# Patient Record
Sex: Female | Born: 1950
Health system: Southern US, Community
[De-identification: ages and names within clinical notes are randomized; demographics above are authoritative.]

## PROBLEM LIST (undated history)

## (undated) DIAGNOSIS — K5792 Diverticulitis of intestine, part unspecified, without perforation or abscess without bleeding: Secondary | ICD-10-CM

## (undated) DIAGNOSIS — R0602 Shortness of breath: Secondary | ICD-10-CM

## (undated) DIAGNOSIS — M79672 Pain in left foot: Secondary | ICD-10-CM

## (undated) DIAGNOSIS — Z86718 Personal history of other venous thrombosis and embolism: Secondary | ICD-10-CM

## (undated) DIAGNOSIS — E559 Vitamin D deficiency, unspecified: Secondary | ICD-10-CM

## (undated) DIAGNOSIS — Z923 Personal history of irradiation: Secondary | ICD-10-CM

## (undated) DIAGNOSIS — D649 Anemia, unspecified: Secondary | ICD-10-CM

## (undated) DIAGNOSIS — K5900 Constipation, unspecified: Secondary | ICD-10-CM

## (undated) DIAGNOSIS — Z87440 Personal history of urinary (tract) infections: Secondary | ICD-10-CM

## (undated) DIAGNOSIS — Z8742 Personal history of other diseases of the female genital tract: Secondary | ICD-10-CM

## (undated) DIAGNOSIS — I071 Rheumatic tricuspid insufficiency: Secondary | ICD-10-CM

## (undated) DIAGNOSIS — O2230 Deep phlebothrombosis in pregnancy, unspecified trimester: Secondary | ICD-10-CM

## (undated) DIAGNOSIS — I351 Nonrheumatic aortic (valve) insufficiency: Secondary | ICD-10-CM

## (undated) DIAGNOSIS — M5136 Other intervertebral disc degeneration, lumbar region: Secondary | ICD-10-CM

## (undated) DIAGNOSIS — I5189 Other ill-defined heart diseases: Principal | ICD-10-CM

## (undated) DIAGNOSIS — E669 Obesity, unspecified: Secondary | ICD-10-CM

## (undated) DIAGNOSIS — M1990 Unspecified osteoarthritis, unspecified site: Secondary | ICD-10-CM

## (undated) DIAGNOSIS — G8929 Other chronic pain: Secondary | ICD-10-CM

## (undated) DIAGNOSIS — M2550 Pain in unspecified joint: Secondary | ICD-10-CM

## (undated) DIAGNOSIS — M25569 Pain in unspecified knee: Secondary | ICD-10-CM

## (undated) DIAGNOSIS — M79671 Pain in right foot: Secondary | ICD-10-CM

## (undated) DIAGNOSIS — I340 Nonrheumatic mitral (valve) insufficiency: Secondary | ICD-10-CM

## (undated) DIAGNOSIS — C50919 Malignant neoplasm of unspecified site of unspecified female breast: Secondary | ICD-10-CM

## (undated) DIAGNOSIS — K829 Disease of gallbladder, unspecified: Secondary | ICD-10-CM

## (undated) DIAGNOSIS — R7303 Prediabetes: Secondary | ICD-10-CM

## (undated) HISTORY — DX: Malignant neoplasm of unspecified site of unspecified female breast: C50.919

## (undated) HISTORY — DX: Disease of gallbladder, unspecified: K82.9

## (undated) HISTORY — DX: Pain in right foot: M79.672

## (undated) HISTORY — DX: Pain in unspecified knee: M25.569

## (undated) HISTORY — DX: Shortness of breath: R06.02

## (undated) HISTORY — PX: COLONOSCOPY: SHX174

## (undated) HISTORY — DX: Vitamin D deficiency, unspecified: E55.9

## (undated) HISTORY — DX: Prediabetes: R73.03

## (undated) HISTORY — DX: Pain in right foot: M79.671

## (undated) HISTORY — DX: Other ill-defined heart diseases: I51.89

## (undated) HISTORY — DX: Constipation, unspecified: K59.00

## (undated) HISTORY — DX: Personal history of other venous thrombosis and embolism: Z86.718

## (undated) HISTORY — DX: Pain in unspecified joint: M25.50

---

## 1975-07-27 HISTORY — PX: HERNIA REPAIR: SHX51

## 1985-07-26 HISTORY — PX: OTHER SURGICAL HISTORY: SHX169

## 1985-07-26 HISTORY — PX: ABDOMINAL HYSTERECTOMY: SHX81

## 1991-07-27 HISTORY — PX: CHOLECYSTECTOMY: SHX55

## 1997-07-23 DIAGNOSIS — C50919 Malignant neoplasm of unspecified site of unspecified female breast: Secondary | ICD-10-CM | POA: Insufficient documentation

## 1997-07-23 HISTORY — DX: Malignant neoplasm of unspecified site of unspecified female breast: C50.919

## 1997-07-26 HISTORY — PX: BREAST SURGERY: SHX581

## 1997-10-24 ENCOUNTER — Encounter: Admission: RE | Admit: 1997-10-24 | Discharge: 1998-01-22 | Payer: Self-pay | Admitting: Radiation Oncology

## 1997-11-06 ENCOUNTER — Other Ambulatory Visit: Admission: RE | Admit: 1997-11-06 | Discharge: 1997-11-06 | Payer: Self-pay | Admitting: Obstetrics and Gynecology

## 1998-12-22 ENCOUNTER — Other Ambulatory Visit: Admission: RE | Admit: 1998-12-22 | Discharge: 1998-12-22 | Payer: Self-pay | Admitting: Obstetrics and Gynecology

## 1999-11-17 ENCOUNTER — Other Ambulatory Visit: Admission: RE | Admit: 1999-11-17 | Discharge: 1999-11-17 | Payer: Self-pay | Admitting: Obstetrics and Gynecology

## 2000-03-31 ENCOUNTER — Ambulatory Visit (HOSPITAL_COMMUNITY): Admission: RE | Admit: 2000-03-31 | Discharge: 2000-03-31 | Payer: Self-pay | Admitting: Gastroenterology

## 2000-11-23 ENCOUNTER — Other Ambulatory Visit: Admission: RE | Admit: 2000-11-23 | Discharge: 2000-11-23 | Payer: Self-pay | Admitting: Obstetrics and Gynecology

## 2000-11-24 ENCOUNTER — Encounter: Payer: Self-pay | Admitting: Obstetrics and Gynecology

## 2000-11-24 ENCOUNTER — Encounter: Admission: RE | Admit: 2000-11-24 | Discharge: 2000-11-24 | Payer: Self-pay | Admitting: Obstetrics and Gynecology

## 2001-05-24 ENCOUNTER — Encounter: Payer: Self-pay | Admitting: Internal Medicine

## 2001-05-24 ENCOUNTER — Encounter: Admission: RE | Admit: 2001-05-24 | Discharge: 2001-05-24 | Payer: Self-pay | Admitting: Internal Medicine

## 2001-11-28 ENCOUNTER — Encounter: Admission: RE | Admit: 2001-11-28 | Discharge: 2001-11-28 | Payer: Self-pay | Admitting: Oncology

## 2001-11-28 ENCOUNTER — Encounter: Payer: Self-pay | Admitting: Oncology

## 2002-05-09 ENCOUNTER — Other Ambulatory Visit: Admission: RE | Admit: 2002-05-09 | Discharge: 2002-05-09 | Payer: Self-pay | Admitting: Obstetrics and Gynecology

## 2002-11-30 ENCOUNTER — Encounter: Payer: Self-pay | Admitting: Oncology

## 2002-11-30 ENCOUNTER — Encounter: Admission: RE | Admit: 2002-11-30 | Discharge: 2002-11-30 | Payer: Self-pay | Admitting: Oncology

## 2003-05-15 ENCOUNTER — Other Ambulatory Visit: Admission: RE | Admit: 2003-05-15 | Discharge: 2003-05-15 | Payer: Self-pay | Admitting: Obstetrics and Gynecology

## 2003-12-02 ENCOUNTER — Encounter: Admission: RE | Admit: 2003-12-02 | Discharge: 2003-12-02 | Payer: Self-pay | Admitting: Oncology

## 2004-12-02 ENCOUNTER — Encounter: Admission: RE | Admit: 2004-12-02 | Discharge: 2004-12-02 | Payer: Self-pay | Admitting: Obstetrics and Gynecology

## 2005-12-06 ENCOUNTER — Encounter: Admission: RE | Admit: 2005-12-06 | Discharge: 2005-12-06 | Payer: Self-pay | Admitting: Obstetrics and Gynecology

## 2006-11-07 ENCOUNTER — Encounter: Admission: RE | Admit: 2006-11-07 | Discharge: 2006-11-07 | Payer: Self-pay | Admitting: Internal Medicine

## 2006-12-08 ENCOUNTER — Encounter: Admission: RE | Admit: 2006-12-08 | Discharge: 2006-12-08 | Payer: Self-pay | Admitting: Obstetrics and Gynecology

## 2007-12-11 ENCOUNTER — Encounter: Admission: RE | Admit: 2007-12-11 | Discharge: 2007-12-11 | Payer: Self-pay | Admitting: Internal Medicine

## 2008-12-11 ENCOUNTER — Encounter: Admission: RE | Admit: 2008-12-11 | Discharge: 2008-12-11 | Payer: Self-pay | Admitting: Internal Medicine

## 2009-12-15 ENCOUNTER — Encounter: Admission: RE | Admit: 2009-12-15 | Discharge: 2009-12-15 | Payer: Self-pay | Admitting: Obstetrics and Gynecology

## 2010-08-16 ENCOUNTER — Encounter: Payer: Self-pay | Admitting: Obstetrics and Gynecology

## 2010-11-09 ENCOUNTER — Other Ambulatory Visit: Payer: Self-pay | Admitting: Internal Medicine

## 2010-11-09 DIAGNOSIS — Z1231 Encounter for screening mammogram for malignant neoplasm of breast: Secondary | ICD-10-CM

## 2010-12-17 ENCOUNTER — Ambulatory Visit: Payer: Self-pay

## 2010-12-24 ENCOUNTER — Ambulatory Visit
Admission: RE | Admit: 2010-12-24 | Discharge: 2010-12-24 | Disposition: A | Discharge status: Home / Self Care | Payer: PRIVATE HEALTH INSURANCE | Source: Ambulatory Visit | Attending: Internal Medicine | Admitting: Internal Medicine

## 2010-12-24 DIAGNOSIS — Z1231 Encounter for screening mammogram for malignant neoplasm of breast: Secondary | ICD-10-CM

## 2011-11-09 ENCOUNTER — Encounter: Payer: Self-pay | Admitting: Obstetrics and Gynecology

## 2011-11-09 ENCOUNTER — Ambulatory Visit (INDEPENDENT_AMBULATORY_CARE_PROVIDER_SITE_OTHER): Payer: PRIVATE HEALTH INSURANCE | Admitting: Obstetrics and Gynecology

## 2011-11-09 VITALS — BP 108/76 | HR 60 | Ht 65.0 in | Wt 214.0 lb

## 2011-11-09 DIAGNOSIS — N390 Urinary tract infection, site not specified: Secondary | ICD-10-CM

## 2011-11-09 NOTE — Progress Notes (Signed)
Subjective:    Melissa [name] is a 61 y.o. female [phone] who presents for annual exam.  The patient has no complaints today. She has a history of chronic UTI's but does well on prophylactic trimethoprim  The following portions of the patient's history were reviewed and updated as appropriate: allergies, current medications, past family history, past medical history, past social history, past surgical history and problem list.  Review of Systems Pertinent items are noted in HPI. Gastrointestinal:No change in bowel habits, no abdominal pain, no rectal bleeding Genitourinary:negative for dysuria, frequency, hematuria, nocturia and urinary incontinence    Objective:     BP 108/76  Pulse 60  Ht 5\' 5"  (1.651 m)  Wt 214 lb (97.07 kg)  BMI 35.61 kg/m2  Weight:  Wt Readings from Last 1 Encounters:  11/09/11 214 lb (97.07 kg)     BMI: Body mass index is 35.61 kg/(m^2). General Appearance: Alert, appropriate appearance for age. No acute distress HEENT: Grossly normal Neck / Thyroid: Supple, no masses, nodes or enlargement Lungs: clear to auscultation bilaterally Back: No CVA tenderness Breast Exam: Right without  Masses, tenderness of skin changes.  Left s/p lumpectomy and radiation Cardiovascular: Regular rate and rhythm. S1, S2, no murmur Gastrointestinal: Soft, non-tender, no masses or organomegaly Pelvic Exam: External genitalia: normal general appearance Vaginal: normal mucosa without prolapse or lesions and vaginal vault, well healed and suspended Adnexa: non palpable Rectal: no masses Exam limited by body habitus Rectovaginal: normal rectal, no masses Lymphatic Exam: Non-palpable nodes in neck, clavicular, axillary, or inguinal regions Skin: no rash or abnormalities Neurologic: Normal gait and speech, no tremor  Psychiatric: Alert and oriented, appropriate affect.    Urinalysis:Not done      Assessment:    Normal post hysterectomy exam  History of L breast cancer, now  NED   Plan:    All questions answered. Follow up in 1 year. Mammogram.   Follow-up:  for annual exam

## 2011-11-10 ENCOUNTER — Encounter: Payer: Self-pay | Admitting: Obstetrics and Gynecology

## 2011-11-23 ENCOUNTER — Other Ambulatory Visit: Payer: Self-pay | Admitting: Internal Medicine

## 2011-11-23 DIAGNOSIS — Z1231 Encounter for screening mammogram for malignant neoplasm of breast: Secondary | ICD-10-CM

## 2011-12-27 ENCOUNTER — Ambulatory Visit
Admission: RE | Admit: 2011-12-27 | Discharge: 2011-12-27 | Disposition: A | Discharge status: Home / Self Care | Payer: PRIVATE HEALTH INSURANCE | Source: Ambulatory Visit | Attending: Internal Medicine | Admitting: Internal Medicine

## 2011-12-27 DIAGNOSIS — Z1231 Encounter for screening mammogram for malignant neoplasm of breast: Secondary | ICD-10-CM

## 2011-12-30 ENCOUNTER — Other Ambulatory Visit: Payer: Self-pay | Admitting: Internal Medicine

## 2011-12-30 DIAGNOSIS — R928 Other abnormal and inconclusive findings on diagnostic imaging of breast: Secondary | ICD-10-CM

## 2012-01-04 ENCOUNTER — Ambulatory Visit
Admission: RE | Admit: 2012-01-04 | Discharge: 2012-01-04 | Disposition: A | Discharge status: Home / Self Care | Payer: PRIVATE HEALTH INSURANCE | Source: Ambulatory Visit | Attending: Internal Medicine | Admitting: Internal Medicine

## 2012-01-04 ENCOUNTER — Other Ambulatory Visit: Payer: Self-pay | Admitting: Internal Medicine

## 2012-01-04 DIAGNOSIS — R928 Other abnormal and inconclusive findings on diagnostic imaging of breast: Secondary | ICD-10-CM

## 2012-01-04 DIAGNOSIS — C50919 Malignant neoplasm of unspecified site of unspecified female breast: Secondary | ICD-10-CM | POA: Insufficient documentation

## 2012-01-04 HISTORY — PX: BREAST BIOPSY: SHX20

## 2012-01-04 HISTORY — DX: Malignant neoplasm of unspecified site of unspecified female breast: C50.919

## 2012-01-05 ENCOUNTER — Other Ambulatory Visit: Payer: Self-pay | Admitting: Internal Medicine

## 2012-01-05 ENCOUNTER — Telehealth (INDEPENDENT_AMBULATORY_CARE_PROVIDER_SITE_OTHER): Payer: Self-pay | Admitting: General Surgery

## 2012-01-05 DIAGNOSIS — C50912 Malignant neoplasm of unspecified site of left female breast: Secondary | ICD-10-CM

## 2012-01-05 NOTE — Telephone Encounter (Signed)
Per Dr Jamey Ripa, can see patient on Friday. I called Leigh at [facility] and left message for her to call me back. Wanted to make sure she new diagnosis. MR not scheduled until 01/14/12.

## 2012-01-05 NOTE — Telephone Encounter (Signed)
Do you have anywhere you would like to see this patient sooner?

## 2012-01-05 NOTE — Telephone Encounter (Signed)
Message copied by Liliana Cline on Wed Jan 05, 2012 11:19 AM ------      Message from: Marnette Burgess      Created: Wed Jan 05, 2012 10:22 AM      Contact: [phone]       Patient has an new br ca appt on 01/25/12, can patient be seen sooner, if so, please call pt.

## 2012-01-06 NOTE — Telephone Encounter (Signed)
Appt moved , spoke with patient.

## 2012-01-07 ENCOUNTER — Ambulatory Visit (INDEPENDENT_AMBULATORY_CARE_PROVIDER_SITE_OTHER): Payer: PRIVATE HEALTH INSURANCE | Admitting: Surgery

## 2012-01-07 ENCOUNTER — Other Ambulatory Visit: Payer: Self-pay | Admitting: *Deleted

## 2012-01-07 ENCOUNTER — Encounter (INDEPENDENT_AMBULATORY_CARE_PROVIDER_SITE_OTHER): Payer: Self-pay | Admitting: Surgery

## 2012-01-07 VITALS — BP 94/58 | HR 59 | Temp 97.0°F | Ht 66.0 in | Wt 211.6 lb

## 2012-01-07 DIAGNOSIS — C50919 Malignant neoplasm of unspecified site of unspecified female breast: Secondary | ICD-10-CM

## 2012-01-07 DIAGNOSIS — C50912 Malignant neoplasm of unspecified site of left female breast: Secondary | ICD-10-CM | POA: Insufficient documentation

## 2012-01-07 NOTE — Progress Notes (Signed)
NAMETIONDRA FANG DOB: [birth year] MRN: [id number]                                                                                      DATE: 01/07/2012  PCP: Pearla Dubonnet, MD Referring Provider: Pearla Dubonnet, MD  IMPRESSION:  Breast cancer, left, upper outer quadrant, recurrent,receptors pending.  PLAN:   She is having an MRI next week. Her original cancer was 14 years ago, at age about 34. Treated with lumpectomy and radiation,so she likely will need mastectomy, but MRI pending and she will need genetic testing as well.Will also arrange plastic surgery eval                 CC:  Chief Complaint  Patient presents with  . Breast Cancer    left, recurrent    HPI:  Melissa [name] is a 61 y.o.  female who presents for evaluation of Newly diagnosed recurrent IDC, left breast, just below lumpectomy scar. Found on mammogram, not apparent to the patient  PMH:  has a past medical history of Cancer.  PSH:   has past surgical history that includes Cesarean section; Abdominal hysterectomy; Hernia repair (1977); Cholecystectomy (1993); Breast surgery (1999); and ovary removed (1987).  ALLERGIES:  No Known Allergies  MEDICATIONS: Current outpatient prescriptions:Multiple Vitamin (MULITIVITAMIN WITH MINERALS) TABS, Take 1 tablet by mouth daily., Disp: , Rfl: ;  trimethoprim (TRIMPEX) 100 MG tablet, Take 100 mg by mouth 2 (two) times daily., Disp: , Rfl:   ROS: She has filled out our 12 point review of systems and it is negative.  EXAM:   VS: BP 94/58  Pulse 59  Temp 97 F (36.1 C) (Temporal)  Ht 5\' 6"  (1.676 m)  Wt 211 lb 9.6 oz (95.981 kg)  BMI 34.15 kg/m2  SpO2 98% Gen: Alert, oreinted, NAD Breasts: Right is normal. Left shows a scar in high UOQ with an eccymosis near the medial end, just inferior to the scar. The remainder of the breast is normal.  Lympahtics: No axillary or supraclavicular adenopathy noted  DATA REVIEWED:  Path and imaging reviewed. Await old  chart     Khloei Spiker J 01/07/2012  CC: Pearla Dubonnet, MD, Pearla Dubonnet, MD

## 2012-01-07 NOTE — Patient Instructions (Signed)
We will arrange a consultation with plastic surgery, medical oncology and genetic counseling.

## 2012-01-07 NOTE — Addendum Note (Signed)
Addended byLiliana Cline on: 01/07/2012 03:48 PM   Modules accepted: Orders

## 2012-01-10 ENCOUNTER — Telehealth: Payer: Self-pay | Admitting: Oncology

## 2012-01-10 NOTE — Telephone Encounter (Signed)
S/w pt today re appt for 6/19 @ 8:45 am.

## 2012-01-12 ENCOUNTER — Telehealth: Payer: Self-pay | Admitting: *Deleted

## 2012-01-12 ENCOUNTER — Ambulatory Visit: Payer: PRIVATE HEALTH INSURANCE

## 2012-01-12 ENCOUNTER — Telehealth: Payer: Self-pay | Admitting: Oncology

## 2012-01-12 ENCOUNTER — Ambulatory Visit (HOSPITAL_BASED_OUTPATIENT_CLINIC_OR_DEPARTMENT_OTHER): Payer: PRIVATE HEALTH INSURANCE | Admitting: Oncology

## 2012-01-12 VITALS — BP 110/73 | HR 60 | Temp 97.2°F | Ht 66.0 in | Wt 215.7 lb

## 2012-01-12 DIAGNOSIS — C50912 Malignant neoplasm of unspecified site of left female breast: Secondary | ICD-10-CM

## 2012-01-12 DIAGNOSIS — C50919 Malignant neoplasm of unspecified site of unspecified female breast: Secondary | ICD-10-CM

## 2012-01-12 NOTE — Telephone Encounter (Signed)
Gv pt aptp for [id number].  scheduled pt for ct and bone scan on 06/26 @ [facility]

## 2012-01-12 NOTE — Telephone Encounter (Signed)
Left message for pt to return my call so I can schedule a genetic appt.  

## 2012-01-12 NOTE — Progress Notes (Signed)
[facility]    OFFICE PROGRESS NOTE   INTERVAL HISTORY:   [name] was last seen at the cancer Center in 2005. She reports feeling well. She had a routine screening mammogram on 12/27/2011. A possible mass was noted in the left breast. She returned on 01/04/2012 4 additional views of the left breast. A spiculated mass was noted anterior to the lumpectomy site at the 1:00 position near the left axilla. A 1 cm nodule was palpated at 1:00 just inferior to the lumpectomy scar. An ultrasound showed an irregular mass in this location measuring 1 x 0.9 x 0.9 cm. No abnormal left axillary lymph nodes were notified. She underwent ultrasound-guided core biopsy of the left breast mass on 01/05/2012. The pathology confirmed invasive ductal carcinoma and DCIS, ER 100, PR 67, Ki-67 35.  She saw Dr. Jamey [name] on 01/07/2012 and has been scheduled for a bilateral breast MRI on 01/14/2012. Dr. Jamey [name] plans to schedule a mastectomy. She is scheduled for an appointment with Dr. Kelly [name] on 01/25/2012 to discuss breast reconstruction options.  Melissa [name] feels well. She had not noted a palpable change in the left breast prior to the biopsy procedure. Following the biopsy she noted numbness and "tingling" in the left upper arm. This has persisted over the past several days. No arm weakness. She lifted weight earlier today. No neck pain.   Past medical history: 1. Chronic bladder infections-maintained on prophylactic trimethoprim 2. G3 P3, hot flashes since starting tamoxifen in January 1999, now improved 3. 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 "Phlebitis "while pregnant in 1987  Past surgical history: 1. Hysterectomy and unilateral oophorectomy in 1987 2. Cholecystectomy 3. Umbilical hernia repair  Family history: A paternal aunt had breast cancer in her 63s, her mother died of colon cancer at age 68, for a maternal aunts had colon cancer. Her maternal grandfather had prostate  cancer.  Review of systems: Positives: Intermittent urinary tract infections and hematuria or not taking trimethoprim, left arm tingling and Numbness since undergoing a needle biopsy on 01/05/2012-the tingling and numbness is in the left upper arm  Objective:  Vital signs in last 24 hours:  Blood pressure 110/73, pulse 60, temperature 97.2 F (36.2 C), temperature source Oral, height 5\' 6"  (1.676 m), weight 215 lb 11.2 oz (97.841 kg).    HEENT: Neck without mass or tenderness Lymphatics: No cervical, supraclavicular, or inguinal nodes. No right axillary nodes. Pea-sized mobile left axillary node Resp: Lungs clear bilaterally Cardio: Regular rate and rhythm GI: No hepatosplenomegaly, nontender Vascular: No leg edema Neuro: The motor exam appears intact in the left arm , alert and oriented Skin: No rash Breast: Status post left lumpectomy. There is a firm fullness in the upper-outer left breast tissue medial to the superior aspect of the lumpectomy scar. No other mass in either breast.  Muscular skeletal: No neck tenderness, examination of the left upper arm is unremarkable    Lab Results:  CBC, chemistry panel, and a CA 27.29 will be obtained in one week.  Medications: I have reviewed the patient's current medications.  Assessment/Plan:  1. Stage I (T1 N0) left-sided breast cancer diagnosed in January of 1999, ER positive, PR positive, HER-2 negative, status post a left lumpectomy, left axillary lymph node dissection, and left breast radiation. She completed 5 years of adjuvant tamoxifen therapy in January of 2004.  2. Recurrent invasive breast cancer near the left lumpectomy scar-confirmed on a needle core biopsy 01/05/2012, the pathology is consistent  with invasive breast cancer, ER positive, PR positive  Disposition:  Melissa [name] has been diagnosed with recurrent breast cancer. I discussed the diagnosis, prognosis, and treatment options with her today.  There is no clinical  or physical exam evidence of distant metastatic disease. She appears to have a local recurrence of breast cancer after breast conserving therapy. I expect her prognosis is better than the average patient with a local recurrence of breast cancer due to the long disease-free interval and the recurrence in a post lumpectomy breast.  She is scheduled for a bilateral breast MRI on 01/14/2012. She will complete a staging evaluation with CT scans of the chest, abdomen, and pelvis in addition to a bone scan. We will obtain a chemistry panel and CA 27-29 when she returns for the CT scan.  Melissa [name] will return for an office visit on 01/25/2012.  We will make a decision on systemic therapy based on the final pathology from the planned mastectomy.   Thornton Papas, MD  01/12/2012  12:52 PM

## 2012-01-14 ENCOUNTER — Other Ambulatory Visit: Payer: PRIVATE HEALTH INSURANCE

## 2012-01-14 ENCOUNTER — Ambulatory Visit
Admission: RE | Admit: 2012-01-14 | Discharge: 2012-01-14 | Disposition: A | Discharge status: Home / Self Care | Payer: PRIVATE HEALTH INSURANCE | Source: Ambulatory Visit | Attending: Internal Medicine | Admitting: Internal Medicine

## 2012-01-14 DIAGNOSIS — C50912 Malignant neoplasm of unspecified site of left female breast: Secondary | ICD-10-CM

## 2012-01-14 MED ORDER — GADOBENATE DIMEGLUMINE 529 MG/ML IV SOLN
20.0000 mL | Freq: Once | INTRAVENOUS | Status: AC | PRN
  Administered 2012-01-14: 20 mL via INTRAVENOUS

## 2012-01-19 ENCOUNTER — Encounter (HOSPITAL_COMMUNITY)
Admission: RE | Admit: 2012-01-19 | Discharge: 2012-01-19 | Disposition: A | Discharge status: Home / Self Care | Payer: PRIVATE HEALTH INSURANCE | Source: Ambulatory Visit | Attending: Oncology | Admitting: Oncology

## 2012-01-19 ENCOUNTER — Telehealth: Payer: Self-pay | Admitting: *Deleted

## 2012-01-19 ENCOUNTER — Other Ambulatory Visit (HOSPITAL_BASED_OUTPATIENT_CLINIC_OR_DEPARTMENT_OTHER): Payer: PRIVATE HEALTH INSURANCE | Admitting: Lab

## 2012-01-19 DIAGNOSIS — C50912 Malignant neoplasm of unspecified site of left female breast: Secondary | ICD-10-CM

## 2012-01-19 DIAGNOSIS — M5380 Other specified dorsopathies, site unspecified: Secondary | ICD-10-CM | POA: Insufficient documentation

## 2012-01-19 DIAGNOSIS — C50919 Malignant neoplasm of unspecified site of unspecified female breast: Secondary | ICD-10-CM | POA: Insufficient documentation

## 2012-01-19 DIAGNOSIS — K7689 Other specified diseases of liver: Secondary | ICD-10-CM | POA: Insufficient documentation

## 2012-01-19 DIAGNOSIS — C50419 Malignant neoplasm of upper-outer quadrant of unspecified female breast: Secondary | ICD-10-CM

## 2012-01-19 LAB — CBC WITH DIFFERENTIAL/PLATELET
BASO%: 0.9 % (ref 0.0–2.0)
Basophils Absolute: 0 10*3/uL (ref 0.0–0.1)
EOS%: 1.3 % (ref 0.0–7.0)
HGB: 12.7 g/dL (ref 11.6–15.9)
LYMPH%: 48 % (ref 14.0–49.7)
MCH: 27.8 pg (ref 25.1–34.0)
MCHC: 32.3 g/dL (ref 31.5–36.0)
MCV: 86.2 fL (ref 79.5–101.0)
MONO%: 7.3 % (ref 0.0–14.0)
Platelets: 223 10*3/uL (ref 145–400)
RBC: 4.57 10*6/uL (ref 3.70–5.45)
WBC: 4.8 10*3/uL (ref 3.9–10.3)
lymph#: 2.3 10*3/uL (ref 0.9–3.3)

## 2012-01-19 LAB — CMP (CANCER CENTER ONLY)
ALT(SGPT): 22 U/L (ref 10–47)
AST: 30 U/L (ref 11–38)
Alkaline Phosphatase: 79 U/L (ref 26–84)
BUN, Bld: 18 mg/dL (ref 7–22)
Chloride: 100 mEq/L (ref 98–108)
Creat: 0.9 mg/dl (ref 0.6–1.2)
Potassium: 4.4 mEq/L (ref 3.3–4.7)

## 2012-01-19 LAB — CANCER ANTIGEN 27.29: CA 27.29: 27 U/mL (ref 0–39)

## 2012-01-19 MED ORDER — IOHEXOL 300 MG/ML  SOLN
100.0000 mL | Freq: Once | INTRAMUSCULAR | Status: AC | PRN
  Administered 2012-01-19: 100 mL via INTRAVENOUS

## 2012-01-19 MED ORDER — TECHNETIUM TC 99M MEDRONATE IV KIT: 25.0000 | PACK | Freq: Once | INTRAVENOUS | Status: DC | PRN

## 2012-01-19 NOTE — Telephone Encounter (Signed)
Pt returned my call and I confirmed 03/06/12 appt w pt.  Emailed Jade at [facility] to make her aware.

## 2012-01-19 NOTE — Telephone Encounter (Signed)
Left message for pt to return my call so I can schedule a genetic appt.  

## 2012-01-20 ENCOUNTER — Telehealth: Payer: Self-pay | Admitting: Oncology

## 2012-01-20 NOTE — Telephone Encounter (Signed)
called pts r/s appt on 07/02 to 07/01 asked pt to rtn call to confirm changes

## 2012-01-21 ENCOUNTER — Telehealth: Payer: Self-pay | Admitting: Oncology

## 2012-01-21 NOTE — Telephone Encounter (Signed)
pt rtn call and confirmed appt for 07/01

## 2012-01-24 ENCOUNTER — Telehealth: Payer: Self-pay | Admitting: Oncology

## 2012-01-24 ENCOUNTER — Ambulatory Visit: Admit: 2012-01-24 | Payer: Self-pay | Admitting: Surgery

## 2012-01-24 ENCOUNTER — Ambulatory Visit (HOSPITAL_BASED_OUTPATIENT_CLINIC_OR_DEPARTMENT_OTHER): Payer: PRIVATE HEALTH INSURANCE | Admitting: Oncology

## 2012-01-24 VITALS — BP 106/65 | HR 64 | Temp 97.9°F | Ht 66.0 in | Wt 212.5 lb

## 2012-01-24 DIAGNOSIS — C50912 Malignant neoplasm of unspecified site of left female breast: Secondary | ICD-10-CM

## 2012-01-24 DIAGNOSIS — C50919 Malignant neoplasm of unspecified site of unspecified female breast: Secondary | ICD-10-CM

## 2012-01-24 SURGERY — SIMPLE MASTECTOMY
Anesthesia: General | Site: Breast | Laterality: Left

## 2012-01-24 NOTE — Telephone Encounter (Signed)
Gave pt appt for August 5th md only

## 2012-01-24 NOTE — Progress Notes (Signed)
   [facility]    OFFICE PROGRESS NOTE   INTERVAL HISTORY:   She returns as scheduled. She is scheduled to see Dr. Kelly Splinter on 01/25/2012.  Objective:  Vital signs in last 24 hours:  Blood pressure 106/65, pulse 64, temperature 97.9 F (36.6 C), temperature source Oral, height 5\' 6"  (1.676 m), weight 212 lb 8 oz (96.389 kg).    Musculoskeletal: No tenderness or mass at the sternal Breast: There are a few areas of firm nodularity in the breast tissue inferior to the lumpectomy scar, no discrete mass   Lab Results:  Lab Results  Component Value Date   WBC 4.8 01/19/2012   HGB 12.7 01/19/2012   HCT 39.4 01/19/2012   MCV 86.2 01/19/2012   PLT 223 01/19/2012   CA 27.29- 27 on 01/19/2012  X-rays: Bilateral breast MRI on 01/17/2012-1.1 x 1.3 x 1.5 cm spiculated mass for posteriorly in the upper outer quadrant of the left breast. No abnormality in the right breast. No enlarged axillary or internal mammary nodes. Well-circumscribed lesions in the liver are likely cysts.  Bone scan on 01/19/2012-focus of very subtle uptake in the inferior sternum, no bony abnormality identified at this location on the CT. Increased uptake in the right aspect of the lower thoracic spine at the T10-T11 level, corresponds to very prominent right-sided  Paravertebral spurring at T10 and T11. Subtle heterogenous uptake in the lumbar spine likely attributable to facet degeneration visible on the CT scan.   CT scans of the chest, abdomen, and pelvis on 01/19/2012-in the upper outer left breast there is a soft tissue nodule measuring 1 cm, no evidence for metastatic disease in the chest. Several fluid attenuation structure throughout the liver within both lobes likely represent cysts. No evidence of metastatic disease in the abdomen or pelvis the     Medications: I have reviewed the patient's current medications.  Assessment/Plan: 1.Stage I (T1 N0) left-sided breast cancer diagnosed in January  of 1999, ER positive, PR positive, HER-2 negative, status post a left lumpectomy, left axillary lymph node dissection, and left breast radiation. She completed 5 years of adjuvant tamoxifen therapy in January of 2004.  2. Recurrent invasive breast cancer near the left lumpectomy scar-confirmed on a needle core biopsy 01/05/2012, the pathology is consistent with invasive breast cancer, ER positive, PR positive.             -breast MRI 01/17/2012 confirmed an isolated mass in the upper outer left breast         -bone scan on 01/19/2012-negative aside from an area of very subtle uptake in the inferior sternum without a CT correlate   -staging CTs of the chest, abdomen, and pelvis on 01/19/2012-negative for metastatic disease    Disposition:  She appears to have a local recurrence of breast cancer. She will see Dr. Jamey Ripa to plan a mastectomy procedure. Ms. Plunk is scheduled to see the plastic surgeon on 01/25/2012. I reviewed the staging studies with Ms. Barbier today. She has no evidence of metastatic disease based on her history, physical exam, and imaging studies to date. I will recommend hormonal therapy to begin after the mastectomy.  She will return for an office visit on 02/28/2012. In the interim I will arrange for her case to be presented at the breast tumor conference and I will discuss her case with my oncology colleagues.   Thornton Papas, MD  01/24/2012  5:24 PM

## 2012-01-25 ENCOUNTER — Ambulatory Visit: Payer: PRIVATE HEALTH INSURANCE | Admitting: Oncology

## 2012-01-25 ENCOUNTER — Encounter (INDEPENDENT_AMBULATORY_CARE_PROVIDER_SITE_OTHER): Payer: PRIVATE HEALTH INSURANCE | Admitting: Surgery

## 2012-01-25 ENCOUNTER — Telehealth: Payer: Self-pay | Admitting: *Deleted

## 2012-01-25 NOTE — Telephone Encounter (Signed)
Left VM for Melissa [name] w/[facility] requesting to add patient to 7/3 or 7/10 Breast Conference. Called Alinda Money at [facility] and requested case [phone] be tested for Her-2 Neu per Dr. Truett Perna request (left breast biopsy).

## 2012-01-28 ENCOUNTER — Telehealth: Payer: Self-pay | Admitting: *Deleted

## 2012-01-28 NOTE — Telephone Encounter (Signed)
left voice message to inform the patient of the new date and time on 02-28-2012 at 3:45pm

## 2012-02-01 ENCOUNTER — Encounter (INDEPENDENT_AMBULATORY_CARE_PROVIDER_SITE_OTHER): Payer: Self-pay | Admitting: Surgery

## 2012-02-01 ENCOUNTER — Encounter (HOSPITAL_COMMUNITY): Payer: Self-pay | Admitting: Pharmacy Technician

## 2012-02-01 ENCOUNTER — Ambulatory Visit (INDEPENDENT_AMBULATORY_CARE_PROVIDER_SITE_OTHER): Payer: BC Managed Care – PPO | Admitting: Surgery

## 2012-02-01 VITALS — BP 112/82 | HR 70 | Temp 98.4°F | Ht 66.5 in | Wt 214.6 lb

## 2012-02-01 DIAGNOSIS — C50919 Malignant neoplasm of unspecified site of unspecified female breast: Secondary | ICD-10-CM

## 2012-02-01 DIAGNOSIS — C50912 Malignant neoplasm of unspecified site of left female breast: Secondary | ICD-10-CM

## 2012-02-01 NOTE — Progress Notes (Signed)
Chief complaint: Preop discussion prior to breast surgery  History of present illness: This patient presented a few weeks ago with a recurrence of her left breast cancer which is in the upper outer quadrant, basically in the axilla. Core biopsy had shown an invasive ductal carcinoma receptor positive. Metastatic workup has been negative and the breast MRI has shown no other lesions. She has had a plastic surgical consultation for possible reconstruction after mastectomy but has decided against having a reconstruction.  Family history view systems are all noted and not redictated here.  Exam: Gen.: Patient is alert oriented and healthy-appearing Vital signs: Breasts: She has a hard area in the lumpectomy site in the upper outer quadrant axillary area consistent with a recurrent cancer.  Data reviewed: As noted in history of present illness  Impression: Local, left breast cancer, invasive ductal, receptor positive.  Plan: We'll proceed to schedule her for a mastectomy. We did discuss at some length the surgery and the alternative of a possible repeat lumpectomy since this is so high in the axilla. However there is no data to support doing that. I think all questions been answered. We'll proceed to schedule her at her convenience hopefully fairly soon. 

## 2012-02-01 NOTE — Patient Instructions (Signed)
We will schedule surgery to remove your left breast

## 2012-02-09 ENCOUNTER — Encounter (HOSPITAL_COMMUNITY): Payer: Self-pay

## 2012-02-09 ENCOUNTER — Encounter (HOSPITAL_COMMUNITY)
Admission: RE | Admit: 2012-02-09 | Discharge: 2012-02-09 | Disposition: A | Discharge status: Home / Self Care | Payer: BC Managed Care – PPO | Source: Ambulatory Visit | Attending: Surgery | Admitting: Surgery

## 2012-02-09 HISTORY — DX: Anemia, unspecified: D64.9

## 2012-02-09 HISTORY — DX: Personal history of urinary (tract) infections: Z87.440

## 2012-02-09 LAB — CBC
Hemoglobin: 12.8 g/dL (ref 12.0–15.0)
Platelets: 215 10*3/uL (ref 150–400)
RBC: 4.54 MIL/uL (ref 3.87–5.11)
RDW: 15.4 % (ref 11.5–15.5)

## 2012-02-09 LAB — BASIC METABOLIC PANEL
BUN: 18 mg/dL (ref 6–23)
CO2: 28 mEq/L (ref 19–32)
Chloride: 104 mEq/L (ref 96–112)
Glucose, Bld: 89 mg/dL (ref 70–99)
Potassium: 4.7 mEq/L (ref 3.5–5.1)
Sodium: 140 mEq/L (ref 135–145)

## 2012-02-09 NOTE — Pre-Procedure Instructions (Signed)
21 Melissa [name]  02/09/2012   Your procedure is scheduled on:  02/14/12 (Monday)  Report to [facility] at 08:30 AM.  Call this number if you have problems the morning of surgery: [phone]   Remember:   Do not eat food:After Midnight.  May have clear liquids:until Midnight .  Clear liquids include soda, tea, black coffee, apple or grape juice, broth.  Take these medicines the morning of surgery with A SIP OF WATER: Trimethoprim    Do not wear jewelry, make-up or nail polish.  Do not wear lotions, powders, or perfumes. You may wear deodorant.  Do not shave 48 hours prior to surgery. Men may shave face and neck.  Do not bring valuables to the hospital.  Contacts, dentures or bridgework may not be worn into surgery.  Leave suitcase in the car. After surgery it may be brought to your room.  For patients admitted to the hospital, checkout time is 11:00 AM the day of discharge.   Patients discharged the day of surgery will not be allowed to drive home.  Name and phone number of your driver: Myna Freimark [phone]  Special Instructions: CHG Shower Use Special Wash: 1/2 bottle night before surgery and 1/2 bottle morning of surgery.   Please read over the following fact sheets that you were given: Pain Booklet, Coughing and Deep Breathing, MRSA Information and Surgical Site Infection Prevention

## 2012-02-13 MED ORDER — CHLORHEXIDINE GLUCONATE 4 % EX LIQD: 1.0000 "application " | Freq: Once | CUTANEOUS | Status: DC

## 2012-02-13 MED ORDER — CEFAZOLIN SODIUM-DEXTROSE 2-3 GM-% IV SOLR
2.0000 g | INTRAVENOUS | Status: DC
  Filled 2012-02-13: qty 50

## 2012-02-14 ENCOUNTER — Encounter (HOSPITAL_COMMUNITY): Payer: Self-pay | Admitting: *Deleted

## 2012-02-14 ENCOUNTER — Encounter (HOSPITAL_COMMUNITY): Payer: Self-pay | Admitting: Anesthesiology

## 2012-02-14 ENCOUNTER — Ambulatory Visit (HOSPITAL_COMMUNITY)
Admission: RE | Admit: 2012-02-14 | Discharge: 2012-02-14 | Disposition: A | Discharge status: Home / Self Care | Payer: BC Managed Care – PPO | Source: Ambulatory Visit | Attending: Surgery | Admitting: Surgery

## 2012-02-14 ENCOUNTER — Encounter (HOSPITAL_COMMUNITY)
Admission: RE | Disposition: A | Discharge status: Home / Self Care | Payer: Self-pay | Source: Ambulatory Visit | Attending: Surgery

## 2012-02-14 DIAGNOSIS — Z01812 Encounter for preprocedural laboratory examination: Secondary | ICD-10-CM | POA: Insufficient documentation

## 2012-02-14 DIAGNOSIS — C50419 Malignant neoplasm of upper-outer quadrant of unspecified female breast: Secondary | ICD-10-CM | POA: Insufficient documentation

## 2012-02-14 DIAGNOSIS — D0590 Unspecified type of carcinoma in situ of unspecified breast: Secondary | ICD-10-CM

## 2012-02-14 HISTORY — PX: BREAST LUMPECTOMY: SHX2

## 2012-02-14 SURGERY — BREAST LUMPECTOMY
Anesthesia: General | Site: Breast | Laterality: Left | Wound class: Clean

## 2012-02-14 MED ORDER — LACTATED RINGERS IV SOLN
INTRAVENOUS | Status: DC | PRN
  Administered 2012-02-14: 11:00:00 via INTRAVENOUS

## 2012-02-14 MED ORDER — HYDROMORPHONE HCL PF 1 MG/ML IJ SOLN: 0.2500 mg | INTRAMUSCULAR | Status: DC | PRN

## 2012-02-14 MED ORDER — BUPIVACAINE HCL (PF) 0.25 % IJ SOLN
INTRAMUSCULAR | Status: AC
  Filled 2012-02-14: qty 30

## 2012-02-14 MED ORDER — MIDAZOLAM HCL 2 MG/2ML IJ SOLN: 0.5000 mg | Freq: Once | INTRAMUSCULAR | Status: DC | PRN

## 2012-02-14 MED ORDER — LACTATED RINGERS IV SOLN
INTRAVENOUS | Status: DC
  Administered 2012-02-14: 10:00:00 via INTRAVENOUS

## 2012-02-14 MED ORDER — PROMETHAZINE HCL 25 MG/ML IJ SOLN: 6.2500 mg | INTRAMUSCULAR | Status: DC | PRN

## 2012-02-14 MED ORDER — OXYCODONE-ACETAMINOPHEN 5-325 MG PO TABS: 1.0000 | ORAL_TABLET | ORAL | Status: DC | PRN

## 2012-02-14 MED ORDER — MEPERIDINE HCL 25 MG/ML IJ SOLN: 6.2500 mg | INTRAMUSCULAR | Status: DC | PRN

## 2012-02-14 MED ORDER — BUPIVACAINE HCL (PF) 0.25 % IJ SOLN
INTRAMUSCULAR | Status: DC | PRN
  Administered 2012-02-14: 20 mL

## 2012-02-14 MED ORDER — 0.9 % SODIUM CHLORIDE (POUR BTL) OPTIME
TOPICAL | Status: DC | PRN
  Administered 2012-02-14: 1000 mL

## 2012-02-14 MED ORDER — OXYCODONE-ACETAMINOPHEN 5-325 MG PO TABS: 1.0000 | ORAL_TABLET | ORAL | Status: AC | PRN

## 2012-02-14 SURGICAL SUPPLY — 57 items
APPLIER CLIP 9.375 MED OPEN (MISCELLANEOUS)
APPLIER CLIP 9.375 SM OPEN (CLIP) ×3
BINDER BREAST LRG (GAUZE/BANDAGES/DRESSINGS) IMPLANT
BINDER BREAST XLRG (GAUZE/BANDAGES/DRESSINGS) IMPLANT
CANISTER SUCTION 2500CC (MISCELLANEOUS) ×3 IMPLANT
CHLORAPREP W/TINT 26ML (MISCELLANEOUS) ×3 IMPLANT
CLIP APPLIE 9.375 MED OPEN (MISCELLANEOUS) IMPLANT
CLIP APPLIE 9.375 SM OPEN (CLIP) ×2 IMPLANT
CLOTH BEACON ORANGE TIMEOUT ST (SAFETY) ×3 IMPLANT
CONT SPEC 4OZ CLIKSEAL STRL BL (MISCELLANEOUS) ×6 IMPLANT
COVER SURGICAL LIGHT HANDLE (MISCELLANEOUS) ×3 IMPLANT
DERMABOND ADVANCED (GAUZE/BANDAGES/DRESSINGS) ×1
DERMABOND ADVANCED .7 DNX12 (GAUZE/BANDAGES/DRESSINGS) ×2 IMPLANT
DRAIN CHANNEL 19F RND (DRAIN) IMPLANT
DRAPE LAPAROSCOPIC ABDOMINAL (DRAPES) ×3 IMPLANT
DRAPE PROXIMA HALF (DRAPES) ×3 IMPLANT
DRAPE SURG 17X23 STRL (DRAPES) ×6 IMPLANT
DRAPE UTILITY 15X26 W/TAPE STR (DRAPE) ×6 IMPLANT
DRSG EMULSION OIL 3X3 NADH (GAUZE/BANDAGES/DRESSINGS) ×3 IMPLANT
ELECT BLADE 4.0 EZ CLEAN MEGAD (MISCELLANEOUS)
ELECT CAUTERY BLADE 6.4 (BLADE) ×3 IMPLANT
ELECT REM PT RETURN 9FT ADLT (ELECTROSURGICAL) ×3
ELECTRODE BLDE 4.0 EZ CLN MEGD (MISCELLANEOUS) IMPLANT
ELECTRODE REM PT RTRN 9FT ADLT (ELECTROSURGICAL) ×2 IMPLANT
EVACUATOR SILICONE 100CC (DRAIN) IMPLANT
GAUZE VASELINE 3X9 (GAUZE/BANDAGES/DRESSINGS) IMPLANT
GEL ULTRASOUND 20GR AQUASONIC (MISCELLANEOUS) ×3 IMPLANT
GLOVE BIOGEL PI IND STRL 6.5 (GLOVE) ×4 IMPLANT
GLOVE BIOGEL PI IND STRL 7.0 (GLOVE) ×2 IMPLANT
GLOVE BIOGEL PI IND STRL 7.5 (GLOVE) ×4 IMPLANT
GLOVE BIOGEL PI INDICATOR 6.5 (GLOVE) ×2
GLOVE BIOGEL PI INDICATOR 7.0 (GLOVE) ×1
GLOVE BIOGEL PI INDICATOR 7.5 (GLOVE) ×2
GLOVE ECLIPSE 6.0 STRL STRAW (GLOVE) ×6 IMPLANT
GLOVE EUDERMIC 7 POWDERFREE (GLOVE) ×3 IMPLANT
GLOVE SURG SS PI 7.0 STRL IVOR (GLOVE) ×3 IMPLANT
GOWN PREVENTION PLUS XLARGE (GOWN DISPOSABLE) ×3 IMPLANT
GOWN STRL NON-REIN LRG LVL3 (GOWN DISPOSABLE) ×12 IMPLANT
KIT BASIN OR (CUSTOM PROCEDURE TRAY) ×3 IMPLANT
KIT ROOM TURNOVER OR (KITS) ×3 IMPLANT
NEEDLE HYPO 25GX1X1/2 BEV (NEEDLE) ×3 IMPLANT
NS IRRIG 1000ML POUR BTL (IV SOLUTION) ×3 IMPLANT
PACK GENERAL/GYN (CUSTOM PROCEDURE TRAY) ×3 IMPLANT
PAD ARMBOARD 7.5X6 YLW CONV (MISCELLANEOUS) ×3 IMPLANT
PEN SKIN MARKING BROAD (MISCELLANEOUS) IMPLANT
SPECIMEN JAR LARGE (MISCELLANEOUS) IMPLANT
SPONGE GAUZE 4X4 12PLY (GAUZE/BANDAGES/DRESSINGS) ×3 IMPLANT
SPONGE INTESTINAL PEANUT (DISPOSABLE) IMPLANT
SPONGE LAP 4X18 X RAY DECT (DISPOSABLE) ×3 IMPLANT
STAPLER VISISTAT 35W (STAPLE) IMPLANT
SUT ETHILON 3 0 FSL (SUTURE) IMPLANT
SUT MNCRL AB 4-0 PS2 18 (SUTURE) ×3 IMPLANT
SUT VIC AB 3-0 SH 18 (SUTURE) ×3 IMPLANT
SYR CONTROL 10ML LL (SYRINGE) ×3 IMPLANT
TAPE CLOTH SURG 6X10 WHT LF (GAUZE/BANDAGES/DRESSINGS) ×3 IMPLANT
TOWEL OR 17X24 6PK STRL BLUE (TOWEL DISPOSABLE) ×3 IMPLANT
TOWEL OR 17X26 10 PK STRL BLUE (TOWEL DISPOSABLE) ×3 IMPLANT

## 2012-02-14 NOTE — Interval H&P Note (Signed)
History and Physical Interval Note:  02/14/2012 10:52 AM  Melissa [name]  has presented today for surgery, with the diagnosis of Left breast cancer  The various methods of treatment have been discussed with the patient and family. After consideration of risks, benefits and other options for treatment, the patient has consented to  Procedure(s) (LRB): SIMPLE MASTECTOMY (Left) as a surgical intervention with the possibility that we may only do a partial mastectomy .  The patient's history has been reviewed, patient examined, no change in status, stable for surgery.  I have reviewed the patient's chart and labs.  Questions were answered to the patient's satisfaction.  I spoke with her and her husband last night and told them that I had reviewed her situation with Dr Melissa [name] at [facility]. Her tumor is so high, basically in the axilla, that we may gain little by doing a total mastectomy. My plan had been to excise the old scar to be sure that we had a good superficial margin and then do a standard mastectomy incision. However, it may be that we can get excellent margins just with the axillary incision, and therefore could consider leaving the remainder of the breast. Plan today will be to see if I can get appropriate margin and if so, not do a total mastectomy. She knows we may yet do a mastectomy later and that she can still have new cancer or local recurrence   Melissa [name] J

## 2012-02-14 NOTE — Transfer of Care (Signed)
Immediate Anesthesia Transfer of Care Note  Patient: Melissa [name]  Procedure(s) Performed: Procedure(s) (LRB): LUMPECTOMY (Left)  Patient Location: PACU  Anesthesia Type: General  Level of Consciousness: awake  Airway & Oxygen Therapy: Patient Spontanous Breathing and Patient connected to nasal cannula oxygen  Post-op Assessment: Report given to PACU RN and Post -op Vital signs reviewed and stable  Post vital signs: Reviewed and stable  Complications: No apparent anesthesia complications

## 2012-02-14 NOTE — Anesthesia Postprocedure Evaluation (Signed)
  Anesthesia Post-op Note  Patient: Melissa [name]  Procedure(s) Performed: Procedure(s) (LRB): LUMPECTOMY (Left)  Patient Location: PACU  Anesthesia Type: General  Level of Consciousness: awake, alert  and oriented  Airway and Oxygen Therapy: Patient Spontanous Breathing  Post-op Pain: none  Post-op Assessment: Post-op Vital signs reviewed, Patient's Cardiovascular Status Stable, Respiratory Function Stable, Patent Airway, No signs of Nausea or vomiting and Pain level controlled  Post-op Vital Signs: Reviewed and stable  Complications: No apparent anesthesia complications

## 2012-02-14 NOTE — Anesthesia Preprocedure Evaluation (Signed)
Anesthesia Evaluation  Patient identified by MRN, date of birth, ID band Patient awake    Reviewed: Allergy & Precautions, H&P , NPO status , Patient's Chart, lab work & pertinent test results  History of Anesthesia Complications Negative for: history of anesthetic complications  Airway  TM Distance: >3 FB Neck ROM: Full    Dental No notable dental hx. (+) Teeth Intact and Dental Advisory Given   Pulmonary neg pulmonary ROS,  breath sounds clear to auscultation  Pulmonary exam normal       Cardiovascular negative cardio ROS  Rhythm:Regular Rate:Normal     Neuro/Psych negative neurological ROS     GI/Hepatic negative GI ROS, Neg liver ROS,   Endo/Other  Morbid obesity  Renal/GU negative Renal ROS     Musculoskeletal   Abdominal (+) + obese,   Peds  Hematology negative hematology ROS (+)   Anesthesia Other Findings   Reproductive/Obstetrics                           Anesthesia Physical Anesthesia Plan  ASA: II  Anesthesia Plan: General   Post-op Pain Management:    Induction: Intravenous  Airway Management Planned: LMA  Additional Equipment:   Intra-op Plan:   Post-operative Plan:   Informed Consent: I have reviewed the patients History and Physical, chart, labs and discussed the procedure including the risks, benefits and alternatives for the proposed anesthesia with the patient or authorized representative who has indicated his/her understanding and acceptance.   Dental advisory given  Plan Discussed with: CRNA and Surgeon  Anesthesia Plan Comments: (Plan routine monitors, GA- LMA OK)        Anesthesia Quick Evaluation

## 2012-02-14 NOTE — H&P (View-Only) (Signed)
Chief complaint: Preop discussion prior to breast surgery  History of present illness: This patient presented a few weeks ago with a recurrence of her left breast cancer which is in the upper outer quadrant, basically in the axilla. Core biopsy had shown an invasive ductal carcinoma receptor positive. Metastatic workup has been negative and the breast MRI has shown no other lesions. She has had a plastic surgical consultation for possible reconstruction after mastectomy but has decided against having a reconstruction.  Family history view systems are all noted and not redictated here.  Exam: Gen.: Patient is alert oriented and healthy-appearing Vital signs: Breasts: She has a hard area in the lumpectomy site in the upper outer quadrant axillary area consistent with a recurrent cancer.  Data reviewed: As noted in history of present illness  Impression: Local, left breast cancer, invasive ductal, receptor positive.  Plan: We'll proceed to schedule her for a mastectomy. We did discuss at some length the surgery and the alternative of a possible repeat lumpectomy since this is so high in the axilla. However there is no data to support doing that. I think all questions been answered. We'll proceed to schedule her at her convenience hopefully fairly soon.

## 2012-02-14 NOTE — Op Note (Signed)
Melissa [name] 01/07/1951 [id number] 02/01/2012  Preoperative diagnosis: Local recurrence, left breast cancer, upper-outer quadrant (axillary tail of Spence)  Postoperative diagnosis: Same  Procedure: Left partial mastectomy  Surgeon: Currie Paris, MD, FACS  Assistant: Horton Chin, MS III  Anesthesia: General   Clinical History and Indications: This patient underwent a lumpectomy and axillary dissection about 15 years ago for a left breast cancer which was in the axillary tail of Spence. The incision was basically a transverse axillary incision such as would normally be used for axillary dissection. A local recurrence was recently detected and proven by core biopsy. It was about 1.5 cm. We had a long discussion about alternatives including proceeding with a mastectomy versus a repeat lumpectomy. She would not be normal a candidate for postop radiation since she's had radiation with her prior lumpectomy. However the location of this tumor was so high and almost basically out of the breast tissue we thought that a wide local excision would be an appropriate option with any failure of local control or involved margins to proceed then to a mastectomy. If we thought we were not able to get an adequate margin today we'll proceed directly to mastectomy today.    Description of Procedure: I saw the patient and her husband are preoperative area and we reviewed the plans as noted above. MR of the left breast as the operative side.  The patient was taken to the operating room and after satisfactory general anesthesia was obtained I used an ultrasound to confirm the exact location of the mass. It was at the very anterior or medial end of her transverse incision just below the scar. I decided that we would get the best margin locally with a transverse incision utilizing the old scar but excising it. I therefore made a long elliptical incision about 2 cm wide going well below the palpable area of the  mass. I did a full thickness excision and felt that I was completely around the mass in all directions. I was down to the chest wall and to the fascia from the pectoralis where I was over the very superior to the pectoralis. A specimen mammogram showed the mass with an adjacent clip from her prior lumpectomy adjacent. At this point we had it took extra margin from inferior medial and superior to be certain that I had a large margin around this reexcision.  I then infiltrated the area was 0.25% plain Marcaine to help with postop analgesia. I used small clips to mark the margins of the new partial mastectomy. I then closed in layers with 3-0 Vicryl, 4-0 Monocryl subcuticular, and Dermabond. Sterile dressings were applied.  The patient are the procedure well. There were no complications. All counts were correct.  Currie Paris, MD, FACS 02/14/2012 12:26 PM

## 2012-02-15 ENCOUNTER — Encounter (HOSPITAL_COMMUNITY): Payer: Self-pay | Admitting: Surgery

## 2012-02-17 ENCOUNTER — Telehealth: Payer: Self-pay | Admitting: *Deleted

## 2012-02-17 ENCOUNTER — Telehealth (INDEPENDENT_AMBULATORY_CARE_PROVIDER_SITE_OTHER): Payer: Self-pay | Admitting: Surgery

## 2012-02-17 NOTE — Telephone Encounter (Signed)
Patient left VM asking for follow up appointment with Dr. Truett Perna. Had surgery on 02/14/12.

## 2012-02-17 NOTE — Telephone Encounter (Signed)
Spoke with her today and she is doing well. No pain today. Reviewed path with her. The report reads that the superior margin is close, but there is additional margin taken that is negative, so believe margins are OK. Superior margin is really axilla.

## 2012-02-17 NOTE — Telephone Encounter (Signed)
Left VM that she currently has appointment with Dr. Truett Perna on Monday, August 5th at 3:45. Keep this appointment per Dr. Truett Perna.

## 2012-02-21 ENCOUNTER — Encounter: Payer: Self-pay | Admitting: *Deleted

## 2012-02-21 NOTE — Progress Notes (Signed)
Dawn ordered Oncotype Dx test w/ [facility].  Faxed request to [facility].  Faxed PAC to [employer] - all on 02/17/12.

## 2012-02-28 ENCOUNTER — Ambulatory Visit (HOSPITAL_BASED_OUTPATIENT_CLINIC_OR_DEPARTMENT_OTHER): Payer: PRIVATE HEALTH INSURANCE | Admitting: Oncology

## 2012-02-28 VITALS — BP 119/71 | HR 65 | Temp 98.8°F | Resp 20 | Ht 66.0 in | Wt 212.8 lb

## 2012-02-28 DIAGNOSIS — Z170 Estrogen receptor positive status [ER+]: Secondary | ICD-10-CM

## 2012-02-28 DIAGNOSIS — C50419 Malignant neoplasm of upper-outer quadrant of unspecified female breast: Secondary | ICD-10-CM

## 2012-02-28 DIAGNOSIS — C50912 Malignant neoplasm of unspecified site of left female breast: Secondary | ICD-10-CM

## 2012-02-28 NOTE — Progress Notes (Signed)
   [facility]    OFFICE PROGRESS NOTE   INTERVAL HISTORY:   She returns as scheduled. She underwent left partial mastectomy by Dr. Jamey Ripa on 02/14/2012. A wide excision was performed including additional inferior medial and superior margins.  The pathology confirmed a 1.5 cm, grade 2 invasive ductal carcinoma with associated grade 2 DCIS. The surgical margins were negative.  An Oncotype score was submitted on the 02/14/2012 tissue and returned with a recurrence score of 24. This predicted an average rate of distant recurrence of 15% if treated with 5 years of tamoxifen.  She notes soreness at the left shoulder. No other complaint.  Objective:  Vital signs in last 24 hours:  Blood pressure 119/71, pulse 65, temperature 98.8 F (37.1 C), temperature source Oral, resp. rate 20, height 5\' 6"  (1.676 m), weight 212 lb 12.8 oz (96.525 kg).    HEENT: Neck without mass Lymphatics: 3-4 mm mobile lesion over the left mid clavicle, no cervical, supraclavicular, or axillary nodes Resp: Lungs clear bilaterally Cardio: Regular rate and rhythm GI: No hepatomegaly Vascular: No leg edema Breasts: Status post a partial mastectomy at the upper outer left breast with a healing incision.       Medications: I have reviewed the patient's current medications.  Assessment/Plan: 1.Stage I (T1 N0) left-sided breast cancer diagnosed in January of 1999, ER positive, PR positive, HER-2 negative, status post a left lumpectomy, left axillary lymph node dissection, and left breast radiation. She completed 5 years of adjuvant tamoxifen therapy in January of 2004.   2. Recurrent invasive breast cancer near the left lumpectomy scar-confirmed on a needle core biopsy 01/05/2012, the pathology is consistent with invasive breast cancer, ER positive, PR positive, HER-2 negative.  -breast MRI 01/17/2012 confirmed an isolated mass in the upper outer left breast  -bone scan on 01/19/2012-negative aside  from an area of very subtle uptake in the inferior sternum without a CT correlate  -staging CTs of the chest, abdomen, and pelvis on 01/19/2012-negative for metastatic disease   -Partial mastectomy 02/14/2012 confirmed a 1.5 cm grade 2 invasive carcinoma with associated DCIS and negative surgical margins  -Oncotype recurrence score-24   Disposition:  She underwent wide excision of the local tumor recurrence in the left breast. The surgical margins appear negative.  Her case was presented at the breast tumor conference several weeks ago. An Oncotype was recommended and this returned in the intermediate risk category with a recurrence score of 24.  I discussed the prognosis and "adjuvant "treatment options with Ms. Vanburen today. She has been referred to Dr. Dayton Scrape to consider the indication for additional radiation.  We discussed the potential small absolute benefit associated with systemic chemotherapy in patients with an intermediate Oncotype score. I recommend adjuvant aromatase inhibitor therapy. She understands a significant decrease in the relapse rate can be expected with hormonal therapy. We discussed TC chemotherapy and the potential small absolute benefit associated with the addition of chemotherapy.  Her initial indication is that she does not wish to receive chemotherapy. I plan to discuss the case with my colleagues. She will see Dr. Dayton Scrape and return for additional discussion next week. I offered her a second opinion with the breast oncology service at [facility].   Thornton Papas, MD  02/28/2012  5:45 PM

## 2012-02-29 ENCOUNTER — Encounter: Payer: Self-pay | Admitting: *Deleted

## 2012-02-29 ENCOUNTER — Other Ambulatory Visit: Payer: Self-pay | Admitting: *Deleted

## 2012-02-29 ENCOUNTER — Other Ambulatory Visit: Payer: Self-pay | Admitting: Oncology

## 2012-02-29 ENCOUNTER — Encounter (INDEPENDENT_AMBULATORY_CARE_PROVIDER_SITE_OTHER): Payer: Self-pay | Admitting: Surgery

## 2012-02-29 ENCOUNTER — Ambulatory Visit (INDEPENDENT_AMBULATORY_CARE_PROVIDER_SITE_OTHER): Payer: BC Managed Care – PPO | Admitting: Surgery

## 2012-02-29 VITALS — BP 108/62 | HR 72 | Temp 97.7°F | Resp 16 | Ht 66.0 in | Wt 212.8 lb

## 2012-02-29 DIAGNOSIS — Z09 Encounter for follow-up examination after completed treatment for conditions other than malignant neoplasm: Secondary | ICD-10-CM

## 2012-02-29 NOTE — Patient Instructions (Signed)
See me again in about a month 

## 2012-02-29 NOTE — Progress Notes (Signed)
Received Oncotype Dx results of 24.  Dawn gave Dr. Truett Perna copy of report.  Took copy to Med Rec to scan.

## 2012-02-29 NOTE — Progress Notes (Signed)
Melissa [name]    [id number] 02/29/2012    06-Dec-1950   CC: Post op Reexcision of local recurrence left breast cancer in the axilla  HPI: The patient returns for post op follow-up. She underwent a Reexcision of her breast cancer that was recurrent on 02/14/12. Over all she feels that she is doing well. She already is aware of the path report and saw him colchicine yesterday. They did discuss going ahead with chemotherapy since she has a intermediate risk recurrence score. However currently she is planning otis to do anti-estrogens.  PE: VITAL SIGNS: BP 108/62  Pulse 72  Temp 97.7 F (36.5 C) (Temporal)  Resp 16  Ht 5\' 6"  (1.676 m)  Wt 212 lb 12.8 oz (96.525 kg)  BMI 34.35 kg/m2  The incision is healing nicely and there is no evidence of infection or hematoma.    DATA REVIEWED: Pathology report showed 1.5 cm cancer. On initial excision the superior margin was close but Extra superior margin taken so the margins should be negative.  IMPRESSION: Patient doing well.   PLAN: Her next visit will be in 4 weeks. I reviewed her pathology report, the issue of the close margin and pointed out that it was the superior margin up towards the axilla and at the margin towards her breast was completely negative.Marland Kitchen

## 2012-03-01 ENCOUNTER — Encounter: Payer: Self-pay | Admitting: Radiation Oncology

## 2012-03-02 ENCOUNTER — Encounter: Payer: Self-pay | Admitting: Radiation Oncology

## 2012-03-02 ENCOUNTER — Ambulatory Visit
Admission: RE | Admit: 2012-03-02 | Discharge: 2012-03-02 | Disposition: A | Discharge status: Home / Self Care | Payer: BC Managed Care – PPO | Source: Ambulatory Visit | Attending: Radiation Oncology | Admitting: Radiation Oncology

## 2012-03-02 VITALS — BP 105/70 | HR 65 | Temp 98.4°F | Resp 20 | Ht 66.0 in | Wt 215.7 lb

## 2012-03-02 DIAGNOSIS — Z923 Personal history of irradiation: Secondary | ICD-10-CM | POA: Insufficient documentation

## 2012-03-02 DIAGNOSIS — C50919 Malignant neoplasm of unspecified site of unspecified female breast: Secondary | ICD-10-CM

## 2012-03-02 DIAGNOSIS — M1990 Unspecified osteoarthritis, unspecified site: Secondary | ICD-10-CM | POA: Insufficient documentation

## 2012-03-02 HISTORY — DX: Personal history of irradiation: Z92.3

## 2012-03-02 HISTORY — DX: Other chronic pain: G89.29

## 2012-03-02 MED FILL — Ondansetron HCl Inj 4 MG/2ML (2 MG/ML): INTRAMUSCULAR | Qty: 2 | Status: AC

## 2012-03-02 MED FILL — Propofol IV Emul 10 MG/ML: INTRAVENOUS | Qty: 20 | Status: AC

## 2012-03-02 MED FILL — Propofol IV Emul 10 MG/ML: INTRAVENOUS | Qty: 200 | Status: AC

## 2012-03-02 MED FILL — Ephedrine Sulfate Inj 50 MG/ML: INTRAMUSCULAR | Qty: 1 | Status: AC

## 2012-03-02 MED FILL — Fentanyl Citrate Inj 0.05 MG/ML: INTRAMUSCULAR | Qty: 5 | Status: AC

## 2012-03-02 MED FILL — Midazolam HCl Inj 2 MG/2ML (Base Equivalent): INTRAMUSCULAR | Qty: 2 | Status: AC

## 2012-03-02 NOTE — Progress Notes (Signed)
Followup note:  Diagnosis: Recurrent invasive ductal carcinoma of the left breast  [name] returns today for review and evaluation of her recurrent invasive ductal carcinoma of the left breast. She presented with a 0. 9 cm invasive mammary carcinoma along the tail of the left breast in late 1998 for she underwent excision on 07/23/1997. The nozzle came to within 0.1 cm of the inferior margin, and microscopically was found to represent a moderately well-differentiated ductal carcinoma. She underwent reexcision and axillary dissection on July 29, 1997 and there was no residual carcinoma identified and all 15 lymph nodes were free of metastatic disease. Her primary tumor was ER/PR positive with a low proliferation index. Less than 5% of the tumor represented intraductal carcinoma. She will onto receive radiation therapy to her left breast and left axilla with a curative dose of 5040 cGy followed by a tumor bed boost of 6 or centigray for a cumulative dose of 5640 cGy and 31 sessions. She completed her radiation therapy on 10/03/1997. She completed 5 years of adjuvant tamoxifen by January 2004. [facility]2011 showed a possible mass within the tail of the left breast. Additional views on 01/04/2012 along with ultrasound showed a suspicious 1 cm mass at 1:00, nonseminomatous from the left nipple with biopsy diagnostic for invasive ductal carcinoma. This was in the same vicinity as her initial primary tumor. Breast MR showed a solitary 1.5 cm mass for posteriorly in the upper-outer quadrant of the left breast. She was presented at the morning breast conference and Dr. Jamey Ripa suggests that she had a wide excision rather than have a completion mastectomy because of the location of her recurrence. She underwent a partial mastectomy/wide excision on 02/14/2012. Her recurrence measured 1.5 cm, and while the initial superior margin was 0.1 mm additional tissue taken superiorly and medially  widely cleared the margins with no evidence for residual carcinoma. Dr. Jamey Ripa went down to the chest wall into the fascia from the pectoralis. The specimen mammogram showed the mass with adjacent clip present from her prior lumpectomy. Her recurrence again represented invasive ductal carcinoma, moderately differentiated, ER positive at 100% and PR positive at 67%. Initial Ki-67 was 35%. The patient was seen by Dr. Truett Perna who performed Oncotype DX testing in her score was 24 indicating an average rate of distant recurrence of 15% placing her in the intermediate risk group. There is not felt to be a substantial benefit of chemotherapy compared to adjuvant hormone therapy and thus she is being started on adjuvant hormone therapy with an aromatase inhibitor. She saw Dr. Jamey Ripa 2 days ago and is doing well postoperatively.  Physical examination: Alert and oriented 61 year old after American female appearing younger than her stated age. Wt Readings from Last 3 Encounters:  03/02/12 215 lb 11.2 oz (97.841 kg)  02/29/12 212 lb 12.8 oz (96.525 kg)  02/28/12 212 lb 12.8 oz (96.525 kg)   Temp Readings from Last 3 Encounters:  03/02/12 98.4 F (36.9 C) Oral  02/29/12 97.7 F (36.5 C) Temporal  02/28/12 98.8 F (37.1 C) Oral   BP Readings from Last 3 Encounters:  03/02/12 105/70  02/29/12 108/62  02/28/12 119/71   Pulse Readings from Last 3 Encounters:  03/02/12 65  02/29/12 72  02/28/12 65    Head and neck examination: Grossly unremarkable. Nodes: Without palpable cervical, supraclavicular, or axillary lymphadenopathy. Her left breast tail/axillary wound is healing well. No seroma or hematoma. Chest: Lungs clear. Breasts: Minimal thickening of left breast, no  masses are appreciated. Right breast without masses or lesions. Extremities without edema. Neurologic examination: Upper extremities neurologically intact.  Impression: Breast recurrences beyond 10 years typically represent a new primary.  However, the location and histologic appearance certainly suggest recurrent disease of her initial breast primary. Conceivably, she could have developed recurrent disease from uncontrolled residual DCIS. The point, however, is academic. I'm in total agreement with Dr. Jamey Ripa surgical approach recognizing that a completion mastectomy would not significantly reduce her risk for a local recurrence. Her dosimetry and treatment fields were reviewed in great detail, and there is no possibility of giving further radiation therapy by either external beam radiation therapy or brachytherapy without placing her at significant risk for soft tissue or axillary nerve damage. I'm in complete agreement with adjuvant hormone therapy which will not only decrease her risk for a local regional failure but also reduce the risk for development of a potential new cancer in either breast by at least 40-50%.  Plan: She'll follow through with adjuvant hormone therapy through Dr. Truett Perna, and also maintain followup with Dr. Jamey Ripa.

## 2012-03-02 NOTE — Progress Notes (Signed)
Pt denies pain, fatigue, loss of appetite, states her left axilla has healed well.

## 2012-03-02 NOTE — Progress Notes (Signed)
Please see the Nurse Progress Note in the MD Initial Consult Encounter for this patient. 

## 2012-03-06 ENCOUNTER — Ambulatory Visit: Payer: PRIVATE HEALTH INSURANCE | Admitting: Genetic Counselor

## 2012-03-06 ENCOUNTER — Other Ambulatory Visit: Payer: PRIVATE HEALTH INSURANCE | Admitting: Lab

## 2012-03-06 ENCOUNTER — Encounter: Payer: Self-pay | Admitting: Genetic Counselor

## 2012-03-06 DIAGNOSIS — C50919 Malignant neoplasm of unspecified site of unspecified female breast: Secondary | ICD-10-CM

## 2012-03-06 NOTE — Progress Notes (Signed)
Dr. Truett Perna requested a consultation for genetic counseling and risk assessment for Melissa [name], a 61 y.o. female, for discussion of her breast cancer. She presents to clinic today to discuss the possibility of a genetic predisposition to cancer, and to further clarify her risks, as well as her family members' risks for cancer.   HISTORY OF PRESENT ILLNESS: In 1999, at the age of 5, Melissa [name] was diagnosed with invasive ductal carcinoma of the brest. In 2013, at the age of 26, Alin was diagnosed with IDC.  It is unknown if this is a recurrence or a second primary.    Past Medical History  Diagnosis Date  . Breast cancer 07/23/97    left, tx w/xrt, Tamoxifen x 5 yrs  . Anemia   . Arthritis     Bilateral Knees  . History of bladder infections   . Recurrent breast cancer 01/04/12    biopsy, ER/PR+, Her 2 -  . Hx of radiation therapy 08/22/97 - 10/03/97    left breast  . Chronic pain     left knee    Past Surgical History  Procedure Date  . Cesarean section     x 3  . Hernia repair 1977    umbilical   . Cholecystectomy 1993  . Ovary removed 1987  . Breast surgery 1999    lumpectomy-left  . Abdominal hysterectomy 1987    endometriosos  . Breast lumpectomy 02/14/2012    LUMPECTOMY;  Surgeon: Currie Paris, MD;  Location: [facility] OR;  Service: General;  Laterality: Left;    History  Substance Use Topics  . Smoking status: Never Smoker   . Smokeless tobacco: Never Used  . Alcohol Use: No    REPRODUCTIVE HISTORY AND PERSONAL RISK ASSESSMENT FACTORS: Menarche was at age 6-13.   Menopause at 46-47 Uterus Intact: No Ovaries Intact: One ovary intact G3P3A0 , first live birth at age 58  She has not previously undergone treatment for infertility.   Never used OCPs   She has not used HRT in the past.    FAMILY HISTORY:  We obtained a detailed, 4-generation family history.  Significant diagnoses are listed below: Family History  Problem Relation Age of Onset  .  Cancer Mother 62    colon  . Cancer Maternal Aunt     colon  . Cancer Paternal Aunt     breast  . Cancer Maternal Grandfather     prostate  . Cancer Maternal Aunt     colon  . Prostate cancer Maternal Uncle   The patient was diagnosed at ages 16 and 26 with breast cancer.  Her mother was diagnosed with colon cancer at age 20.  The patient has five maternal aunts and three maternal uncles.  Two aunts had colon cancer in their 83s and an uncle had prostate cancer in his 6s.  Her maternal grandfather had prostate cancer in his 52s.  The patient had five paternal uncles and two paternal aunts.  One aunt had breast cancer under the age of 52s.  There is no other reported cancer history.  Patient's maternal ancestors are of [place] descent, and paternal ancestors are of Caucasian and African American descent. There is no reported Ashkenazi Jewish ancestry. There is no  known consanguinity.  GENETIC COUNSELING RISK ASSESSMENT, DISCUSSION, AND SUGGESTED FOLLOW UP: We reviewed the natural history and genetic etiology of sporadic, familial and hereditary cancer syndromes.  About 5-10% of breast cancer is hereditary.  Of this, about 85% is the result of a BRCA1 or BRCA2 mutation.  We reviewed the red flags of hereditary cancer syndromes and the dominant inheritance patterns.  If the BRCA testing is negative, we discussed that we could be testing for the wrong gene.  We discussed gene panels, and that several cancer genes that are associated with different cancers can be tested at the same time.  Because of the different types of cancer that are in the patient's family, we will consider one of the panel tests if she is negative for BRCA mutations.  The patient's personal and family history is suggestive of the following possible diagnosis: hereditary cancer syndrome.  We discussed that identification of a hereditary cancer syndrome may help her care providers tailor the patients  medical management. If a mutation indicating a hereditary cancer syndrome is detected in this case, the [employer] recommendations would include increased cancer surveillance and possible prophylactic surgery. If a mutation is detected, the patient will be referred back to the referring provider and to any additional appropriate care providers to discuss the relevant options.   If a mutation is not found in the patient, this will decrease the likelihood of a hereditary cancer syndrome as the explanation for her breast cancer. Cancer surveillance options would be discussed for the patient according to the appropriate standard National Comprehensive Cancer Network and American Cancer Society guidelines, with consideration of their personal and family history risk factors. In this case, the patient will be referred back to their care providers for discussions of management.   In order to estimate her chance of having a BRCA1 or BRCA2 mutation, we used statistical models (Penn II) and laboratory data that take into account her personal medical history, family history and ancestry.  Because each model is different, there can be a lot of variability in the risks they give.  Therefore, these numbers must be considered a rough range and not a precise risk of having a BRCA1 or BRCA2 mutation.  These models estimate that she has approximately a 14% chance of having a mutation. Based on this assessment of her family and personal history, genetic testing is recommended.  After considering the risks, benefits, and limitations, the patient decided to think about testing.  She will call if she is interested.  The patient was seen for a total of 60 minutes, greater than 50% of which was spent face-to-face counseling.  This plan is being carried out per Dr. Kalman Drape recommendations.  This note will also be sent to the referring provider via the electronic medical record. The patient will be  supplied with a summary of this genetic counseling discussion as well as educational information on the discussed hereditary cancer syndromes following the conclusion of their visit.   Patient was discussed with Dr. Drue Second.    _______________________________________________________________________ For Office Staff:  Number of people involved in session: 2 Was an Intern/ student involved with case: not applicable

## 2012-03-08 ENCOUNTER — Ambulatory Visit: Payer: BC Managed Care – PPO | Admitting: Oncology

## 2012-03-09 ENCOUNTER — Ambulatory Visit (HOSPITAL_BASED_OUTPATIENT_CLINIC_OR_DEPARTMENT_OTHER): Payer: BC Managed Care – PPO | Admitting: Oncology

## 2012-03-09 VITALS — BP 119/72 | HR 75 | Temp 97.1°F | Resp 18 | Ht 66.0 in | Wt 215.4 lb

## 2012-03-09 DIAGNOSIS — C50919 Malignant neoplasm of unspecified site of unspecified female breast: Secondary | ICD-10-CM

## 2012-03-09 DIAGNOSIS — Z853 Personal history of malignant neoplasm of breast: Secondary | ICD-10-CM

## 2012-03-09 DIAGNOSIS — Z170 Estrogen receptor positive status [ER+]: Secondary | ICD-10-CM

## 2012-03-09 DIAGNOSIS — C801 Malignant (primary) neoplasm, unspecified: Secondary | ICD-10-CM

## 2012-03-09 DIAGNOSIS — C7981 Secondary malignant neoplasm of breast: Secondary | ICD-10-CM

## 2012-03-09 NOTE — Progress Notes (Signed)
   [facility]    OFFICE PROGRESS NOTE   INTERVAL HISTORY:   She returns as scheduled. She saw Dr. Dayton Scrape and no further radiation was recommended.  The left breast incision has healed. She reports mild numbness at the left upper arm. There is a small fluid collection at the left axilla.  Ms. Duhon has considered the chemotherapy option and has decided against chemotherapy.  Objective:  Vital signs in last 24 hours:  Blood pressure 119/72, pulse 75, temperature 97.1 F (36.2 C), temperature source Oral, resp. rate 18, height 5\' 6"  (1.676 m), weight 215 lb 6.4 oz (97.705 kg).    HEENT: Pea-sized cutaneous nodular structure overlying the left clavicle Breast: The left breast incision has healed. There is a small fluid collection underlying the lateral aspect of the surgical incision near the axillary line. Lymph nodes: No cervical, supraclavicular, or left axillary nodes     Medications: I have reviewed the patient's current medications.  Assessment/Plan: 1.Stage I (T1 N0) left-sided breast cancer diagnosed in January of 1999, ER positive, PR positive, HER-2 negative, status post a left lumpectomy, left axillary lymph node dissection, and left breast radiation. She completed 5 years of adjuvant tamoxifen therapy in January of 2004.  2. Recurrent invasive breast cancer near the left lumpectomy scar-confirmed on a needle core biopsy 01/05/2012, the pathology is consistent with invasive breast cancer, ER positive, PR positive, HER-2 negative. ? Local recurrence versus a new breast primary -breast MRI 01/17/2012 confirmed an isolated mass in the upper outer left breast  -bone scan on 01/19/2012-negative aside from an area of very subtle uptake in the inferior sternum without a CT correlate  -staging CTs of the chest, abdomen, and pelvis on 01/19/2012-negative for metastatic disease  -Partial mastectomy 02/14/2012 confirmed a 1.5 cm grade 2 invasive carcinoma with  associated DCIS and negative surgical margins  -Oncotype recurrence score-24  3. Tiny cutaneous nodular lesion overlying the left clavicle-likely a benign finding   Disposition:  I discussed treatment options with Ms. Waltrip again today. She has reviewed the Oncotype result and discussed options with her husband. She understands the benefits associated with hormonal therapy and chemotherapy. She has decided against chemotherapy.  The plan is to begin Femara. We reviewed the potential toxicities associated with Femara including the chance for hot flashes, arthralgias, decreased bone density, and elevation of the cholesterol. She reports taking a calcium supplement.  She will return for an office visit in 3 months. Ms. Warnick will contact us in the interim for new symptoms.  She will followup with Dr. Jamey Ripa for management of the left chest wall fluid collection.   Thornton Papas, MD  03/09/2012  5:01 PM

## 2012-03-10 ENCOUNTER — Other Ambulatory Visit: Payer: Self-pay | Admitting: *Deleted

## 2012-03-10 DIAGNOSIS — C50919 Malignant neoplasm of unspecified site of unspecified female breast: Secondary | ICD-10-CM

## 2012-03-10 MED ORDER — LETROZOLE 2.5 MG PO TABS: 2.5000 mg | ORAL_TABLET | Freq: Every day | ORAL | Status: DC

## 2012-03-23 ENCOUNTER — Ambulatory Visit: Payer: BC Managed Care – PPO

## 2012-03-23 ENCOUNTER — Ambulatory Visit: Payer: BC Managed Care – PPO | Admitting: Radiation Oncology

## 2012-03-29 ENCOUNTER — Ambulatory Visit (INDEPENDENT_AMBULATORY_CARE_PROVIDER_SITE_OTHER): Payer: BC Managed Care – PPO | Admitting: Surgery

## 2012-03-29 ENCOUNTER — Encounter (INDEPENDENT_AMBULATORY_CARE_PROVIDER_SITE_OTHER): Payer: Self-pay | Admitting: Surgery

## 2012-03-29 VITALS — BP 116/66 | HR 68 | Temp 97.8°F | Resp 16 | Ht 66.5 in | Wt 218.0 lb

## 2012-03-29 DIAGNOSIS — Z09 Encounter for follow-up examination after completed treatment for conditions other than malignant neoplasm: Secondary | ICD-10-CM

## 2012-03-29 NOTE — Progress Notes (Signed)
Melissa [name]    [id number] 03/29/2012    1950-10-19   CC: Post op Reexcision of local recurrence left breast cancer in the axilla  HPI: The patient returns for post op follow-up. She underwent a Reexcision of her breast cancer that was recurrent on 02/14/12. Over all she feels that she is doing well. She has seen medical and radiatioin oncology. NO more radiation is available. She has declined chemo and will do only anti-estrogen.  PE: VITAL SIGNS: BP 116/66  Pulse 68  Temp 97.8 F (36.6 C) (Temporal)  Resp 16  Ht 5' 6.5" (1.689 m)  Wt 218 lb (98.884 kg)  BMI 34.66 kg/m2  The incision is healing nicely and there is no evidence of infection or hematoma.    DATA REVIEWED: Reviewed the notes from rad oncology  IMPRESSION: Patient doing well.   PLAN: RTC 3 months

## 2012-03-29 NOTE — Patient Instructions (Signed)
See me again in three months 

## 2012-05-01 ENCOUNTER — Telehealth: Payer: Self-pay | Admitting: *Deleted

## 2012-05-01 NOTE — Telephone Encounter (Signed)
Notified patient that MD doubts rash is related to Femara. She needs to see her PCP.

## 2012-05-01 NOTE — Telephone Encounter (Signed)
Asking if Femara can cause rash/hives ? Has been on medication for about 40 days. Over past week has developed a rash from her neck down on to trunk that is raised and itches. Topical hydrocortisone has not helped. OTC po Benadryl has helped the itching some, but not the rash. Denies any know allergens or new meds or detergents.  Told her it is doubtful it is from the Femara, but will check with MD.

## 2012-06-05 ENCOUNTER — Ambulatory Visit (HOSPITAL_BASED_OUTPATIENT_CLINIC_OR_DEPARTMENT_OTHER): Payer: BC Managed Care – PPO | Admitting: Oncology

## 2012-06-05 ENCOUNTER — Telehealth: Payer: Self-pay | Admitting: Oncology

## 2012-06-05 VITALS — BP 123/67 | HR 69 | Temp 97.2°F | Resp 20 | Ht 66.5 in | Wt 221.5 lb

## 2012-06-05 DIAGNOSIS — C50419 Malignant neoplasm of upper-outer quadrant of unspecified female breast: Secondary | ICD-10-CM

## 2012-06-05 DIAGNOSIS — Z170 Estrogen receptor positive status [ER+]: Secondary | ICD-10-CM

## 2012-06-05 DIAGNOSIS — R21 Rash and other nonspecific skin eruption: Secondary | ICD-10-CM

## 2012-06-05 DIAGNOSIS — C50912 Malignant neoplasm of unspecified site of left female breast: Secondary | ICD-10-CM

## 2012-06-05 NOTE — Progress Notes (Signed)
   [facility]    OFFICE PROGRESS NOTE   INTERVAL HISTORY:   She returns as scheduled. She is taking Femara. She developed a diffuse "eczema "rash over the trunk and breast after beginning Femara. She saw a dermatologist. The rash has improved with hydrocortisone cream and vitamin E cream. There are remaining mild eczema changes at the left breast. She is having 5-6 hot flashes per day. Mild arthralgias since beginning Femara. It is not clear whether the arthralgias are related to Femara or her exercise program. She takes Aleve for relief of the arthralgias. No change at the left breast surgical site. No left arm edema  Objective:  Vital signs in last 24 hours:  Blood pressure 123/67, pulse 69, temperature 97.2 F (36.2 C), temperature source Oral, resp. rate 20, height 5' 6.5" (1.689 m), weight 221 lb 8 oz (100.472 kg).    HEENT: Neck without mass Lymphatics: No cervical, supraclavicular, or axillary nodes Resp: Lungs clear bilateral Cardio: Regular rate and rhythm GI: No hepatomegaly Vascular: No leg edema , no left arm edema Skin: Mild dry flaking hyperpigmentation at the left greater than right lower breast and over the right low lateral abdominal wall.    Medications: I have reviewed the patient's current medications.  Assessment/Plan: 1.Stage I (T1 N0) left-sided breast cancer diagnosed in January of 1999, ER positive, PR positive, HER-2 negative, status post a left lumpectomy, left axillary lymph node dissection, and left breast radiation. She completed 5 years of adjuvant tamoxifen therapy in January of 2004.  2. Recurrent invasive breast cancer near the left lumpectomy scar-confirmed on a needle core biopsy 01/05/2012, the pathology is consistent with invasive breast cancer, ER positive, PR positive, HER-2 negative. ? Local recurrence versus a new breast primary  -breast MRI 01/17/2012 confirmed an isolated mass in the upper outer left breast  -bone scan on  01/19/2012-negative aside from an area of very subtle uptake in the inferior sternum without a CT correlate  -staging CTs of the chest, abdomen, and pelvis on 01/19/2012-negative for metastatic disease  -Partial mastectomy 02/14/2012 confirmed a 1.5 cm grade 2 invasive carcinoma with associated DCIS and negative surgical margins  -Oncotype recurrence score-24  -Initiation of Femara after an office visit on 03/09/2012 3. Tiny cutaneous nodular lesion overlying the left clavicle when she was here on 03/09/2012-not noted today 4. "Eczema "-I am not aware of an Association with Femara, she plans to continue dermatology followup. We can switch to a different aromatase inhibitor if the "eczema "persists.  Disposition:  She remains in clinical remission from breast cancer. She will continue Femara. Ms. Guest will contact us if the arthralgias worsen or the eczema-type rash persists. She will return for an office visit in 4 months.   Thornton Papas, MD  06/05/2012  10:15 AM

## 2012-06-05 NOTE — Telephone Encounter (Signed)
gv and printed appt schedule for pt for March 2014 ° °

## 2012-06-19 ENCOUNTER — Telehealth: Payer: Self-pay | Admitting: *Deleted

## 2012-06-19 NOTE — Telephone Encounter (Signed)
PT. IS TAKING FEMARA FOR HER BREAST CANCER. NOTIFIED SANDY THAT PT. MAY HAVE HER TEETH CLEAN. SHE VOICES UNDERSTANDING.

## 2012-10-03 ENCOUNTER — Ambulatory Visit: Payer: BC Managed Care – PPO | Admitting: Oncology

## 2012-11-20 ENCOUNTER — Other Ambulatory Visit: Payer: Self-pay | Admitting: Family Medicine

## 2012-11-20 ENCOUNTER — Other Ambulatory Visit: Payer: Self-pay | Admitting: Internal Medicine

## 2012-11-20 DIAGNOSIS — Z853 Personal history of malignant neoplasm of breast: Secondary | ICD-10-CM

## 2012-12-28 ENCOUNTER — Ambulatory Visit
Admission: RE | Admit: 2012-12-28 | Discharge: 2012-12-28 | Disposition: A | Discharge status: Home / Self Care | Payer: BC Managed Care – PPO | Source: Ambulatory Visit | Attending: Internal Medicine | Admitting: Internal Medicine

## 2012-12-28 DIAGNOSIS — Z853 Personal history of malignant neoplasm of breast: Secondary | ICD-10-CM

## 2013-02-27 ENCOUNTER — Telehealth: Payer: Self-pay | Admitting: *Deleted

## 2013-02-27 NOTE — Telephone Encounter (Signed)
Call from pt reporting side effects from Femara. Reports severe hot flashes, arthritic pain, joint swelling and brittle hair. Requesting appointment to discuss side effects. Reviewed with MD, work-in appt given for 8/8 at 12PM. Pt agrees to appt.

## 2013-03-01 ENCOUNTER — Telehealth: Payer: Self-pay | Admitting: *Deleted

## 2013-03-01 NOTE — Telephone Encounter (Signed)
Per Kenney Houseman the pt is aware of her appt d/t for 03/02/13@ 12noon...td

## 2013-03-02 ENCOUNTER — Ambulatory Visit (HOSPITAL_BASED_OUTPATIENT_CLINIC_OR_DEPARTMENT_OTHER): Payer: BC Managed Care – PPO | Admitting: Oncology

## 2013-03-02 ENCOUNTER — Telehealth: Payer: Self-pay | Admitting: Oncology

## 2013-03-02 VITALS — BP 115/75 | HR 65 | Temp 98.7°F | Resp 18 | Ht 66.0 in | Wt 224.9 lb

## 2013-03-02 DIAGNOSIS — C50919 Malignant neoplasm of unspecified site of unspecified female breast: Secondary | ICD-10-CM

## 2013-03-02 DIAGNOSIS — C50912 Malignant neoplasm of unspecified site of left female breast: Secondary | ICD-10-CM

## 2013-03-02 NOTE — Progress Notes (Signed)
   [facility]    OFFICE PROGRESS NOTE   INTERVAL HISTORY:   She has been maintained on Femara since August of 2013. She complains of pain in the knees, ankles, and low back. She associates the pain with taking Femara. The pain is partially relieved with Tylenol. She also reports stiffness. No joint swelling or erythema. Good appetite. No change over either breast. Hot flashes have increased since starting Femara.  A bilateral mammogram on 12/28/2012 revealed postlumpectomy changes of the upper outer left breast.  Objective:  Vital signs in last 24 hours:  Blood pressure 115/75, pulse 65, temperature 98.7 F (37.1 C), temperature source Oral, resp. rate 18, height 5\' 6"  (1.676 m), weight 224 lb 14.4 oz (102.014 kg).    HEENT: Neck without mass Lymphatics: No cervical, supra-clavicular, or axillary nodes Resp: Lungs clear bilaterally Cardio: Regular rate and rhythm GI: No hepatomegaly Vascular: No leg edema Breasts: Status post left lumpectomy. Firm nodular tissue inferior to the upper outer left breast scar without a discrete mass. No mass in either breast.  Muscloskeletal: Joints without erythema or edema     Medications: I have reviewed the patient's current medications.  Assessment/Plan: 1.Stage I (T1 N0) left-sided breast cancer diagnosed in January of 1999, ER positive, PR positive, HER-2 negative, status post a left lumpectomy, left axillary lymph node dissection, and left breast radiation. She completed 5 years of adjuvant tamoxifen therapy in January of 2004.  2. Recurrent invasive breast cancer near the left lumpectomy scar-confirmed on a needle core biopsy 01/05/2012, the pathology is consistent with invasive breast cancer, ER positive, PR positive, HER-2 negative. ? Local recurrence versus a new breast primary  -breast MRI 01/17/2012 confirmed an isolated mass in the upper outer left breast  -bone scan on 01/19/2012-negative aside from an area of very  subtle uptake in the inferior sternum without a CT correlate  -staging CTs of the chest, abdomen, and pelvis on 01/19/2012-negative for metastatic disease  -Partial mastectomy 02/14/2012 confirmed a 1.5 cm grade 2 invasive carcinoma with associated DCIS and negative surgical margins  -Oncotype recurrence score-24  -Initiation of Femara after an office visit on 03/09/2012  3. Tiny cutaneous nodular lesion overlying the left clavicle when she was here on 03/09/2012-not noted today  4. "Eczema "-I am not aware of an Association with Femara, she plans to continue dermatology followup. We can switch to a different aromatase inhibitor if the "eczema "persists.  5. Arthralgias-potentially related to Femara  Disposition:  She has been maintained on adjuvant Femara since August of 2013. She presents today with a complaint of severe arthralgias and "stiffness ". The arthralgias are relieved with Tylenol. I have a low clinical suspicion for metastatic breast cancer. I discussed treatment options with Ms. Conkle. We decided to discontinue the Femara for the next 7-10 days. She will contact us with a report on her symptoms 03/19/2013. If the arthralgias appear to be secondary to Femara she will resume Femara and used alternating Tylenol/naproxen for pain. If this is not helpful we will switch to a different aromatase inhibitor or change to tamoxifen.  She will be scheduled for an office visit in 3 months.   Thornton Papas, MD  03/02/2013  3:22 PM

## 2013-03-02 NOTE — Telephone Encounter (Signed)
, °

## 2013-03-13 ENCOUNTER — Telehealth: Payer: Self-pay | Admitting: *Deleted

## 2013-03-13 NOTE — Telephone Encounter (Signed)
Message from pt reporting she resumed her Femara on 8/18 after holding it for 10 days. Stated it "did some good" but no dramatic changes. Stated she will take Aleve as needed for joint pain. Will make MD aware.

## 2013-04-10 ENCOUNTER — Other Ambulatory Visit: Payer: Self-pay | Admitting: *Deleted

## 2013-04-10 DIAGNOSIS — C50919 Malignant neoplasm of unspecified site of unspecified female breast: Secondary | ICD-10-CM

## 2013-04-10 MED ORDER — LETROZOLE 2.5 MG PO TABS: 2.5000 mg | ORAL_TABLET | Freq: Every day | ORAL | Status: DC

## 2013-06-04 ENCOUNTER — Ambulatory Visit (HOSPITAL_BASED_OUTPATIENT_CLINIC_OR_DEPARTMENT_OTHER): Payer: BC Managed Care – PPO | Admitting: Oncology

## 2013-06-04 ENCOUNTER — Encounter (INDEPENDENT_AMBULATORY_CARE_PROVIDER_SITE_OTHER): Payer: Self-pay

## 2013-06-04 VITALS — BP 121/73 | HR 67 | Temp 98.0°F | Resp 18 | Ht 66.0 in | Wt 228.7 lb

## 2013-06-04 DIAGNOSIS — L259 Unspecified contact dermatitis, unspecified cause: Secondary | ICD-10-CM

## 2013-06-04 DIAGNOSIS — C50419 Malignant neoplasm of upper-outer quadrant of unspecified female breast: Secondary | ICD-10-CM

## 2013-06-04 DIAGNOSIS — C50912 Malignant neoplasm of unspecified site of left female breast: Secondary | ICD-10-CM

## 2013-06-04 DIAGNOSIS — M2550 Pain in unspecified joint: Secondary | ICD-10-CM

## 2013-06-04 DIAGNOSIS — M899 Disorder of bone, unspecified: Secondary | ICD-10-CM

## 2013-06-04 DIAGNOSIS — Z170 Estrogen receptor positive status [ER+]: Secondary | ICD-10-CM

## 2013-06-04 DIAGNOSIS — L989 Disorder of the skin and subcutaneous tissue, unspecified: Secondary | ICD-10-CM

## 2013-06-04 NOTE — Progress Notes (Signed)
   [facility]    OFFICE PROGRESS NOTE   INTERVAL HISTORY:   She returns as scheduled. She is taking Femara. She continues to have arthralgias, but these are not severe. She takes meloxicam and naproxen as needed. The left knee has been painful and swollen. She reports receiving a steroid injection by orthopedics.  No change at either breast. No other complaint. Mild hot flashes.  Objective:  Vital signs in last 24 hours:  Blood pressure 121/73, pulse 67, temperature 98 F (36.7 C), temperature source Oral, resp. rate 18, height 5\' 6"  (1.676 m), weight 228 lb 11.2 oz (103.738 kg), SpO2 99.00%.    HEENT: Neck without mass Lymphatics: No cervical, supraclavicular, or right axillary nodes. Medial and superior to the left axillary scar there is a 2-3 mm mobile cutaneous lesion-? Lymph node. Resp: Lungs clear bilaterally Cardio: Regular rate and rhythm GI: No hepatomegaly Vascular: No leg edema Breast: Status post left lumpectomy. No evidence for local tumor recurrence. No mass in either breast the     Medications: I have reviewed the patient's current medications.  Assessment/Plan: 1.Stage I (T1 N0) left-sided breast cancer diagnosed in January of 1999, ER positive, PR positive, HER-2 negative, status post a left lumpectomy, left axillary lymph node dissection, and left breast radiation. She completed 5 years of adjuvant tamoxifen therapy in January of 2004.  2. Recurrent invasive breast cancer near the left lumpectomy scar-confirmed on a needle core biopsy 01/05/2012, the pathology is consistent with invasive breast cancer, ER positive, PR positive, HER-2 negative. ? Local recurrence versus a new breast primary  -breast MRI 01/17/2012 confirmed an isolated mass in the upper outer left breast  -bone scan on 01/19/2012-negative aside from an area of very subtle uptake in the inferior sternum without a CT correlate  -staging CTs of the chest, abdomen, and pelvis on  01/19/2012-negative for metastatic disease  -Partial mastectomy 02/14/2012 confirmed a 1.5 cm grade 2 invasive carcinoma with associated DCIS and negative surgical margins  -Oncotype recurrence score-24  -Initiation of Femara after an office visit on 03/09/2012  3. Tiny cutaneous nodular lesion overlying the left clavicle when she was here on 03/09/2012-not noted today  4. "Eczema "-I am not aware of an Association with Femara, she plans to continue dermatology followup. We can switch to a different aromatase inhibitor if the "eczema "persists.  5. Arthralgias-potentially related to Femara, tolerable at present 6. Pea-sized cutaneous nodular lesion near the left axillary scar-likely a benign finding.  Disposition:  She remains in clinical remission from breast cancer. She will continue Femara. She will return for an office visit in 3 months. She will continue followup with orthopedics for the left knee arthritis. I recommended she begin a calcium/vitamin D supplement. She reports that she is scheduled for a bone density scan in the near future.   Thornton Papas, MD  06/04/2013  11:41 AM

## 2013-06-06 ENCOUNTER — Telehealth: Payer: Self-pay | Admitting: Oncology

## 2013-06-06 NOTE — Telephone Encounter (Signed)
s.w. pt and advised on Feb 2015 appt...pt ok and aware

## 2013-08-20 ENCOUNTER — Other Ambulatory Visit: Payer: Self-pay | Admitting: *Deleted

## 2013-08-20 MED ORDER — LETROZOLE 2.5 MG PO TABS: 2.5000 mg | ORAL_TABLET | Freq: Every day | ORAL | Status: DC

## 2013-08-30 ENCOUNTER — Ambulatory Visit (HOSPITAL_BASED_OUTPATIENT_CLINIC_OR_DEPARTMENT_OTHER): Payer: 59 | Admitting: Oncology

## 2013-08-30 VITALS — BP 123/80 | HR 64 | Temp 98.2°F | Resp 18 | Ht 66.0 in | Wt 221.3 lb

## 2013-08-30 DIAGNOSIS — M2550 Pain in unspecified joint: Secondary | ICD-10-CM

## 2013-08-30 DIAGNOSIS — Z170 Estrogen receptor positive status [ER+]: Secondary | ICD-10-CM

## 2013-08-30 DIAGNOSIS — C50919 Malignant neoplasm of unspecified site of unspecified female breast: Secondary | ICD-10-CM

## 2013-08-30 DIAGNOSIS — R229 Localized swelling, mass and lump, unspecified: Secondary | ICD-10-CM

## 2013-08-30 DIAGNOSIS — C50912 Malignant neoplasm of unspecified site of left female breast: Secondary | ICD-10-CM

## 2013-08-30 NOTE — Progress Notes (Signed)
   [facility]    OFFICE PROGRESS NOTE   INTERVAL HISTORY:   Melissa [name] returns for scheduled followup of breast cancer. She continues Femara. She has arthralgias, chiefly in the knees. She takes Aleve as needed. No palpable change over the breasts. She is taking calcium/vitamin D and had a recent bone density scan at [facility].  Objective:  Vital signs in last 24 hours:  Blood pressure 123/80, pulse 64, temperature 98.2 F (36.8 C), temperature source Oral, resp. rate 18, height $RemoveBe'5\' 6"'WDdbWRVAz$  (1.676 m), weight 221 lb 4.8 oz (100.381 kg), SpO2 100.00%.    HEENT: Neck without Lymphatics: No cervical, supraclavicular, or right axillary nodes. 3-4 mm mobile nodule (? Lymph node) in the subcutaneous tissue at the medial left axilla superior to the axillary scar and near the pectoral line  Resp: Lungs clear bilaterally Cardio: Regular rate and rhythm GI: No hepatomegaly Vascular: No leg edema Breast: Status post left lumpectomy. No mass in either breast.   Medications: I have reviewed the patient's current medications.  Assessment/Plan: 1.Stage I (T1 N0) left-sided breast cancer diagnosed in January of 1999, ER positive, PR positive, HER-2 negative, status post a left lumpectomy, left axillary lymph node dissection, and left breast radiation. She completed 5 years of adjuvant tamoxifen therapy in January of 2004.  2. Recurrent invasive breast cancer near the left lumpectomy scar-confirmed on a needle core biopsy 01/05/2012, the pathology is consistent with invasive breast cancer, ER positive, PR positive, HER-2 negative. ? Local recurrence versus a new breast primary  -breast MRI 01/17/2012 confirmed an isolated mass in the upper outer left breast  -bone scan on 01/19/2012-negative aside from an area of very subtle uptake in the inferior sternum without a CT correlate  -staging CTs of the chest, abdomen, and pelvis on 01/19/2012-negative for metastatic disease  -Partial  mastectomy 02/14/2012 confirmed a 1.5 cm grade 2 invasive carcinoma with associated DCIS and negative surgical margins  -Oncotype recurrence score-24  -Initiation of Femara after an office visit on 03/09/2012  3. Tiny cutaneous nodular lesion overlying the left clavicle when she was here on 03/09/2012-not noted today  4. Arthralgias-potentially related to Femara, tolerable at present  5. Pea-sized cutaneous nodular lesion near the left axillary scar-likely a benign finding, stable   Disposition:  Melissa [name] remains in clinical remission from breast cancer. She will continue Femara. She will schedule a mammogram for June of 2015. Melissa [name] will return for an office visit in 6 months. She will contact us in the interim for a palpable change at the breast or axilla.   Betsy Coder, MD  08/30/2013  11:49 AM

## 2013-08-31 ENCOUNTER — Telehealth: Payer: Self-pay | Admitting: Oncology

## 2013-08-31 NOTE — Telephone Encounter (Signed)
lvm forpt regardign to Aug appt....mailed pt appt sched and letter °

## 2013-09-17 ENCOUNTER — Other Ambulatory Visit: Payer: Self-pay | Admitting: *Deleted

## 2013-09-17 MED ORDER — LETROZOLE 2.5 MG PO TABS: 2.5000 mg | ORAL_TABLET | Freq: Every day | ORAL | Status: DC

## 2013-09-17 NOTE — Telephone Encounter (Signed)
Message from pt requesting Letrazole Rx to be sent to [place]. Same done.

## 2013-11-20 ENCOUNTER — Other Ambulatory Visit: Payer: Self-pay | Admitting: Internal Medicine

## 2013-11-20 DIAGNOSIS — Z9889 Other specified postprocedural states: Secondary | ICD-10-CM

## 2013-11-20 DIAGNOSIS — Z853 Personal history of malignant neoplasm of breast: Secondary | ICD-10-CM

## 2013-12-31 ENCOUNTER — Ambulatory Visit
Admission: RE | Admit: 2013-12-31 | Discharge: 2013-12-31 | Disposition: A | Discharge status: Home / Self Care | Payer: 59 | Source: Ambulatory Visit | Attending: Internal Medicine | Admitting: Internal Medicine

## 2013-12-31 ENCOUNTER — Encounter (INDEPENDENT_AMBULATORY_CARE_PROVIDER_SITE_OTHER): Payer: Self-pay

## 2013-12-31 DIAGNOSIS — Z853 Personal history of malignant neoplasm of breast: Secondary | ICD-10-CM

## 2013-12-31 DIAGNOSIS — Z9889 Other specified postprocedural states: Secondary | ICD-10-CM

## 2014-02-28 ENCOUNTER — Ambulatory Visit (HOSPITAL_BASED_OUTPATIENT_CLINIC_OR_DEPARTMENT_OTHER): Payer: 59 | Admitting: Oncology

## 2014-02-28 ENCOUNTER — Telehealth: Payer: Self-pay | Admitting: Oncology

## 2014-02-28 VITALS — BP 111/69 | HR 66 | Temp 98.2°F | Resp 18 | Ht 66.0 in | Wt 226.9 lb

## 2014-02-28 DIAGNOSIS — C50419 Malignant neoplasm of upper-outer quadrant of unspecified female breast: Secondary | ICD-10-CM

## 2014-02-28 DIAGNOSIS — Z170 Estrogen receptor positive status [ER+]: Secondary | ICD-10-CM

## 2014-02-28 DIAGNOSIS — L989 Disorder of the skin and subcutaneous tissue, unspecified: Secondary | ICD-10-CM

## 2014-02-28 DIAGNOSIS — M25569 Pain in unspecified knee: Secondary | ICD-10-CM

## 2014-02-28 DIAGNOSIS — M1710 Unilateral primary osteoarthritis, unspecified knee: Secondary | ICD-10-CM

## 2014-02-28 NOTE — Progress Notes (Signed)
  Melissa [name] OFFICE PROGRESS NOTE   Diagnosis: Breast cancer  INTERVAL HISTORY:   Melissa [name] returns as scheduled. She continues Femara. She reports malaise. Her chief complaint is bilateral knee stiffness and pain. She wonders whether this could be related to Femara. She has been taking less naproxen since this is recently been shown to cause "cardiac "toxicity. A bilateral mammogram 12/31/2013 was negative.  She has been placed on vaginal "estradiol "cream twice weekly for management of vaginal dryness and pain.  Objective:  Vital signs in last 24 hours:  Blood pressure 111/69, pulse 66, temperature 98.2 F (36.8 C), temperature source Oral, resp. rate 18, height _0  (1.676 m), weight 226 lb 14.4 oz (102.921 kg), SpO2 100.00%.    HEENT: Neck without mass Lymphatics: No cervical, supraclavicular, or right axillary nodes. Pea-sized mobile nodule in the left axilla near the medial aspect of the surgical scar. Resp: Lungs clear bilaterally Cardio: Regular rate and rhythm GI: No hepatomegaly Vascular: No leg edema Breasts: Status post left lumpectomy. No evidence for local tumor recurrence. No mass in either breast.  Musculoskeletal: There is crepitance with flexion/extension at both knees     Medications: I have reviewed the patient's current medications.  Assessment/Plan: 1.Stage I (T1 N0) left-sided breast cancer diagnosed in January of 1999, ER positive, PR positive, HER-2 negative, status post a left lumpectomy, left axillary lymph node dissection, and left breast radiation. She completed 5 years of adjuvant tamoxifen therapy in January of 2004.  2. Recurrent invasive breast cancer near the left lumpectomy scar-confirmed on a needle core biopsy 01/05/2012, the pathology is consistent with invasive breast cancer, ER positive, PR positive, HER-2 negative. ? Local recurrence versus a new breast primary  -breast MRI 01/17/2012 confirmed an isolated mass in the upper  outer left breast  -bone scan on 01/19/2012-negative aside from an area of very subtle uptake in the inferior sternum without a CT correlate  -staging CTs of the chest, abdomen, and pelvis on 01/19/2012-negative for metastatic disease  -Partial mastectomy 02/14/2012 confirmed a 1.5 cm grade 2 invasive carcinoma with associated DCIS and negative surgical margins  -Oncotype recurrence score-24  -Initiation of Femara after an office visit on 03/09/2012  3. Tiny cutaneous nodular lesion overlying the left clavicle when she was here on 03/09/2012-not noted today  4. Arthralgias-most likely related to degenerative arthritis-she requests an orthopedic referral 5. Pea-sized  nodular lesion near the left axillary scar-likely a benign finding, stable   Disposition:  Melissa [name] remains in clinical remission from breast cancer. She will continue Femara. She will return for an office visit in 6 months. We'll make an orthopedics referral to evaluate the knee pain.  She has been placed on a vaginal estrogen cream by her gynecologist. We discussed the increased risk of breast cancer associated with systemic estrogen exposure. She understands there is a low level systemic exposure to estrogen with vaginal estrogen preparations. She would like to proceed with the estrogen therapy.  Betsy Coder, MD  02/28/2014  12:15 PM

## 2014-02-28 NOTE — Telephone Encounter (Signed)
Pt confirmed labs/ov per 08/06 POF, gave pt AVS....KJ °

## 2014-03-01 ENCOUNTER — Other Ambulatory Visit: Payer: Self-pay | Admitting: *Deleted

## 2014-03-01 MED ORDER — LETROZOLE 2.5 MG PO TABS: 2.5000 mg | ORAL_TABLET | Freq: Every day | ORAL | Status: DC

## 2014-03-01 NOTE — Telephone Encounter (Signed)
Message from pt requesting 90 day supply on Femara. OK, Dr. Benay Spice. Rx sent to pharmacy. Pt aware.

## 2014-03-04 ENCOUNTER — Telehealth: Payer: Self-pay | Admitting: Oncology

## 2014-03-04 NOTE — Telephone Encounter (Signed)
Spk w/pt confirmed apt w/Dr. Gladstone Lighter for orthopedics per 08/06 POF referrall...Marland KitchenMarland KitchenKJ

## 2014-05-27 ENCOUNTER — Encounter (INDEPENDENT_AMBULATORY_CARE_PROVIDER_SITE_OTHER): Payer: Self-pay | Admitting: Surgery

## 2014-08-26 ENCOUNTER — Telehealth: Payer: Self-pay | Admitting: Oncology

## 2014-08-26 ENCOUNTER — Ambulatory Visit (HOSPITAL_BASED_OUTPATIENT_CLINIC_OR_DEPARTMENT_OTHER): Payer: 59 | Admitting: Oncology

## 2014-08-26 VITALS — BP 119/66 | HR 70 | Temp 98.6°F | Resp 20 | Ht 66.0 in | Wt 228.7 lb

## 2014-08-26 DIAGNOSIS — L989 Disorder of the skin and subcutaneous tissue, unspecified: Secondary | ICD-10-CM

## 2014-08-26 DIAGNOSIS — C50912 Malignant neoplasm of unspecified site of left female breast: Secondary | ICD-10-CM

## 2014-08-26 DIAGNOSIS — C50412 Malignant neoplasm of upper-outer quadrant of left female breast: Secondary | ICD-10-CM

## 2014-08-26 NOTE — Telephone Encounter (Signed)
gv and printed appt sched and avs for pt for Aug..[place] [place] [facility] [facility] not [facility] provider unless Dr Emiliano Dyer is willing to release and Dr. Maureen Ralphs is willing to accept they will contact pt with appt

## 2014-08-26 NOTE — Progress Notes (Signed)
  [facility] OFFICE PROGRESS NOTE   Diagnosis: Breast cancer  INTERVAL HISTORY:   Melissa [name] returns as scheduled. She continues Femara. She has hot flashes and arthralgias at the knees and ankles. She recently broke her right hand when closing the garage. She was placed in a cast. She reports intermittent tingling in the fingers bilaterally that improves with movement. No weakness.  No change over either breast.  Objective:  Vital signs in last 24 hours:  Blood pressure 119/66, pulse 70, temperature 98.6 F (37 C), temperature source Oral, resp. rate 20, height $RemoveBe'5\' 6"'rffKExiYs$  (1.676 m), weight 228 lb 11.2 oz (103.738 kg), SpO2 100 %.    HEENT: Neck without mass Lymphatics: No cervical, supraclavicular, or axillary nodes. Pea-sized mobile lesion overlying the mid left clavicle Resp: Lungs clear bilaterally Cardio: Regular rate and rhythm GI: No hepatomegaly Vascular: No leg edema Breast: Status post left lumpectomy. No evidence for local tumor recurrence. No mass in either breast. Neurologic: Good grip strength bilaterally, normal strength in the arms bilaterally  Medications: I have reviewed the patient's current medications.  Assessment/Plan: 1.Stage I (T1 N0) left-sided breast cancer diagnosed in January of 1999, ER positive, PR positive, HER-2 negative, status post a left lumpectomy, left axillary lymph node dissection, and left breast radiation. She completed 5 years of adjuvant tamoxifen therapy in January of 2004.  2. Recurrent invasive breast cancer near the left lumpectomy scar-confirmed on a needle core biopsy 01/05/2012, the pathology is consistent with invasive breast cancer, ER positive, PR positive, HER-2 negative. ? Local recurrence versus a new breast primary  -breast MRI 01/17/2012 confirmed an isolated mass in the upper outer left breast  -bone scan on 01/19/2012-negative aside from an area of very subtle uptake in the inferior sternum without a CT  correlate  -staging CTs of the chest, abdomen, and pelvis on 01/19/2012-negative for metastatic disease  -Partial mastectomy 02/14/2012 confirmed a 1.5 cm grade 2 invasive carcinoma with associated DCIS and negative surgical margins  -Oncotype recurrence score-24  -Initiation of Femara after an office visit on 03/09/2012  3. Tiny cutaneous nodular lesion overlying the left clavicle when she was here on 03/09/2012 and again today 4. Arthralgias-most likely related to degenerative arthritis-I will refer her back to orthopedics 5. Pea-sized nodular lesion near the left axillary noted on exam 02/28/2014-not appreciated today   Disposition:  There is no clinical evidence for progression of breast cancer. She will continue Femara. Melissa [name] will be scheduled for a mammogram in June. She will return for an office visit in 6 months. She will see orthopedics to evaluate the knee arthritis.  Melissa Coder, MD  08/26/2014  3:43 PM

## 2014-10-10 ENCOUNTER — Other Ambulatory Visit: Payer: Self-pay | Admitting: Gastroenterology

## 2014-11-22 ENCOUNTER — Other Ambulatory Visit: Payer: Self-pay

## 2014-11-26 ENCOUNTER — Other Ambulatory Visit: Payer: Self-pay | Admitting: *Deleted

## 2014-11-26 ENCOUNTER — Other Ambulatory Visit: Payer: Self-pay | Admitting: Internal Medicine

## 2014-11-26 DIAGNOSIS — Z853 Personal history of malignant neoplasm of breast: Secondary | ICD-10-CM

## 2014-12-05 ENCOUNTER — Ambulatory Visit: Payer: 59 | Admitting: Podiatry

## 2014-12-12 ENCOUNTER — Encounter: Payer: Self-pay | Admitting: Podiatry

## 2014-12-12 ENCOUNTER — Ambulatory Visit (INDEPENDENT_AMBULATORY_CARE_PROVIDER_SITE_OTHER): Payer: 59 | Admitting: Podiatry

## 2014-12-12 VITALS — BP 106/65 | HR 72 | Resp 12

## 2014-12-12 DIAGNOSIS — L603 Nail dystrophy: Secondary | ICD-10-CM

## 2014-12-12 DIAGNOSIS — L608 Other nail disorders: Secondary | ICD-10-CM | POA: Diagnosis not present

## 2014-12-12 DIAGNOSIS — M2010 Hallux valgus (acquired), unspecified foot: Secondary | ICD-10-CM | POA: Diagnosis not present

## 2014-12-12 NOTE — Progress Notes (Signed)
   Subjective:    Patient ID: Melissa [name], female    DOB: [birth year], 63 y.o.   MRN: [id number]  HPI PT STATED B/L TOENAILS ARE THICK AND SORE FOR 2 YEARS. THE TOENAILS ARE GETTING WORSE, ESPECIALLY WITH CLOSED SHOES/PRESSURE. TRIED NO TREATMENT.   Review of Systems  Skin: Positive for color change.       Objective:   Physical Exam: I have reviewed her past medical history medications allergy surgery social history and review of systems. Pulses are strongly palpable bilateral. Neurologic sensorium is intact per Semmes-Weinstein monofilament. Deep tendon reflexes are intact bilateral and muscle strength +5 over 5 dorsiflexion plantar flexors and inverters everters all intrinsic musculature is intact. Orthopedic evaluation demonstrates all joints distal to the ankle, full range of motion without crepitation. She does have moderate to severe hallux valgus deformities with hammertoe deformity second bilateral. Very early dislocation syndrome second metatarsophalangeal joint. I think this hammertoe deformity is resulting in a reactive nail dystrophy secondary bilaterally.        Assessment & Plan:  Assessment: Nail dystrophy secondary to bilateral. Hallux valgus deformity bilateral. Rule out onychomycosis.  Plan: A sample of the nail to be sent for pathologic evaluation. Once the results return and we will notify the patient.

## 2015-01-06 ENCOUNTER — Ambulatory Visit
Admission: RE | Admit: 2015-01-06 | Discharge: 2015-01-06 | Disposition: A | Discharge status: Home / Self Care | Payer: 59 | Source: Ambulatory Visit | Attending: Internal Medicine | Admitting: Internal Medicine

## 2015-01-06 DIAGNOSIS — Z853 Personal history of malignant neoplasm of breast: Secondary | ICD-10-CM

## 2015-02-07 ENCOUNTER — Telehealth: Payer: Self-pay | Admitting: *Deleted

## 2015-02-07 NOTE — Telephone Encounter (Signed)
Dr. Jacqualyn Posey reviewed [facility] fungal culture results and states pt needs an appt to discuss results and treatment.

## 2015-02-13 ENCOUNTER — Encounter: Payer: Self-pay | Admitting: Podiatry

## 2015-02-25 ENCOUNTER — Telehealth: Payer: Self-pay | Admitting: Oncology

## 2015-02-25 ENCOUNTER — Ambulatory Visit (HOSPITAL_BASED_OUTPATIENT_CLINIC_OR_DEPARTMENT_OTHER): Payer: 59 | Admitting: Oncology

## 2015-02-25 VITALS — BP 120/72 | HR 70 | Temp 98.3°F | Resp 20 | Ht 66.0 in | Wt 225.5 lb

## 2015-02-25 DIAGNOSIS — L989 Disorder of the skin and subcutaneous tissue, unspecified: Secondary | ICD-10-CM

## 2015-02-25 DIAGNOSIS — M2550 Pain in unspecified joint: Secondary | ICD-10-CM | POA: Diagnosis not present

## 2015-02-25 DIAGNOSIS — C50412 Malignant neoplasm of upper-outer quadrant of left female breast: Secondary | ICD-10-CM | POA: Diagnosis not present

## 2015-02-25 DIAGNOSIS — C50912 Malignant neoplasm of unspecified site of left female breast: Secondary | ICD-10-CM

## 2015-02-25 NOTE — Telephone Encounter (Signed)
Gave and printed appt sched and avs fo rpt for Feb 2017 °

## 2015-02-25 NOTE — Progress Notes (Signed)
  [facility] OFFICE PROGRESS NOTE   Diagnosis: Breast cancer  INTERVAL HISTORY:   [name] returns as scheduled. She continues Femara. She underwent a bilateral mammogram 01/06/2015. This was a negative study. She continues to have hot flashes. No palpable change in either breast. She sees orthopedics for bilateral knee pain. No other arthralgias.  Objective:  Vital signs in last 24 hours:  Blood pressure 120/72, pulse 70, temperature 98.3 F (36.8 C), temperature source Oral, resp. rate 20, height 5' 6" (1.676 m), weight 225 lb 8 oz (102.286 kg), SpO2 100 %.    HEENT: Neck without mass Lymphatics: No cervical, supraclavicular, or right axillary nodes. 2-3 mm mobile left axillary node. Mobile less than 1/2 cm lesion overlying the left clavicle. Resp: Lungs clear bilaterally Cardio: Regular rate and rhythm GI: No hepatomegaly Vascular: No leg edema  Breasts: Bilateral breast without mass    Medications: I have reviewed the patient's current medications.  Assessment/Plan: 1.Stage I (T1 N0) left-sided breast cancer diagnosed in January of 1999, ER positive, PR positive, HER-2 negative, status post a left lumpectomy, left axillary lymph node dissection, and left breast radiation. She completed 5 years of adjuvant tamoxifen therapy in January of 2004.  2. Recurrent invasive breast cancer near the left lumpectomy scar-confirmed on a needle core biopsy 01/05/2012, the pathology is consistent with invasive breast cancer, ER positive, PR positive, HER-2 negative. ? Local recurrence versus a new breast primary  -breast MRI 01/17/2012 confirmed an isolated mass in the upper outer left breast  -bone scan on 01/19/2012-negative aside from an area of very subtle uptake in the inferior sternum without a CT correlate  -staging CTs of the chest, abdomen, and pelvis on 01/19/2012-negative for metastatic disease  -Partial mastectomy 02/14/2012 confirmed a 1.5 cm grade 2  invasive carcinoma with associated DCIS and negative surgical margins  -Oncotype recurrence score-24  -Initiation of Femara after an office visit on 03/09/2012  3. Tiny cutaneous nodular lesion overlying the left clavicle when she was here on 03/09/2012 and again today 4. Arthralgias-most likely related to degenerative arthritis-I will refer her back to orthopedics 5. Pea-sized nodular lesion near the left axillary noted on exam 02/28/2014-not appreciated today    Disposition: Melissa [name] remains in clinical remission from breast cancer. She will continue Femara. She will return for an office visit in 6 months.  Betsy Coder, MD  02/25/2015  12:20 PM

## 2015-03-28 ENCOUNTER — Other Ambulatory Visit: Payer: Self-pay | Admitting: Oncology

## 2015-06-25 NOTE — Progress Notes (Signed)
Cardiology Office Note   Date:  06/26/2015   ID:  Melissa [name], DOB Apr 28, 1951, MRN [id number]  PCP:  Melissa Screws, MD  Cardiologist:   Melissa Harness, MD   Chief Complaint  Patient presents with  . New Evaluation    pt c/o SOB on exertion (strenuous exercise or climbing stairs)//no other Sx.      History of Present Illness: Melissa [name] is a 65 y.o. female with recurrent breast cancer status post lumpectomy and Melissa [name] presents for cardiovascular risk assessment.  Melissa [name] presents to establish her cardiovascular risk given that she has several family members who have died of cardiovascular disease. Her brother recently died of what was thought to be a myocardial infarction. Her father had a heart attack in his 47s and several paternal uncles had heart attacks in their 40s and subsequently requiring cardiac transplantation. Her paternal grandfather also had cardiovascular disease. She denies any history of chest pain but has noted exertional dyspnea that is getting gradually worse since this summer.  She especially notices this when walking up a hill or walking upstairs.  She exercises regularly in spinning classes 3 times per week, weight training once per week and has a Physiological scientist on the fifth day of the week. Despite this she notes that she is overall less physically active and she used to be.Since she is retired she is at home more often and not as physically active. She's noticed some weight gain that she attributes to less physical activity and snacking between meals. Sweets are also a weakness for her.  Melissa [name] denies any lower extremity edema, orthopnea, PND, lightheadedness, dizziness, or palpitations. She has noted some mild swelling of her hands and arms, especially on the left. She attributes this to prior lymph node dissection for breast cancer. She has not had any significant lymphedema.   Past Medical History  Diagnosis Date  . Breast cancer ([place])  07/23/97    left, tx w/xrt, Tamoxifen x 5 yrs  . Anemia   . Arthritis     Bilateral Knees  . History of bladder infections   . Recurrent breast cancer ([place]) 01/04/12    biopsy, ER/PR+, Her 2 -  . Hx of radiation therapy 08/22/97 - 10/03/97    left breast  . Chronic pain     left knee  . Shortness of breath 06/26/2015    Past Surgical History  Procedure Laterality Date  . Cesarean section      x 3  . Hernia repair  123456    umbilical   . Cholecystectomy  1993  . Ovary removed  1987  . Breast surgery  1999    lumpectomy-left  . Abdominal hysterectomy  1987    endometriosos  . Breast lumpectomy  02/14/2012    LUMPECTOMY;  Surgeon: Melissa Lasso, MD;  Location: [facility] OR;  Service: General;  Laterality: Left;     Current Outpatient Prescriptions  Medication Sig Dispense Refill  . calcium carbonate 1250 MG capsule Take 1,250 mg by mouth daily.    . Cholecalciferol (VITAMIN D3) 2000 UNITS TABS Take 1 tablet by mouth daily.    Marland Kitchen estradiol (ESTRACE) 0.1 MG/GM vaginal cream Place 5 g vaginally once a week. Once symptoms improve, decrease frequency of esterase to once weekly and further decrease based on symptoms    . letrozole (FEMARA) 2.5 MG tablet TAKE 1 TABLET BY MOUTH DAILY. 90 tablet 3  . Naproxen Sodium (ALEVE) 220 MG CAPS Take  220 mg by mouth as needed.    . trimethoprim (TRIMPEX) 100 MG tablet Take 100 mg by mouth 2 (two) times daily.     No current facility-administered medications for this visit.    Allergies:   Review of patient's allergies indicates no known allergies.    Social History:  The patient  reports that she has never smoked. She has never used smokeless tobacco. She reports that she does not drink alcohol or use illicit drugs.   Family History:  The patient's family history includes Cancer in her maternal aunt, maternal aunt, maternal grandfather, and paternal aunt; Cancer (age of onset: 65) in her mother; Heart attack in her brother, father, and paternal  grandfather; Heart failure in her sister; Prostate cancer in her maternal uncle.    ROS:  Please see the history of present illness.   Otherwise, review of systems are positive for none.   All other systems are reviewed and negative.    PHYSICAL EXAM: VS:  BP 108/76 mmHg  Pulse 74  Ht 5\' 6"  (1.676 m)  Wt 104.191 kg (229 lb 11.2 oz)  BMI 37.09 kg/m2 , BMI Body mass index is 37.09 kg/(m^2). GENERAL:  Well appearing HEENT:  Pupils equal round and reactive, fundi not visualized, oral mucosa unremarkable NECK:  No jugular venous distention, waveform within normal limits, carotid upstroke brisk and symmetric, no bruits, no thyromegaly LYMPHATICS:  No cervical adenopathy LUNGS:  Clear to auscultation bilaterally HEART:  RRR.  PMI not displaced or sustained,S1 and S2 within normal limits, no S3, no S4, no clicks, no rubs, no murmurs ABD:  Flat, positive bowel sounds normal in frequency in pitch, no bruits, no rebound, no guarding, no midline pulsatile mass, no hepatomegaly, no splenomegaly EXT:  2 plus pulses throughout, no edema, no cyanosis no clubbing SKIN:  No rashes no nodules NEURO:  Cranial nerves II through XII grossly intact, motor grossly intact throughout PSYCH:  Cognitively intact, oriented to person place and time    EKG:  EKG is ordered today. The ekg ordered today demonstrates sinus rhythm rate 74 bpm. Left axis deviation. Inferolateral T-wave inversions with less than 1 mm ST depressions in leads 3, aVF, V3-V6.   Recent Labs: No results found for requested labs within last 365 days.    Lipid Panel No results found for: CHOL, TRIG, HDL, CHOLHDL, VLDL, LDLCALC, LDLDIRECT    Wt Readings from Last 3 Encounters:  06/26/15 104.191 kg (229 lb 11.2 oz)  02/25/15 102.286 kg (225 lb 8 oz)  08/26/14 103.738 kg (228 lb 11.2 oz)      ASSESSMENT AND PLAN:  # CV Risk assessment: [name] has several first degree family members with premature coronary artery disease.  She has  not been experiencing any chest pain but has noticed exertional dyspnea. Given her family history, dyspnea and EKG changes, we will pursue exercise Cardiolite stress testing to evaluate for ischemia. I do not have access to her lipid panel, but she reports that Dr. Inda Merlin recently checked and her cholesterol levels have been excellent. We will wait to obtain these levels prior to determining the need for aspirin or statin therapy.  Given her extensive history of cardiac disease, if her stress test is normal we may consider coronary calcium scoring to better risk stratify.  Her blood pressure is currently well-controlled. We discussed the importance of limiting dietary indiscretion and weight loss. She was congratulated on her excellent exercise regimen.  # Shortness of breath:  Mrs. Nase's dyspnea  on exertion will be evaluated for ischemia as above. She does not have any physical exam findings consistent with heart failure.  We will obtain a transthoracic echo to evaluate for diastolic dysfunction. There is no evidence of valvular heart disease on exam.   Current medicines are reviewed at length with the patient today.  The patient does not have concerns regarding medicines.  The following changes have been made:  no change  Labs/ tests ordered today include:   Orders Placed This Encounter  Procedures  . Myocardial Perfusion Imaging  . EKG 12-Lead  . ECHOCARDIOGRAM COMPLETE     Disposition:   FU with Dion Sibal C. Oval Linsey, MD, [facility] in 1 month.   Signed, Sherian Valenza C. Oval Linsey, MD, [facility]  06/26/2015 1:27 PM    [facility]

## 2015-06-26 ENCOUNTER — Encounter: Payer: Self-pay | Admitting: Cardiovascular Disease

## 2015-06-26 ENCOUNTER — Ambulatory Visit (INDEPENDENT_AMBULATORY_CARE_PROVIDER_SITE_OTHER): Payer: 59 | Admitting: Cardiovascular Disease

## 2015-06-26 VITALS — BP 108/76 | HR 74 | Ht 66.0 in | Wt 229.7 lb

## 2015-06-26 DIAGNOSIS — R9431 Abnormal electrocardiogram [ECG] [EKG]: Secondary | ICD-10-CM

## 2015-06-26 DIAGNOSIS — R0602 Shortness of breath: Secondary | ICD-10-CM

## 2015-06-26 DIAGNOSIS — I351 Nonrheumatic aortic (valve) insufficiency: Secondary | ICD-10-CM

## 2015-06-26 DIAGNOSIS — I340 Nonrheumatic mitral (valve) insufficiency: Secondary | ICD-10-CM

## 2015-06-26 DIAGNOSIS — I071 Rheumatic tricuspid insufficiency: Secondary | ICD-10-CM

## 2015-06-26 HISTORY — DX: Shortness of breath: R06.02

## 2015-06-26 HISTORY — DX: Rheumatic tricuspid insufficiency: I07.1

## 2015-06-26 HISTORY — DX: Nonrheumatic mitral (valve) insufficiency: I34.0

## 2015-06-26 HISTORY — DX: Nonrheumatic aortic (valve) insufficiency: I35.1

## 2015-06-26 NOTE — Patient Instructions (Signed)
Medication Instructions:  Your physician recommends that you continue on your current medications as directed. Please refer to the Current Medication list given to you today.  Labwork: NONE  Testing/Procedures: Your physician has requested that you have en exercise stress myoview. For further information please visit [email or web address]. Please follow instruction sheet, as given.  Your physician has requested that you have an echocardiogram. Echocardiography is a painless test that uses sound waves to create images of your heart. It provides your doctor with information about the size and shape of your heart and how well your heart's chambers and valves are working. This procedure takes approximately one hour. There are no restrictions for this procedure.  Follow-Up: Your physician recommends that you schedule a follow-up appointment in: [place]  If you need a refill on your cardiac medications before your next appointment, please call your pharmacy.

## 2015-07-07 ENCOUNTER — Telehealth: Payer: Self-pay | Admitting: Cardiovascular Disease

## 2015-07-07 NOTE — Telephone Encounter (Signed)
New message  Pt called req a call back to discuss being able to exercise before the ECHO. Please call back to discuss

## 2015-07-07 NOTE — Telephone Encounter (Signed)
Refill dept does not handle such requests. Thanks.

## 2015-07-08 ENCOUNTER — Ambulatory Visit (HOSPITAL_COMMUNITY): Payer: 59 | Attending: Cardiology

## 2015-07-08 ENCOUNTER — Other Ambulatory Visit: Payer: Self-pay

## 2015-07-08 DIAGNOSIS — I340 Nonrheumatic mitral (valve) insufficiency: Secondary | ICD-10-CM | POA: Diagnosis not present

## 2015-07-08 DIAGNOSIS — I77810 Thoracic aortic ectasia: Secondary | ICD-10-CM | POA: Insufficient documentation

## 2015-07-08 DIAGNOSIS — Z6837 Body mass index (BMI) 37.0-37.9, adult: Secondary | ICD-10-CM | POA: Diagnosis not present

## 2015-07-08 DIAGNOSIS — I071 Rheumatic tricuspid insufficiency: Secondary | ICD-10-CM | POA: Insufficient documentation

## 2015-07-08 DIAGNOSIS — I351 Nonrheumatic aortic (valve) insufficiency: Secondary | ICD-10-CM | POA: Insufficient documentation

## 2015-07-08 DIAGNOSIS — E669 Obesity, unspecified: Secondary | ICD-10-CM | POA: Diagnosis not present

## 2015-07-08 DIAGNOSIS — R0602 Shortness of breath: Secondary | ICD-10-CM | POA: Diagnosis not present

## 2015-07-08 NOTE — Telephone Encounter (Signed)
Called patient about her question. Patient has already exercised this morning. Informed patient that it should be fine to exercise the morning of her echo. Patient verbalized understanding.

## 2015-07-08 NOTE — Telephone Encounter (Signed)
Re routing to [facility]

## 2015-07-10 ENCOUNTER — Telehealth (HOSPITAL_COMMUNITY): Payer: Self-pay

## 2015-07-10 NOTE — Telephone Encounter (Signed)
Encounter complete. 

## 2015-07-15 ENCOUNTER — Ambulatory Visit (HOSPITAL_COMMUNITY)
Admission: RE | Admit: 2015-07-15 | Discharge: 2015-07-15 | Disposition: A | Discharge status: Home / Self Care | Payer: 59 | Source: Ambulatory Visit | Attending: Cardiology | Admitting: Cardiology

## 2015-07-15 DIAGNOSIS — R0602 Shortness of breath: Secondary | ICD-10-CM

## 2015-07-15 DIAGNOSIS — R9431 Abnormal electrocardiogram [ECG] [EKG]: Secondary | ICD-10-CM | POA: Diagnosis not present

## 2015-07-15 MED ORDER — REGADENOSON 0.4 MG/5ML IV SOLN
0.4000 mg | Freq: Once | INTRAVENOUS | Status: AC
  Administered 2015-07-15: 0.4 mg via INTRAVENOUS

## 2015-07-15 MED ORDER — TECHNETIUM TC 99M SESTAMIBI GENERIC - CARDIOLITE
29.0000 | Freq: Once | INTRAVENOUS | Status: AC | PRN
  Administered 2015-07-15: 29 via INTRAVENOUS

## 2015-07-16 ENCOUNTER — Telehealth: Payer: Self-pay | Admitting: *Deleted

## 2015-07-16 ENCOUNTER — Ambulatory Visit (HOSPITAL_COMMUNITY)
Admission: RE | Admit: 2015-07-16 | Discharge: 2015-07-16 | Disposition: A | Discharge status: Home / Self Care | Payer: 59 | Source: Ambulatory Visit | Attending: Cardiovascular Disease | Admitting: Cardiovascular Disease

## 2015-07-16 LAB — MYOCARDIAL PERFUSION IMAGING
CHL CUP MPHR: 157 {beats}/min
CHL CUP NUCLEAR SDS: 4
CHL CUP NUCLEAR SRS: 3
CHL CUP RESTING HR STRESS: 66 {beats}/min
CHL RATE OF PERCEIVED EXERTION: 16
CSEPED: 7 min
CSEPPHR: 151 {beats}/min
Estimated workload: 8.5 METS
Exercise duration (sec): 1 s
LV dias vol: 100 mL
LV sys vol: 42 mL
NUC STRESS TID: 0.98
Percent HR: 96 %
SSS: 7

## 2015-07-16 MED ORDER — TECHNETIUM TC 99M SESTAMIBI GENERIC - CARDIOLITE
30.9000 | Freq: Once | INTRAVENOUS | Status: AC | PRN
  Administered 2015-07-16: 30.9 via INTRAVENOUS

## 2015-07-16 NOTE — Telephone Encounter (Signed)
Left message to call back  

## 2015-07-16 NOTE — Telephone Encounter (Signed)
-----   Message from Skeet Latch, MD sent at 07/16/2015  3:32 PM EST ----- Normal stress test.

## 2015-07-16 NOTE — Telephone Encounter (Signed)
-----   Message from Skeet Latch, MD sent at 07/09/2015 10:54 AM EST ----- Echo shows that her heart does not relax completely.  It is possible that this is contributing to her shortness of breath, but unlikely. There was also mild leaking of the mitral valve and tricuspid valve.

## 2015-07-18 NOTE — Telephone Encounter (Signed)
Left message to call back  

## 2015-07-22 ENCOUNTER — Telehealth: Payer: Self-pay | Admitting: Cardiovascular Disease

## 2015-07-22 NOTE — Telephone Encounter (Signed)
Correction-- records received from [facility] I did not place records in chart prep  These were faxed to [facility].

## 2015-07-22 NOTE — Telephone Encounter (Signed)
ROI faxed to [facility] office records received back placed in chart prep bin.

## 2015-07-23 NOTE — Telephone Encounter (Signed)
Spoke to patient's husband. Result given . Verbalized understanding  

## 2015-07-24 ENCOUNTER — Telehealth: Payer: Self-pay | Admitting: Cardiovascular Disease

## 2015-07-24 NOTE — Telephone Encounter (Signed)
Received records from [facility] - Dr Josetta Huddle for appointment on 09/01/15 with Dr Oval Linsey.  Records given to [facility] (medical records) for Dr Blenda Mounts schedule on 09/01/15. lp

## 2015-08-27 DIAGNOSIS — R3129 Other microscopic hematuria: Secondary | ICD-10-CM | POA: Diagnosis not present

## 2015-08-27 DIAGNOSIS — E669 Obesity, unspecified: Secondary | ICD-10-CM | POA: Diagnosis not present

## 2015-08-27 DIAGNOSIS — C50919 Malignant neoplasm of unspecified site of unspecified female breast: Secondary | ICD-10-CM | POA: Diagnosis not present

## 2015-08-27 DIAGNOSIS — Z0001 Encounter for general adult medical examination with abnormal findings: Secondary | ICD-10-CM | POA: Diagnosis not present

## 2015-08-27 DIAGNOSIS — E559 Vitamin D deficiency, unspecified: Secondary | ICD-10-CM | POA: Diagnosis not present

## 2015-08-27 DIAGNOSIS — M179 Osteoarthritis of knee, unspecified: Secondary | ICD-10-CM | POA: Diagnosis not present

## 2015-08-27 DIAGNOSIS — D649 Anemia, unspecified: Secondary | ICD-10-CM | POA: Diagnosis not present

## 2015-08-27 DIAGNOSIS — Z8249 Family history of ischemic heart disease and other diseases of the circulatory system: Secondary | ICD-10-CM | POA: Diagnosis not present

## 2015-08-27 DIAGNOSIS — Z23 Encounter for immunization: Secondary | ICD-10-CM | POA: Diagnosis not present

## 2015-08-28 NOTE — Progress Notes (Signed)
Cardiology Office Note   Date:  09/01/2015   ID:  Melissa [name], DOB 1950/10/03, MRN [id number]  PCP:  Henrine Screws, MD  Cardiologist:   Sharol Harness, MD   Chief Complaint  Patient presents with  . Follow-up    post echo/exercise myoview//pt c/o SOB during exercise, no other Sx.      Patient ID: Melissa [name] is a 65 y.o. female with recurrent breast cancer status post lumpectomy and Melissa [name] presents for cardiovascular risk assessment.    Interval History 09/01/15: After her last appointment, Melissa [name] was referred for echo that revealed LVEF 55-60% and grade 1 diastolic dysfunction.  Exercise Cardiolite was negative for ischemia.  She has been doing well.  She denies any chest pain, shortness of breath, orthopnea or PND.  She reports a cold that has been lingering but is otherwise well.  She continues to exercise 5 days per week without limitiation.  She does note pain in bilateral knees and is awaiting injections in her knees.  Ultimately, at the least the L knee will need to be replaced.  Melissa [name] reports having difficulty with her diet.  She has been trying to reduce her carbohydrate intake and eat more fruits and vegetables.  She has been substituting dessert for a fruit and is working with her husband to change their diet.  History of Present Illness 06/26/15: Melissa [name] presents to establish her cardiovascular risk given that she has several family members who have died of cardiovascular disease. Her brother recently died of what was thought to be a myocardial infarction. Her father had a heart attack in his 61s and several paternal uncles had heart attacks in their 31s and subsequently required cardiac transplantation. Her paternal grandfather also had cardiovascular disease. She denies any history of chest pain but has noted exertional dyspnea that is getting gradually worse since this summer.  She especially notices this when walking up a hill or walking upstairs.   She exercises regularly in spinning classes 3 times per week, weight training once per week and has a Physiological scientist on the fifth day of the week. Despite this she notes that she is overall less physically active than she used to be.Since she is retired she is at home more often and not as physically active. She's noticed some weight gain that she attributes to less physical activity and snacking between meals. Sweets are also a weakness for her.  Melissa [name] denies any lower extremity edema, orthopnea, PND, lightheadedness, dizziness, or palpitations. She has noted some mild swelling of her hands and arms, especially on the left. She attributes this to prior lymph node dissection for breast cancer. She has not had any significant lymphedema.   Past Medical History  Diagnosis Date  . Breast cancer ([place]) 07/23/97    left, tx w/xrt, Tamoxifen x 5 yrs  . Anemia   . Arthritis     Bilateral Knees  . History of bladder infections   . Recurrent breast cancer ([place]) 01/04/12    biopsy, ER/PR+, Her 2 -  . Hx of radiation therapy 08/22/97 - 10/03/97    left breast  . Chronic pain     left knee  . Shortness of breath 06/26/2015  . Diastolic dysfunction without heart failure 09/01/2015    Grade 1 diastolic dysfunction on echo 06/2015    Past Surgical History  Procedure Laterality Date  . Cesarean section      x 3  . Hernia repair  123456    umbilical   . Cholecystectomy  1993  . Ovary removed  1987  . Breast surgery  1999    lumpectomy-left  . Abdominal hysterectomy  1987    endometriosos  . Breast lumpectomy  02/14/2012    LUMPECTOMY;  Surgeon: Haywood Lasso, MD;  Location: [facility] OR;  Service: General;  Laterality: Left;     Current Outpatient Prescriptions  Medication Sig Dispense Refill  . calcium carbonate 1250 MG capsule Take 1,250 mg by mouth daily.    . Cholecalciferol (VITAMIN D3) 2000 UNITS TABS Take 1 tablet by mouth daily.    Marland Kitchen estradiol (ESTRACE) 0.1 MG/GM vaginal cream Place 5  g vaginally once a week. Once symptoms improve, decrease frequency of esterase to once weekly and further decrease based on symptoms    . letrozole (FEMARA) 2.5 MG tablet TAKE 1 TABLET BY MOUTH DAILY. 90 tablet 3  . Naproxen Sodium (ALEVE) 220 MG CAPS Take 220 mg by mouth as needed.    . trimethoprim (TRIMPEX) 100 MG tablet Take 100 mg by mouth 2 (two) times daily.     No current facility-administered medications for this visit.    Allergies:   Review of patient's allergies indicates no known allergies.    Social History:  The patient  reports that she has never smoked. She has never used smokeless tobacco. She reports that she does not drink alcohol or use illicit drugs.   Family History:  The patient's family history includes Cancer in her maternal aunt, maternal aunt, maternal grandfather, and paternal aunt; Cancer (age of onset: 98) in her mother; Heart attack in her brother, father, and paternal grandfather; Heart failure in her sister; Prostate cancer in her maternal uncle.    ROS:  Please see the history of present illness.   Otherwise, review of systems are positive for none.   All other systems are reviewed and negative.    PHYSICAL EXAM: VS:  BP 123/76 mmHg  Pulse 69  Ht 5' 6.5" (1.689 m)  Wt 105.144 kg (231 lb 12.8 oz)  BMI 36.86 kg/m2 , BMI Body mass index is 36.86 kg/(m^2). GENERAL:  Well appearing HEENT:  Pupils equal round and reactive, fundi not visualized, oral mucosa unremarkable NECK:  No jugular venous distention, waveform within normal limits, carotid upstroke brisk and symmetric, no bruits LYMPHATICS:  No cervical adenopathy LUNGS:  Clear to auscultation bilaterally HEART:  RRR.  PMI not displaced or sustained,S1 and S2 within normal limits, no S3, no S4, no clicks, no rubs, no murmurs ABD:  Flat, positive bowel sounds normal in frequency in pitch, no bruits, no rebound, no guarding, no midline pulsatile mass, no hepatomegaly, no splenomegaly EXT:  2 plus pulses  throughout, no edema, no cyanosis no clubbing SKIN:  No rashes no nodules NEURO:  Cranial nerves II through XII grossly intact, motor grossly intact throughout PSYCH:  Cognitively intact, oriented to person place and time   EKG:  EKG is not ordered today.  Echo 07/08/15: Study Conclusions  - Left ventricle: The cavity size was normal. Wall thickness was normal. Systolic function was normal. The estimated ejection fraction was in the range of 55% to 60%. Wall motion was normal; there were no regional wall motion abnormalities. Doppler parameters are consistent with abnormal left ventricular relaxation (grade 1 diastolic dysfunction). - Aortic valve: There was no stenosis. There was trivial regurgitation. - Aorta: Mildly dilated aortic root. Aortic root dimension: 38 mm (ED). - Mitral valve: There was mild regurgitation. -  Right ventricle: The cavity size was normal. Systolic function was normal. - Tricuspid valve: Peak RV-RA gradient (S): 29 mm Hg. - Pulmonary arteries: PA peak pressure: 32 mm Hg (S). - Inferior vena cava: The vessel was normal in size. The respirophasic diameter changes were in the normal range (>= 50%), consistent with normal central venous pressure.  Impressions:  - Normal LV size with EF 55-60%. Normal RV size and systolic function. Mild mitral regurgitation and mild tricuspid regurgitation.  Exercise Cardiolite 07/16/15:  The left ventricular ejection fraction is normal (55-65%).  Nuclear stress EF: 57%.  There was no ST segment deviation noted during stress.  This is a low risk study.  Low risk stress nuclear study with a small, mild, fixed apical defect consistent with soft tissue attenuation/apical thinning; no ischemia; EF 57 with normal wall motion.   Recent Labs: No results found for requested labs within last 365 days.    Lipid Panel No results found for: CHOL, TRIG, HDL, CHOLHDL, VLDL, LDLCALC,  LDLDIRECT 08/26/14: Chol 181, HDL 62, LDL 98   Wt Readings from Last 3 Encounters:  09/01/15 105.144 kg (231 lb 12.8 oz)  07/15/15 103.874 kg (229 lb)  06/26/15 104.191 kg (229 lb 11.2 oz)      ASSESSMENT AND PLAN:  # CV Risk assessment: Mrs. Froberg has several first degree family members with premature coronary artery disease. She remains symptom free.  Her lipids and stress test are reassuring.  No wall motion abnormalities were noted on echo and her LVEF is within normal limits.  ASCVD 10 year risk is 3.8%.  We discussed the fact that it would not be unreasonable to take ASA 81mg  daily given her family history and age, but this is not specifically regulated by the guidelines.  Given that she takes naproxen for her knees, this may cause more harm than benefit.  # Diastolic dysfunction: Echo revealed grade 1 diastolic dysfunction.  She has no evidence of heart failure on exam and I do not suspect that this is related to her shortness of breath.  We will continue to monitor over time.   # Obesity: Mrs. Miccio was congratulated on her excellent exercise routine.  She was encouraged to continue working on her diet and reducing caloric intake.  Current medicines are reviewed at length with the patient today.  The patient does not have concerns regarding medicines.  The following changes have been made:  no change  Labs/ tests ordered today include:   No orders of the defined types were placed in this encounter.     Disposition:   FU with Carlisa Eble C. Oval Linsey, MD, [facility] in 1 year.   Signed, Heaven Wandell C. Oval Linsey, MD, [facility]  09/01/2015 1:46 PM    [place]

## 2015-09-01 ENCOUNTER — Encounter: Payer: Self-pay | Admitting: Cardiovascular Disease

## 2015-09-01 ENCOUNTER — Ambulatory Visit (INDEPENDENT_AMBULATORY_CARE_PROVIDER_SITE_OTHER): Payer: 59 | Admitting: Cardiovascular Disease

## 2015-09-01 VITALS — BP 123/76 | HR 69 | Ht 66.5 in | Wt 231.8 lb

## 2015-09-01 DIAGNOSIS — E669 Obesity, unspecified: Secondary | ICD-10-CM | POA: Diagnosis not present

## 2015-09-01 DIAGNOSIS — I519 Heart disease, unspecified: Secondary | ICD-10-CM

## 2015-09-01 DIAGNOSIS — I5189 Other ill-defined heart diseases: Secondary | ICD-10-CM

## 2015-09-01 HISTORY — DX: Other ill-defined heart diseases: I51.89

## 2015-09-01 NOTE — Patient Instructions (Signed)
Dr [place] recommends that you schedule a follow-up appointment in 1 year. You will receive a reminder letter in the mail two months in advance. If you don't receive a letter, please call our office to schedule the follow-up appointment.  If you need a refill on your cardiac medications before your next appointment, please call your pharmacy. 

## 2015-09-09 ENCOUNTER — Ambulatory Visit: Payer: 59 | Admitting: Oncology

## 2015-09-12 DIAGNOSIS — M2141 Flat foot [pes planus] (acquired), right foot: Secondary | ICD-10-CM | POA: Diagnosis not present

## 2015-09-12 DIAGNOSIS — M170 Bilateral primary osteoarthritis of knee: Secondary | ICD-10-CM | POA: Diagnosis not present

## 2015-09-12 DIAGNOSIS — M2142 Flat foot [pes planus] (acquired), left foot: Secondary | ICD-10-CM | POA: Diagnosis not present

## 2015-10-06 DIAGNOSIS — M2141 Flat foot [pes planus] (acquired), right foot: Secondary | ICD-10-CM | POA: Diagnosis not present

## 2015-10-06 DIAGNOSIS — M2142 Flat foot [pes planus] (acquired), left foot: Secondary | ICD-10-CM | POA: Diagnosis not present

## 2015-10-09 DIAGNOSIS — M6289 Other specified disorders of muscle: Secondary | ICD-10-CM | POA: Diagnosis not present

## 2015-10-13 MED FILL — LETROZOLE 2.5 MG TABLET: 2.5 | 90 days supply | Qty: 90 | Fill #2

## 2015-10-14 DIAGNOSIS — M6289 Other specified disorders of muscle: Secondary | ICD-10-CM | POA: Diagnosis not present

## 2015-10-16 DIAGNOSIS — M6289 Other specified disorders of muscle: Secondary | ICD-10-CM | POA: Diagnosis not present

## 2015-10-21 DIAGNOSIS — M6281 Muscle weakness (generalized): Secondary | ICD-10-CM | POA: Diagnosis not present

## 2015-10-23 DIAGNOSIS — M6289 Other specified disorders of muscle: Secondary | ICD-10-CM | POA: Diagnosis not present

## 2015-11-04 DIAGNOSIS — M6281 Muscle weakness (generalized): Secondary | ICD-10-CM | POA: Diagnosis not present

## 2015-11-06 DIAGNOSIS — M170 Bilateral primary osteoarthritis of knee: Secondary | ICD-10-CM | POA: Diagnosis not present

## 2015-11-27 ENCOUNTER — Telehealth: Payer: Self-pay | Admitting: *Deleted

## 2015-11-27 NOTE — Telephone Encounter (Signed)
Pt reports she had been working with a trainer and using weights once per week. She has avoided this for the past week. Pain is constant and exacerbated by lifting arm or reaching behind her back. Aleve helps somewhat. Her PCP is out of the office. Noted pt failed to keep appt in Feb.  Discussed with Dr. Benay Spice: Appt given for 5/9 at 0930.

## 2015-11-27 NOTE — Telephone Encounter (Signed)
TC from patient this morning regarding pain she is having in her left upper arm-between her elbow and her shoulder.  This is the same side she had her breast surgery on. She is not sure if it is related to that or not.  She states it is painful when she reaches up over her head and when she reaches back to unhook her bra.  She states that she has been working out and [employer]. If she has pain with that she rests and takes ibuprofen with relief of pain. However that is not working now. She has not lifted weights in over a week and the pain is still present.  She would like to know what she should do  from Dr. Benay Spice.

## 2015-12-01 DIAGNOSIS — M2142 Flat foot [pes planus] (acquired), left foot: Secondary | ICD-10-CM | POA: Diagnosis not present

## 2015-12-01 DIAGNOSIS — M2141 Flat foot [pes planus] (acquired), right foot: Secondary | ICD-10-CM | POA: Diagnosis not present

## 2015-12-01 MED FILL — TRIMETHOPRIM 100 MG TABLET: 100 | 90 days supply | Qty: 180 | Fill #0

## 2015-12-02 ENCOUNTER — Ambulatory Visit (HOSPITAL_BASED_OUTPATIENT_CLINIC_OR_DEPARTMENT_OTHER): Payer: 59 | Admitting: Oncology

## 2015-12-02 ENCOUNTER — Telehealth: Payer: Self-pay | Admitting: Oncology

## 2015-12-02 ENCOUNTER — Telehealth: Payer: Self-pay | Admitting: *Deleted

## 2015-12-02 ENCOUNTER — Ambulatory Visit (HOSPITAL_COMMUNITY)
Admission: RE | Admit: 2015-12-02 | Discharge: 2015-12-02 | Disposition: A | Discharge status: Home / Self Care | Payer: 59 | Source: Ambulatory Visit | Attending: Oncology | Admitting: Oncology

## 2015-12-02 VITALS — BP 116/71 | HR 65 | Temp 98.1°F | Resp 20 | Ht 66.5 in | Wt 231.0 lb

## 2015-12-02 DIAGNOSIS — C50911 Malignant neoplasm of unspecified site of right female breast: Secondary | ICD-10-CM | POA: Diagnosis not present

## 2015-12-02 DIAGNOSIS — R938 Abnormal findings on diagnostic imaging of other specified body structures: Secondary | ICD-10-CM | POA: Diagnosis not present

## 2015-12-02 DIAGNOSIS — M25512 Pain in left shoulder: Secondary | ICD-10-CM | POA: Diagnosis not present

## 2015-12-02 DIAGNOSIS — C50412 Malignant neoplasm of upper-outer quadrant of left female breast: Secondary | ICD-10-CM

## 2015-12-02 DIAGNOSIS — M79602 Pain in left arm: Secondary | ICD-10-CM | POA: Diagnosis not present

## 2015-12-02 NOTE — Telephone Encounter (Signed)
Gave and printed appt sched and avs fo rpt; for NOV  °

## 2015-12-02 NOTE — Telephone Encounter (Signed)
-----   Message from Ladell Pier, MD sent at 12/02/2015  4:15 PM EDT ----- Please call patient, xray show arthritic change of left shoulder with possible rotator cuff disease, no fracture or cancer, call for persistent pain

## 2015-12-02 NOTE — Progress Notes (Signed)
  [facility] OFFICE PROGRESS NOTE   Diagnosis: Breast cancer  INTERVAL HISTORY:    Ms. Gartner missed a scheduled visit earlier this year. She returns today with a complaint of discomfort at the left upper arm for the past 3 weeks. No trauma. The pain is present with certain movements. No breast pain. No palpable change. She continues Femara.  Objective:  Vital signs in last 24 hours:  Blood pressure 116/71, pulse 65, temperature 98.1 F (36.7 C), temperature source Oral, resp. rate 20, height 5' 6.5" (1.689 m), weight 231 lb (104.781 kg), SpO2 100 %.    HEENT:  Neck without mass Lymphatics:  No cervical, supraclavicular, or axillary nodes. 2-3 mm mobile cutaneous nodule versus lymph node near the left axillary scar Resp:  Lungs clear bilaterally Cardio:  Regular rate and rhythm GI:  No hepatomegaly Vascular:  No leg edema  breast:  Bilateral breast without mass   musculoskeletal: discomfort with abduction and posterior extension at the left shoulder. The discomfort is at the left mid arm. No swelling. No rash. No palpable abnormality.  Medications: I have reviewed the patient's current medications.  Assessment/Plan: 1.Stage I (T1 N0) left-sided breast cancer diagnosed in January of 1999, ER positive, PR positive, HER-2 negative, status post a left lumpectomy, left axillary lymph node dissection, and left breast radiation. She completed 5 years of adjuvant tamoxifen therapy in January of 2004.  2. Recurrent invasive breast cancer near the left lumpectomy scar-confirmed on a needle core biopsy 01/05/2012, the pathology is consistent with invasive breast cancer, ER positive, PR positive, HER-2 negative. ? Local recurrence versus a new breast primary  -breast MRI 01/17/2012 confirmed an isolated mass in the upper outer left breast  -bone scan on 01/19/2012-negative aside from an area of very subtle uptake in the inferior sternum without a CT correlate  -staging CTs of  the chest, abdomen, and pelvis on 01/19/2012-negative for metastatic disease  -Partial mastectomy 02/14/2012 confirmed a 1.5 cm grade 2 invasive carcinoma with associated DCIS and negative surgical margins  -Oncotype recurrence score-24  -Initiation of Femara after an office visit on 03/09/2012  3. Tiny cutaneous nodular lesion overlying the left clavicle when she was here on 03/09/2012 and again today 4. Arthralgias-most likely related to degenerative arthritis-I will refer her back to orthopedics 5. Pea-sized nodular lesion near the left axillary noted on exam 02/28/2014 and 12/02/2015 6.  Left arm discomfort May 2017   Disposition:   Ms. Bialas has a history of recurrent breast cancer. She is maintained on Femara. She remains in clinical remission.  I suspect the left arm discomfort is related to a benign musculoskeletal condition. We will obtain plain x-rays of the left arm and shoulder today.  She will contact us for persistent pain and we will make an orthopedic referral  and consider a bone scan.   she will be scheduled for an office visit in 6 months. We will see her sooner as needed.  Betsy Coder, MD  12/02/2015  9:51 AM

## 2015-12-02 NOTE — Telephone Encounter (Signed)
Per Dr. Benay Spice, pt.'s husband notified that xray showed arthritic change of left shoulder with possible rotator cuff disease, no fracture or cancer and to call for persistent pain. Pt.'s husband appreciative of call and has no questions at this time.

## 2015-12-09 ENCOUNTER — Other Ambulatory Visit: Payer: Self-pay | Admitting: Obstetrics and Gynecology

## 2015-12-09 DIAGNOSIS — Z853 Personal history of malignant neoplasm of breast: Secondary | ICD-10-CM

## 2016-01-05 DIAGNOSIS — N951 Menopausal and female climacteric states: Secondary | ICD-10-CM | POA: Diagnosis not present

## 2016-01-05 DIAGNOSIS — C50919 Malignant neoplasm of unspecified site of unspecified female breast: Secondary | ICD-10-CM | POA: Diagnosis not present

## 2016-01-05 DIAGNOSIS — R319 Hematuria, unspecified: Secondary | ICD-10-CM | POA: Diagnosis not present

## 2016-01-05 DIAGNOSIS — Z01419 Encounter for gynecological examination (general) (routine) without abnormal findings: Secondary | ICD-10-CM | POA: Diagnosis not present

## 2016-01-05 DIAGNOSIS — N952 Postmenopausal atrophic vaginitis: Secondary | ICD-10-CM | POA: Diagnosis not present

## 2016-01-06 MED FILL — ESTRACE 0.01% CREAM: 0.1 | 90 days supply | Qty: 43 | Fill #0

## 2016-01-07 ENCOUNTER — Ambulatory Visit
Admission: RE | Admit: 2016-01-07 | Discharge: 2016-01-07 | Disposition: A | Discharge status: Home / Self Care | Payer: 59 | Source: Ambulatory Visit | Attending: Obstetrics and Gynecology | Admitting: Obstetrics and Gynecology

## 2016-01-07 DIAGNOSIS — Z853 Personal history of malignant neoplasm of breast: Secondary | ICD-10-CM

## 2016-01-07 DIAGNOSIS — R922 Inconclusive mammogram: Secondary | ICD-10-CM | POA: Diagnosis not present

## 2016-01-13 DIAGNOSIS — M25512 Pain in left shoulder: Secondary | ICD-10-CM | POA: Diagnosis not present

## 2016-01-29 MED FILL — LETROZOLE 2.5 MG TABLET: 2.5 | 90 days supply | Qty: 90 | Fill #3

## 2016-05-05 ENCOUNTER — Other Ambulatory Visit: Payer: Self-pay | Admitting: Oncology

## 2016-05-05 MED FILL — LETROZOLE 2.5 MG TABLET: 2.5 | 90 days supply | Qty: 90 | Fill #0

## 2016-06-08 ENCOUNTER — Ambulatory Visit (HOSPITAL_BASED_OUTPATIENT_CLINIC_OR_DEPARTMENT_OTHER): Payer: 59 | Admitting: Oncology

## 2016-06-08 VITALS — BP 122/75 | HR 67 | Temp 97.9°F | Resp 17 | Ht 66.5 in | Wt 235.3 lb

## 2016-06-08 DIAGNOSIS — C50412 Malignant neoplasm of upper-outer quadrant of left female breast: Secondary | ICD-10-CM

## 2016-06-08 DIAGNOSIS — Z170 Estrogen receptor positive status [ER+]: Secondary | ICD-10-CM | POA: Diagnosis not present

## 2016-06-08 NOTE — Progress Notes (Signed)
  [facility] OFFICE PROGRESS NOTE   Diagnosis: Breast cancer  INTERVAL HISTORY:   Melissa [name] returns as scheduled. She continues Femara. She has hot flashes. She has knee pain, improved with injection therapy at orthopedics. Left arm "bursitis "has also improved. No change over either breast. A bilateral mammogram 01/07/2016 was negative. Good appetite.  Objective:  Vital signs in last 24 hours:  Blood pressure 122/75, pulse 67, temperature 97.9 F (36.6 C), temperature source Oral, resp. rate 17, height 5' 6.5" (1.689 m), weight 235 lb 4.8 oz (106.7 kg), SpO2 99 %.    HEENT: Neck without mass Lymphatics: No cervical or supraclavicular nodes. Pea-sized mobile left axillary node. Resp: Lungs clear bilaterally Cardio: Regular rate and rhythm GI: No hepatomegaly Vascular: No leg edema  Breasts: Bilateral breast without mass. Status post left lumpectomy. No evidence for local tumor recurrence.     Medications: I have reviewed the patient's current medications.  Assessment/Plan: 1.Stage I (T1 N0) left-sided breast cancer diagnosed in January of 1999, ER positive, PR positive, HER-2 negative, status post a left lumpectomy, left axillary lymph node dissection, and left breast radiation. She completed 5 years of adjuvant tamoxifen therapy in January of 2004.  2. Recurrent invasive breast cancer near the left lumpectomy scar-confirmed on a needle core biopsy 01/05/2012, the pathology is consistent with invasive breast cancer, ER positive, PR positive, HER-2 negative. ? Local recurrence versus a new breast primary  -breast MRI 01/17/2012 confirmed an isolated mass in the upper outer left breast  -bone scan on 01/19/2012-negative aside from an area of very subtle uptake in the inferior sternum without a CT correlate  -staging CTs of the chest, abdomen, and pelvis on 01/19/2012-negative for metastatic disease  -Partial mastectomy 02/14/2012 confirmed a 1.5 cm grade 2  invasive carcinoma with associated DCIS and negative surgical margins  -Oncotype recurrence score-24  -Initiation of Femara after an office visit on 03/09/2012  3. Tiny cutaneous nodular lesion overlying the left clavicle when she was here on 03/09/2012  4. Arthralgias-most likely related to degenerative arthritis-followed by orthopedics 5. Pea-sized nodular lesion near the left axillary noted on exam 02/28/2014 and 12/02/2015    Disposition:  Melissa [name] remains in clinical remission from breast cancer. She will continue Femara. She will return for an office visit in 8 months. She will be scheduled for a bone density scan and mammogram in June 2018.  Betsy Coder, MD  06/08/2016  9:45 AM

## 2016-06-28 MED FILL — TRIMETHOPRIM 100 MG TABLET: 100 | 90 days supply | Qty: 180 | Fill #1

## 2016-07-08 DIAGNOSIS — M170 Bilateral primary osteoarthritis of knee: Secondary | ICD-10-CM | POA: Diagnosis not present

## 2016-07-08 DIAGNOSIS — M1712 Unilateral primary osteoarthritis, left knee: Secondary | ICD-10-CM | POA: Diagnosis not present

## 2016-07-08 DIAGNOSIS — M1711 Unilateral primary osteoarthritis, right knee: Secondary | ICD-10-CM | POA: Diagnosis not present

## 2016-08-16 MED FILL — LETROZOLE 2.5 MG TABLET: 2.5 | 90 days supply | Qty: 90 | Fill #1

## 2016-08-27 DIAGNOSIS — M179 Osteoarthritis of knee, unspecified: Secondary | ICD-10-CM | POA: Diagnosis not present

## 2016-08-27 DIAGNOSIS — Z0001 Encounter for general adult medical examination with abnormal findings: Secondary | ICD-10-CM | POA: Diagnosis not present

## 2016-08-27 DIAGNOSIS — C50919 Malignant neoplasm of unspecified site of unspecified female breast: Secondary | ICD-10-CM | POA: Diagnosis not present

## 2016-08-27 DIAGNOSIS — D649 Anemia, unspecified: Secondary | ICD-10-CM | POA: Diagnosis not present

## 2016-08-27 DIAGNOSIS — E559 Vitamin D deficiency, unspecified: Secondary | ICD-10-CM | POA: Diagnosis not present

## 2016-08-27 DIAGNOSIS — E669 Obesity, unspecified: Secondary | ICD-10-CM | POA: Diagnosis not present

## 2016-08-27 DIAGNOSIS — Z79899 Other long term (current) drug therapy: Secondary | ICD-10-CM | POA: Diagnosis not present

## 2016-08-27 DIAGNOSIS — Z8249 Family history of ischemic heart disease and other diseases of the circulatory system: Secondary | ICD-10-CM | POA: Diagnosis not present

## 2016-08-27 DIAGNOSIS — Z6839 Body mass index (BMI) 39.0-39.9, adult: Secondary | ICD-10-CM | POA: Diagnosis not present

## 2016-08-27 DIAGNOSIS — R3129 Other microscopic hematuria: Secondary | ICD-10-CM | POA: Diagnosis not present

## 2016-08-27 DIAGNOSIS — N390 Urinary tract infection, site not specified: Secondary | ICD-10-CM | POA: Diagnosis not present

## 2016-09-07 ENCOUNTER — Ambulatory Visit (INDEPENDENT_AMBULATORY_CARE_PROVIDER_SITE_OTHER): Payer: 59 | Admitting: Cardiovascular Disease

## 2016-09-07 ENCOUNTER — Encounter: Payer: Self-pay | Admitting: Cardiovascular Disease

## 2016-09-07 VITALS — BP 114/76 | HR 71 | Ht 66.0 in | Wt 231.6 lb

## 2016-09-07 DIAGNOSIS — R9431 Abnormal electrocardiogram [ECG] [EKG]: Secondary | ICD-10-CM

## 2016-09-07 DIAGNOSIS — R0602 Shortness of breath: Secondary | ICD-10-CM | POA: Diagnosis not present

## 2016-09-07 NOTE — Progress Notes (Signed)
Cardiology Office Note   Date:  09/08/2016   ID:  Melissa [name], DOB 11-24-1950, MRN [id number]  PCP:  Melissa Screws, MD  Cardiologist:   Skeet Latch, MD   Chief Complaint  Patient presents with  . Shortness of Breath    occasionally.  . Chest Pain    chest fluttering.       History of Present Illness:  Melissa [name] is a 66 y.o. female with recurrent breast cancer status post lumpectomy and Elayne Snare presents for cardiovascular risk assessment.  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 She has several family members who died of cardiovascular disease in her brother recently had passed of a heart attack. Her father had a heart attack in his 57s and several paternal uncles also had heart attacks in their 47s. She denied chest pain but did report exertional dyspnea.  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  Exercise Cardiolite was negative for ischemia.  She achieved 8.5 METs.    Since her last appointment Ms. [name] has been feeling fairly well.  She sometimes notes fluttering in her chest in the morning while getting dressed.  There is no associated shortness of breath, chest pain, lightheadedness or dizziness.  She sometimes feels nauseous when it occurs.  This makes her nervous because these are the same symptoms her brother felt prior to his sudden cardiac death.  Otherwise her main complaint is bilateral knee pain.  She has osteoarthritis that limits her physical activity.  She exercises 5 days per week.  However after exercise her knees hurt so bad that she is mostly sedentary throughout the rest of the day. She denies any chest pain or shortness of breath with exercise. She also denies lower extremity edema, orthopnea, or PND.   Past Medical History:  Diagnosis Date  . Anemia   . Arthritis    Bilateral Knees  . Breast cancer ([facility]) 07/23/97   left, tx w/xrt, Tamoxifen x 5  yrs  . Chronic pain    left knee  . Diastolic dysfunction without heart failure 09/01/2015   Grade 1 diastolic dysfunction on echo 06/2015  . History of bladder infections   . Hx of radiation therapy 08/22/97 - 10/03/97   left breast  . Recurrent breast cancer ([facility]) 01/04/12   biopsy, ER/PR+, Her 2 -  . Shortness of breath 06/26/2015    Past Surgical History:  Procedure Laterality Date  . ABDOMINAL HYSTERECTOMY  1987   endometriosos  . BREAST LUMPECTOMY  02/14/2012   LUMPECTOMY;  Surgeon: Haywood Lasso, MD;  Location: [facility];  Service: General;  Laterality: Left;  . [place]   lumpectomy-left  . CESAREAN SECTION     x 3  . CHOLECYSTECTOMY  1993  . HERNIA REPAIR  [id number]   umbilical   . ovary removed  1987     Current Outpatient Prescriptions  Medication Sig Dispense Refill  . calcium carbonate 1250 MG capsule Take 1,250 mg by mouth daily.    . Cholecalciferol (VITAMIN D3) 2000 UNITS TABS Take 1 tablet by mouth daily.    Marland Kitchen estradiol (ESTRACE) 0.1 MG/GM vaginal cream Place 5 g vaginally once a week. Once symptoms improve, decrease frequency of esterase to once weekly and further decrease based on symptoms    . letrozole (FEMARA) 2.5 MG tablet TAKE 1 TABLET BY MOUTH DAILY. 90 tablet 2  . Naproxen Sodium (  ALEVE) 220 MG CAPS Take 220 mg by mouth as needed.    . trimethoprim (TRIMPEX) 100 MG tablet Take 100 mg by mouth 2 (two) times daily.     No current facility-administered medications for this visit.     Allergies:   Patient has no known allergies.    Social History:  The patient  reports that she has never smoked. She has never used smokeless tobacco. She reports that she does not drink alcohol or use drugs.   Family History:  The patient's family history includes Cancer in her maternal aunt, maternal aunt, maternal grandfather, and paternal aunt; Cancer (age of onset: 59) in her mother; Heart attack in her brother, father, and paternal grandfather; Heart failure in  her sister; Prostate cancer in her maternal uncle.    ROS:  Please see the history of present illness.   Otherwise, review of systems are positive for none.   All other systems are reviewed and negative.    PHYSICAL EXAM: VS:  BP 114/76   Pulse 71   Ht 5\' 6"  (1.676 m)   Wt 105.1 kg (231 lb 9.6 oz)   BMI 37.38 kg/m  , BMI Body mass index is 37.38 kg/m. GENERAL:  Well appearing HEENT:  Pupils equal round and reactive, fundi not visualized, oral mucosa unremarkable NECK:  No jugular venous distention, waveform within normal limits, carotid upstroke brisk and symmetric, no bruits LUNGS:  Clear to auscultation bilaterally HEART:  RRR.  PMI not displaced or sustained,S1 and S2 within normal limits, no S3, no S4, no clicks, no rubs, no murmurs ABD:  Flat, positive bowel sounds normal in frequency in pitch, no bruits, no rebound, no guarding, no midline pulsatile mass, no hepatomegaly, no splenomegaly EXT:  2 plus pulses throughout, no edema, no cyanosis no clubbing SKIN:  No rashes no nodules NEURO:  Cranial nerves II through XII grossly intact, motor grossly intact throughout PSYCH:  Cognitively intact, oriented to person place and time   EKG:  EKG is ordered today. 09/07/16: Sinus rhythm.  Rate 71.  LAFB.  Diffuse TWI anterolateral and inferior TWI  Echo 07/08/15: Study Conclusions  - Left ventricle: The cavity size was normal. Wall thickness was normal. Systolic function was normal. The estimated ejection fraction was in the range of 55% to 60%. Wall motion was normal; there were no regional wall motion abnormalities. Doppler parameters are consistent with abnormal left ventricular relaxation (grade 1 diastolic dysfunction). - Aortic valve: There was no stenosis. There was trivial regurgitation. - Aorta: Mildly dilated aortic root. Aortic root dimension: 38 mm (ED). - Mitral valve: There was mild regurgitation. - Right ventricle: The cavity size was normal.  Systolic function was normal. - Tricuspid valve: Peak RV-RA gradient (S): 29 mm Hg. - Pulmonary arteries: PA peak pressure: 32 mm Hg (S). - Inferior vena cava: The vessel was normal in size. The respirophasic diameter changes were in the normal range (>= 50%), consistent with normal central venous pressure.  Impressions:  - Normal LV size with EF 55-60%. Normal RV size and systolic function. Mild mitral regurgitation and mild tricuspid regurgitation.  Exercise Cardiolite 07/16/15:  The left ventricular ejection fraction is normal (55-65%).  Nuclear stress EF: 57%.  There was no ST segment deviation noted during stress.  This is a low risk study.  Low risk stress nuclear study with a small, mild, fixed apical defect consistent with soft tissue attenuation/apical thinning; no ischemia; EF 57 with normal wall motion.   Recent Labs: No  results found for requested labs within last 8760 hours.    Lipid Panel No results found for: CHOL, TRIG, HDL, CHOLHDL, VLDL, LDLCALC, LDLDIRECT 08/26/14: Chol 181, HDL 62, LDL 98  08/27/16: Cholesterol 179, HDL 65, LDL 100, triglycerides 67 Sodium 142, potassium 4.8, BUN 14, creatinine 0.96 AST 18, ALT 19   Wt Readings from Last 3 Encounters:  09/07/16 105.1 kg (231 lb 9.6 oz)  06/08/16 106.7 kg (235 lb 4.8 oz)  12/02/15 104.8 kg (231 lb)      ASSESSMENT AND PLAN:  # CV Risk assessment: Melissa [name] has several first degree family members with premature coronary artery disease. She remains symptom free.  However her EKG shows diffuse TWI and she has been experiencing similar symptoms to her brother prior to his passing from SCD.  LDL 100 off statins.  We will obtain a cardiac CT-A to better define her coronary anatomy and ensure that her symptoms re not related to ischemia.   # Diastolic dysfunction: Echo revealed grade 1 diastolic dysfunction.  She has no evidence of heart failure on exam and I do not suspect that this is related  to her shortness of breath.  We will continue to monitor over time.   # Obesity: Melissa [name] was congratulated on her excellent exercise routine.  She was encouraged to continue working on her diet and reducing caloric intake.  We discussed the fact that she may ultimately need knee replacement so that she can be more physically active. Better over the  Current medicines are reviewed at length with the patient today.  The patient does not have concerns regarding medicines.  The following changes have been made:  no change  Labs/ tests ordered today include:   Orders Placed This Encounter  Procedures  . CT CORONARY MORPH W/CTA COR W/SCORE W/CA W/CM &/OR WO/CM  . EKG 12-Lead     Disposition:   FU with Palmer Fahrner C. Oval Linsey, MD, [facility] in 1 year.   Signed, Genavieve Mangiapane C. Oval Linsey, MD, [facility]  09/08/2016 8:51 AM    [facility]

## 2016-09-07 NOTE — Patient Instructions (Addendum)
Medication Instructions:  Your physician recommends that you continue on your current medications as directed. Please refer to the Current Medication list given to you today.  Labwork: WILL CALL AND GET YOUR LABS FROM YOUR PCP   Testing/Procedures: CARDIAC CTA   Follow-Up: Your physician wants you to follow-up in: [place] will receive a reminder letter in the mail two months in advance. If you don't receive a letter, please call our office to schedule the follow-up appointment.  If you need a refill on your cardiac medications before your next appointment, please call your pharmacy.   Cardiac CT Angiogram A cardiac CT angiogram is a test to help your health care provider find out why you are having chest pains or other symptoms of heart disease. The test uses an advanced type of X-ray machine that scans your heart and the area around the heart and creates multiple pictures of it. Other names for the test are coronary CT angiography, coronary artery scanning, and CTA.  The test is painless and fairly quick. It is noninvasive. That means it does not involve any type of surgery or cuts (incisions). Instead, a fluid called contrast dye is injected into an IV tube in your arm. The contrast dye acts as a highlighter as it flows through the veins. With the CT scan, it lets your health care provider see:   If the coronary arteries in your heart are more narrow than they should be, or if they are blocked.  If there is fluid around the heart.  If the muscles and tissues of the heart look weak or show signs of disease.  If the lungs contain any blood clots. LET [facility] CARE PROVIDER KNOW ABOUT:  Any allergies you have.   All medicines you are taking, including vitamins, herbs, eye drops, creams, and over-the-counter medicines.  Previous problems you or members of your family have had with the use of anesthetics.  Any blood disorders you have.  Previous surgeries you have  had.  Medical conditions you have. RISKS AND COMPLICATIONS Generally, this is a safe procedure. However, as with any procedure, problems can occur. Possible problems include:   Allergic reaction to the contrast dye. This can range from mild to severe and may include:   Itching at the IV tube insertion site.   Redness at the IV tube insertion site.   Hives.   Nausea.   Difficulty breathing.   Kidney failure.   Problems from radiation exposure. This test involves the use of radiation. Radiation exposure can be dangerous to a pregnant patient and fetus. If you are pregnant, shields are used to protect your belly and pelvic area. More details are available from your health care provider. BEFORE THE PROCEDURE  The day before the test:    Stop drinking caffeinated beverages. These include energy drinks, tea, soda, coffee, and hot chocolate.  Stop taking medicines to treat erectile dysfunction. They can interfere with medicines you may be given during the procedure. Check with your health care provider if you should stop taking any other medicines. On the day of the test:   About 4 hours before the test, stop eating and drinking anything but water as advised by your health care provider.  Avoid wearing jewelry. You will have to undress from the waist up and wear a hospital gown. PROCEDURE  The hair on your chest may need to be shaved. This is done because small sticky patches called electrodes are put on your chest. These  transmit information that helps monitor your heart during the test.  You might be given heart medicine during the test. This is done to control your heart rate during the test so a good image is obtained.  An IV tube will be inserted in your arm.  You will be asked to lie on a table with your arms above your head.  The contrast dye will be injected into the IV tube. You might feel warm or you may get a metallic taste in your mouth.  The table you are  lying on will move into a large machine that will do the scanning.  You will be able to see, hear, and talk to the person running the machine while you are in it. Follow that person's directions. You may be asked to hold your breath for 2-3 seconds as pictures are taken.  The CT machine will move around you to take pictures. Do not move while it is scanning. This helps to get a good image of your heart.  When the best possible pictures have been taken, the machine will be turned off. The table will move out of the machine. The IV tube will then be removed. AFTER THE PROCEDURE  You will be allowed to get dressed and return to your normal activities.  Results will be interpreted by the health care provider and the results will be discussed with you. This information is not intended to replace advice given to you by your health care provider. Make sure you discuss any questions you have with your health care provider. Document Released: 06/24/2008 Document Revised: 08/02/2014 Document Reviewed: 03/28/2013 Elsevier Interactive Patient Education  2017 [employer].

## 2016-09-13 ENCOUNTER — Encounter: Payer: Self-pay | Admitting: Cardiovascular Disease

## 2016-09-23 ENCOUNTER — Ambulatory Visit (HOSPITAL_COMMUNITY)
Admission: RE | Admit: 2016-09-23 | Discharge: 2016-09-23 | Disposition: A | Discharge status: Home / Self Care | Payer: 59 | Source: Ambulatory Visit | Attending: Cardiovascular Disease | Admitting: Cardiovascular Disease

## 2016-09-23 DIAGNOSIS — R9431 Abnormal electrocardiogram [ECG] [EKG]: Secondary | ICD-10-CM | POA: Diagnosis not present

## 2016-09-23 DIAGNOSIS — R079 Chest pain, unspecified: Secondary | ICD-10-CM

## 2016-09-23 DIAGNOSIS — R0602 Shortness of breath: Secondary | ICD-10-CM | POA: Diagnosis not present

## 2016-09-23 MED ORDER — METOPROLOL TARTRATE 5 MG/5ML IV SOLN
INTRAVENOUS | Status: AC
  Filled 2016-09-23: qty 15

## 2016-09-23 MED ORDER — METOPROLOL TARTRATE 5 MG/5ML IV SOLN
5.0000 mg | INTRAVENOUS | Status: DC | PRN
  Filled 2016-09-23: qty 5

## 2016-09-23 MED ORDER — IOPAMIDOL (ISOVUE-370) INJECTION 76%
INTRAVENOUS | Status: AC
  Administered 2016-09-23: 80 mL
  Filled 2016-09-23: qty 100

## 2016-09-23 MED ORDER — NITROGLYCERIN 0.4 MG SL SUBL
0.4000 mg | SUBLINGUAL_TABLET | Freq: Once | SUBLINGUAL | Status: AC
  Administered 2016-09-23: 0.4 mg via SUBLINGUAL
  Filled 2016-09-23: qty 25

## 2016-09-23 MED ORDER — NITROGLYCERIN 0.4 MG SL SUBL
SUBLINGUAL_TABLET | SUBLINGUAL | Status: AC
  Filled 2016-09-23: qty 1

## 2016-09-23 NOTE — Progress Notes (Signed)
CT scan completed. Tolerated well. D/C home walking by herself. Awake and alert. In no distress. 

## 2016-09-24 ENCOUNTER — Telehealth: Payer: Self-pay | Admitting: *Deleted

## 2016-09-24 NOTE — Telephone Encounter (Signed)
-----   Message from Skeet Latch, MD sent at 09/24/2016  4:16 PM EST ----- Discussed with patient.  Start aspirin 81 mg daily.

## 2016-10-11 DIAGNOSIS — M8588 Other specified disorders of bone density and structure, other site: Secondary | ICD-10-CM | POA: Diagnosis not present

## 2016-10-21 ENCOUNTER — Other Ambulatory Visit: Payer: Self-pay | Admitting: Oncology

## 2016-10-21 DIAGNOSIS — Z853 Personal history of malignant neoplasm of breast: Secondary | ICD-10-CM

## 2016-11-18 DIAGNOSIS — M170 Bilateral primary osteoarthritis of knee: Secondary | ICD-10-CM | POA: Diagnosis not present

## 2016-11-23 MED FILL — LETROZOLE 2.5 MG TABLET: 2.5 | 90 days supply | Qty: 90 | Fill #2

## 2016-12-30 DIAGNOSIS — M170 Bilateral primary osteoarthritis of knee: Secondary | ICD-10-CM | POA: Diagnosis not present

## 2017-01-06 DIAGNOSIS — N952 Postmenopausal atrophic vaginitis: Secondary | ICD-10-CM | POA: Diagnosis not present

## 2017-01-06 DIAGNOSIS — C50919 Malignant neoplasm of unspecified site of unspecified female breast: Secondary | ICD-10-CM | POA: Diagnosis not present

## 2017-01-06 DIAGNOSIS — Z01419 Encounter for gynecological examination (general) (routine) without abnormal findings: Secondary | ICD-10-CM | POA: Diagnosis not present

## 2017-01-06 DIAGNOSIS — Z6838 Body mass index (BMI) 38.0-38.9, adult: Secondary | ICD-10-CM | POA: Diagnosis not present

## 2017-01-06 MED FILL — ESTRADIOL 0.1 MG/GM CRM: 0.1 | 7 days supply | Qty: 43 | Fill #0

## 2017-01-10 ENCOUNTER — Ambulatory Visit
Admission: RE | Admit: 2017-01-10 | Discharge: 2017-01-10 | Disposition: A | Discharge status: Home / Self Care | Payer: 59 | Source: Ambulatory Visit | Attending: Oncology | Admitting: Oncology

## 2017-01-10 DIAGNOSIS — R922 Inconclusive mammogram: Secondary | ICD-10-CM | POA: Diagnosis not present

## 2017-01-10 DIAGNOSIS — Z853 Personal history of malignant neoplasm of breast: Secondary | ICD-10-CM

## 2017-01-10 HISTORY — DX: Personal history of irradiation: Z92.3

## 2017-01-24 MED FILL — TRIMETHOPRIM 100 MG TABLET: 100 | 90 days supply | Qty: 180 | Fill #0

## 2017-02-14 ENCOUNTER — Ambulatory Visit: Payer: 59 | Admitting: Oncology

## 2017-03-03 ENCOUNTER — Other Ambulatory Visit: Payer: Self-pay | Admitting: *Deleted

## 2017-03-03 MED ORDER — LETROZOLE 2.5 MG PO TABS: 2.5000 mg | ORAL_TABLET | Freq: Every day | ORAL | 0 refills | Status: DC

## 2017-03-04 MED FILL — LETROZOLE 2.5 MG TABLET: 2.5 | 90 days supply | Qty: 90 | Fill #0

## 2017-03-07 ENCOUNTER — Telehealth: Payer: Self-pay | Admitting: Oncology

## 2017-03-07 NOTE — Telephone Encounter (Signed)
Left patient voicemail regarding her upcoming appointment in September.   Sending her a confirmation letter in the mail as well.

## 2017-03-17 DIAGNOSIS — M170 Bilateral primary osteoarthritis of knee: Secondary | ICD-10-CM | POA: Diagnosis not present

## 2017-03-17 DIAGNOSIS — M1711 Unilateral primary osteoarthritis, right knee: Secondary | ICD-10-CM | POA: Diagnosis not present

## 2017-04-07 ENCOUNTER — Ambulatory Visit (HOSPITAL_BASED_OUTPATIENT_CLINIC_OR_DEPARTMENT_OTHER): Payer: 59 | Admitting: Oncology

## 2017-04-07 VITALS — BP 119/74 | HR 60 | Temp 98.4°F | Resp 20 | Ht 66.0 in | Wt 229.8 lb

## 2017-04-07 DIAGNOSIS — Z853 Personal history of malignant neoplasm of breast: Secondary | ICD-10-CM | POA: Diagnosis not present

## 2017-04-07 DIAGNOSIS — Z79811 Long term (current) use of aromatase inhibitors: Secondary | ICD-10-CM | POA: Diagnosis not present

## 2017-04-07 DIAGNOSIS — Z170 Estrogen receptor positive status [ER+]: Secondary | ICD-10-CM

## 2017-04-07 DIAGNOSIS — N951 Menopausal and female climacteric states: Secondary | ICD-10-CM

## 2017-04-07 DIAGNOSIS — C50412 Malignant neoplasm of upper-outer quadrant of left female breast: Secondary | ICD-10-CM | POA: Diagnosis not present

## 2017-04-07 NOTE — Progress Notes (Signed)
  [facility] OFFICE PROGRESS NOTE   Diagnosis:  Breast cancer  INTERVAL HISTORY:   Melissa [name] returns as scheduled. She continues Femara. She has intermittent hot flashes. She continues to have arthralgias and stiffness in the knees. She recently received a steroid injection to the right knee. She does not have pain in other joints. No change at either breast. Good appetite. A bilateral mammogram 01/10/2017 was negative.  Objective:  Vital signs in last 24 hours:  Blood pressure 119/74, pulse 60, temperature 98.4 F (36.9 C), temperature source Oral, resp. rate 20, height '5\' 6"'$  (1.676 m), weight 229 lb 12.8 oz (104.2 kg), SpO2 100 %.    HEENT: Neck without mass Lymphatics: No cervical, supraclavicular, or right axillary nodes. Pea-sized mobile left axillary node near the medial aspect of the axillary scar Resp: Lungs clear bilaterally Cardio: Regular rate and rhythm GI: No hepatosplenomegaly Vascular: No leg edema Breasts: Bilateral breast without mass. No evidence of local tumor recurrence at the left lumpectomy site.   Medications: I have reviewed the patient's current medications.  Assessment/Plan: 1.Stage I (T1 N0) left-sided breast cancer diagnosed in January of 1999, ER positive, PR positive, HER-2 negative, status post a left lumpectomy, left axillary lymph node dissection, and left breast radiation. She completed 5 years of adjuvant tamoxifen therapy in January of 2004.  2. Recurrent invasive breast cancer near the left lumpectomy scar-confirmed on a needle core biopsy 01/05/2012, the pathology is consistent with invasive breast cancer, ER positive, PR positive, HER-2 negative. ? Local recurrence versus a new breast primary  -breast MRI 01/17/2012 confirmed an isolated mass in the upper outer left breast  -bone scan on 01/19/2012-negative aside from an area of very subtle uptake in the inferior sternum without a CT correlate  -staging CTs of the chest,  abdomen, and pelvis on 01/19/2012-negative for metastatic disease  -Partial mastectomy 02/14/2012 confirmed a 1.5 cm grade 2 invasive carcinoma with associated DCIS and negative surgical margins  -Oncotype recurrence score-24  -Initiation of Femara after an office visit on 03/09/2012  3. Tiny cutaneous nodular lesion overlying the left clavicle when she was here on 03/09/2012  4. Knee Arthralgias-most likely related to degenerative arthritis-followed by orthopedics 5. Pea-sized nodular lesion near the left axillary scar -stable    Disposition:  Ms. Petron remains in clinical remission from breast cancer. She will continue Femara. She does not wish to begin a trial of medical therapy for hot flashes. She continues follow-up with orthopedics for treatment of knee arthritis.  She will continue yearly mammography. Her bone density is followed by Dr. Inda Merlin.  Ms. Tupou will return for an office visit in one year.  Donneta Romberg, MD  04/07/2017  12:25 PM

## 2017-04-08 ENCOUNTER — Telehealth: Payer: Self-pay | Admitting: Oncology

## 2017-04-08 NOTE — Telephone Encounter (Signed)
Scheduled appt per 9/13 los - f/u in one year - reminder letter sent in the mail.

## 2017-07-06 MED FILL — ATOVAQUONE-PROGUANIL 250-10: 250-100 | 15 days supply | Qty: 15 | Fill #0

## 2017-07-06 MED FILL — AZITHROMYCIN 250 MG TAB: 250 | 3 days supply | Qty: 6 | Fill #0

## 2017-07-07 ENCOUNTER — Other Ambulatory Visit: Payer: Self-pay | Admitting: Oncology

## 2017-07-07 DIAGNOSIS — M25562 Pain in left knee: Secondary | ICD-10-CM | POA: Diagnosis not present

## 2017-07-07 DIAGNOSIS — M25561 Pain in right knee: Secondary | ICD-10-CM | POA: Diagnosis not present

## 2017-07-07 DIAGNOSIS — M170 Bilateral primary osteoarthritis of knee: Secondary | ICD-10-CM | POA: Diagnosis not present

## 2017-07-07 DIAGNOSIS — G8929 Other chronic pain: Secondary | ICD-10-CM | POA: Diagnosis not present

## 2017-07-11 MED FILL — LETROZOLE 2.5 MG TAB: 2.5 | 90 days supply | Qty: 90 | Fill #0

## 2017-08-31 MED FILL — TRIMETHOPRIM 100 MG TABLET: 100 | 90 days supply | Qty: 180 | Fill #0

## 2017-09-01 DIAGNOSIS — C50912 Malignant neoplasm of unspecified site of left female breast: Secondary | ICD-10-CM | POA: Diagnosis not present

## 2017-09-05 DIAGNOSIS — Z79899 Other long term (current) drug therapy: Secondary | ICD-10-CM | POA: Diagnosis not present

## 2017-09-05 DIAGNOSIS — L678 Other hair color and hair shaft abnormalities: Secondary | ICD-10-CM | POA: Diagnosis not present

## 2017-09-05 DIAGNOSIS — E669 Obesity, unspecified: Secondary | ICD-10-CM | POA: Diagnosis not present

## 2017-09-05 DIAGNOSIS — D649 Anemia, unspecified: Secondary | ICD-10-CM | POA: Diagnosis not present

## 2017-09-05 DIAGNOSIS — E559 Vitamin D deficiency, unspecified: Secondary | ICD-10-CM | POA: Diagnosis not present

## 2017-09-05 DIAGNOSIS — Z13220 Encounter for screening for lipoid disorders: Secondary | ICD-10-CM | POA: Diagnosis not present

## 2017-09-05 DIAGNOSIS — N390 Urinary tract infection, site not specified: Secondary | ICD-10-CM | POA: Diagnosis not present

## 2017-09-05 DIAGNOSIS — Z23 Encounter for immunization: Secondary | ICD-10-CM | POA: Diagnosis not present

## 2017-09-05 DIAGNOSIS — Z853 Personal history of malignant neoplasm of breast: Secondary | ICD-10-CM | POA: Diagnosis not present

## 2017-09-05 DIAGNOSIS — M179 Osteoarthritis of knee, unspecified: Secondary | ICD-10-CM | POA: Diagnosis not present

## 2017-09-05 DIAGNOSIS — Z0000 Encounter for general adult medical examination without abnormal findings: Secondary | ICD-10-CM | POA: Diagnosis not present

## 2017-10-05 ENCOUNTER — Encounter: Payer: Self-pay | Admitting: Cardiovascular Disease

## 2017-10-05 ENCOUNTER — Ambulatory Visit: Payer: 59 | Admitting: Cardiovascular Disease

## 2017-10-05 VITALS — BP 113/76 | HR 67 | Ht 66.0 in | Wt 223.8 lb

## 2017-10-05 DIAGNOSIS — I2510 Atherosclerotic heart disease of native coronary artery without angina pectoris: Secondary | ICD-10-CM | POA: Diagnosis not present

## 2017-10-05 DIAGNOSIS — R9431 Abnormal electrocardiogram [ECG] [EKG]: Secondary | ICD-10-CM | POA: Diagnosis not present

## 2017-10-05 DIAGNOSIS — E785 Hyperlipidemia, unspecified: Secondary | ICD-10-CM | POA: Diagnosis not present

## 2017-10-05 DIAGNOSIS — R0602 Shortness of breath: Secondary | ICD-10-CM

## 2017-10-05 DIAGNOSIS — I5032 Chronic diastolic (congestive) heart failure: Secondary | ICD-10-CM | POA: Diagnosis not present

## 2017-10-05 NOTE — Progress Notes (Signed)
Cardiology Office Note   Date:  10/05/2017   ID:  Melissa [name], DOB Aug 21, 1950, MRN [id number]  PCP:  Josetta Huddle, MD  Cardiologist:   Skeet Latch, MD   Chief Complaint  Patient presents with  . Follow-up      History of Present Illness:  JAKERIA CAISSIE is a 67 y.o. female with recurrent breast cancer status post lumpectomy and Elayne Snare presents for follow up.   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 She has several family members who died of cardiovascular disease in her brother recently had passed of a heart attack. Her father had a heart attack in his 42s and several paternal uncles also had heart attacks in their 14s. She denied chest pain but did report exertional dyspnea.  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  Exercise Cardiolite was negative for ischemia.  She achieved 8.5 METs.    Since her last appointment 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  She had non-obstructive proximal LAD plaque.  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  She has no chest pain or shortness of breath.  She continues to exercise 5 days/week.  However, her exercise is limited by knee pain.  She does water aerobics on Mondays and Wednesdays and goes to the gym Monday through Friday.  She has no chest pain.  She does get some shortness of breath when going upstairs that has been stable.  She has no lower extremity edema, orthopnea, or PND.  She has noted an intermittent fluttering in her upper chest.  She is unsure if it is coming from her heart.  She tries to check her heart rate when it occurs and thinks that it has been within normal limits.  Episodes occur very sporadically and sometimes occur after eating.  Rarely it occurs when walking.  There is no associated shortness of breath,  lightheadedness, or dizziness that last for a few moments before subsiding.  She has been working on her diet and limiting starches.  She has fried food, fatty foods, and red meat very rarely.    Past Medical History:  Diagnosis Date  . Anemia   . Arthritis    Bilateral Knees  . Breast cancer ([facility]) 07/23/97   left, tx w/xrt, Tamoxifen x 5 yrs  . Chronic pain    left knee  . Diastolic dysfunction without heart failure 09/01/2015   Grade 1 diastolic dysfunction on echo 06/2015  . History of bladder infections   . Hx of radiation therapy 08/22/97 - 10/03/97   left breast  . Personal history of radiation therapy   . Recurrent breast cancer ([facility]) 01/04/12   biopsy, ER/PR+, Her 2 -  . Shortness of breath 06/26/2015    Past Surgical History:  Procedure Laterality Date  . ABDOMINAL HYSTERECTOMY  1987   endometriosos  . BREAST LUMPECTOMY  02/14/2012   LUMPECTOMY;  Surgeon: Haywood Lasso, MD;  Location: [facility];  Service: General;  Laterality: Left;  . [place]   lumpectomy-left  . CESAREAN SECTION     x 3  . CHOLECYSTECTOMY  1993  . HERNIA REPAIR  [id number]   umbilical   . ovary removed  1987     Current Outpatient Medications  Medication Sig Dispense Refill  .  aspirin EC 81 MG tablet Take 81 mg by mouth daily.    . Cholecalciferol (VITAMIN D3) 2000 UNITS TABS Take 1 tablet by mouth daily.    Marland Kitchen estradiol (ESTRACE) 0.1 MG/GM vaginal cream Place 5 g vaginally once a week. Once symptoms improve, decrease frequency of esterase to once weekly and further decrease based on symptoms    . letrozole (FEMARA) 2.5 MG tablet TAKE 1 TABLET BY MOUTH DAILY. 90 tablet 3  . Naproxen Sodium (ALEVE) 220 MG CAPS Take 220 mg by mouth as needed.    . trimethoprim (TRIMPEX) 100 MG tablet Take 100 mg by mouth 2 (two) times daily.     No current facility-administered medications for this visit.     Allergies:   Patient has no known allergies.    Social History:  The patient  reports that   has never smoked. she has never used smokeless tobacco. She reports that she does not drink alcohol or use drugs.   Family History:  The patient's family history includes Breast cancer in her maternal aunt; Cancer in her maternal aunt, maternal aunt, maternal grandfather, and paternal aunt; Cancer (age of onset: 44) in her mother; Heart attack in her brother, father, and paternal grandfather; Heart failure in her sister; Prostate cancer in her maternal uncle.    ROS:  Please see the history of present illness.   Otherwise, review of systems are positive for none.   All other systems are reviewed and negative.    PHYSICAL EXAM: VS:  BP 113/76   Pulse 67   Ht 5\' 6"  (1.676 m)   Wt 223 lb 12.8 oz (101.5 kg)   BMI 36.12 kg/m  , BMI Body mass index is 36.12 kg/m. GENERAL:  Well appearing HEENT: Pupils equal round and reactive, fundi not visualized, oral mucosa unremarkable NECK:  No jugular venous distention, waveform within normal limits, carotid upstroke brisk and symmetric, no bruits LUNGS:  Clear to auscultation bilaterally HEART:  RRR.  PMI not displaced or sustained,S1 and S2 within normal limits, no S3, no S4, no clicks, no rubs, no murmurs ABD:  Flat, positive bowel sounds normal in frequency in pitch, no bruits, no rebound, no guarding, no midline pulsatile mass, no hepatomegaly, no splenomegaly EXT:  2 plus pulses throughout, no edema, no cyanosis no clubbing SKIN:  No rashes no nodules NEURO:  Cranial nerves II through XII grossly intact, motor grossly intact throughout PSYCH:  Cognitively intact, oriented to person place and time    EKG:  EKG is ordered today. 09/07/16: Sinus rhythm.  Rate 71.  LAFB.  Diffuse TWI anterolateral and inferior TWI 10/05/17: Sinus rhythm.  Rate 62 bpm.  Anterolateral and inferior T wave inversions.   Echo 07/08/15: Study Conclusions  - Left ventricle: The cavity size was normal. Wall thickness was normal. Systolic function was normal. The  estimated ejection fraction was in the range of 55% to 60%. Wall motion was normal; there were no regional wall motion abnormalities. Doppler parameters are consistent with abnormal left ventricular relaxation (grade 1 diastolic dysfunction). - Aortic valve: There was no stenosis. There was trivial regurgitation. - Aorta: Mildly dilated aortic root. Aortic root dimension: 38 mm (ED). - Mitral valve: There was mild regurgitation. - Right ventricle: The cavity size was normal. Systolic function was normal. - Tricuspid valve: Peak RV-RA gradient (S): 29 mm Hg. - Pulmonary arteries: PA peak pressure: 32 mm Hg (S). - Inferior vena cava: The vessel was normal in size. The respirophasic diameter changes  were in the normal range (>= 50%), consistent with normal central venous pressure.  Impressions:  - Normal LV size with EF 55-60%. Normal RV size and systolic function. Mild mitral regurgitation and mild tricuspid regurgitation.  Exercise Cardiolite 07/16/15:  The left ventricular ejection fraction is normal (55-65%).  Nuclear stress EF: 57%.  There was no ST segment deviation noted during stress.  This is a low risk study.  Low risk stress nuclear study with a small, mild, fixed apical defect consistent with soft tissue attenuation/apical thinning; no ischemia; EF 57 with normal wall motion.   Recent Labs: No results found for requested labs within last 8760 hours.    Lipid Panel No results found for: CHOL, TRIG, HDL, CHOLHDL, VLDL, LDLCALC, LDLDIRECT 08/26/14: Chol 181, HDL 62, LDL 98  08/27/16: Cholesterol 179, HDL 65, LDL 100, triglycerides 67 Sodium 142, potassium 4.8, BUN 14, creatinine 0.96 AST 18, ALT 19  09/05/17:  Sodium 141, potassium 4.5, BUN 19, creatinine 0.93 AST 20, ALT 18 Total cholesterol 190, triglycerides 57, HDL 60, LDL 119 TSH 1.19.  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  We discussed the fact that she had some mild blockages in her proximal LAD.  Ideally her LDL should be less than 70.  She would like to work on diet and exercise prior to starting a statin.  Given that her diet is already pretty good she exercises regularly it is not clear that she will be able to get to goal with these interventions.  Repeat lipids in 3-4 months.  # Diastolic dysfunction: Echo revealed grade 1 diastolic dysfunction.  She has no evidence of heart failure on exam.  # Fluttering: Unclear if this is coming from her heart. Electrolytes and thyroid were within normal limits 08/2017.  It occurs sporadically and less than once per month.  She will call if it occurs more  consistently and we will get an ambulatory monitor.  # Obesity: Mrs. Cordell continues to work hard with diet and exercise.  She is already working hard with diet and exercise.    # Knee OA: She is at low risk for surgery should she decide to have this in September.    Current medicines are reviewed at length with the patient today.  The patient does not have concerns regarding medicines.  The following changes have been made:  no change  Labs/ tests ordered today include:   Orders Placed This Encounter  Procedures  . Lipid panel  . Comprehensive metabolic panel  . EKG 12-Lead     Disposition:   FU with Karinne Schmader C. Oval Linsey, MD, [facility] in 1 year.   Signed, Laporsche Hoeger C. Oval Linsey, MD, [facility]  10/05/2017 9:39 AM    [place]

## 2017-10-05 NOTE — Patient Instructions (Signed)
Medication Instructions:  Your physician recommends that you continue on your current medications as directed. Please refer to the Current Medication list given to you today.  Labwork: FASTING LP/CMET IN 3 MONTHS   Testing/Procedures: NONE  Follow-Up: Your physician wants you to follow-up in: 1 [place] will receive a reminder letter in the mail two months in advance. If you don't receive a letter, please call our office to schedule the follow-up appointment.  If you need a refill on your cardiac medications before your next appointment, please call your pharmacy.

## 2017-10-24 MED FILL — LETROZOLE 2.5 MG TABLET: 2.5 | 90 days supply | Qty: 90 | Fill #1

## 2017-10-25 DIAGNOSIS — M170 Bilateral primary osteoarthritis of knee: Secondary | ICD-10-CM | POA: Diagnosis not present

## 2017-12-05 ENCOUNTER — Other Ambulatory Visit: Payer: Self-pay | Admitting: Oncology

## 2017-12-05 DIAGNOSIS — Z1231 Encounter for screening mammogram for malignant neoplasm of breast: Secondary | ICD-10-CM

## 2017-12-08 DIAGNOSIS — M170 Bilateral primary osteoarthritis of knee: Secondary | ICD-10-CM | POA: Diagnosis not present

## 2017-12-15 DIAGNOSIS — M1712 Unilateral primary osteoarthritis, left knee: Secondary | ICD-10-CM | POA: Diagnosis not present

## 2017-12-15 DIAGNOSIS — M1711 Unilateral primary osteoarthritis, right knee: Secondary | ICD-10-CM | POA: Diagnosis not present

## 2017-12-22 DIAGNOSIS — M170 Bilateral primary osteoarthritis of knee: Secondary | ICD-10-CM | POA: Diagnosis not present

## 2018-01-11 DIAGNOSIS — Z01419 Encounter for gynecological examination (general) (routine) without abnormal findings: Secondary | ICD-10-CM | POA: Diagnosis not present

## 2018-01-11 DIAGNOSIS — C50919 Malignant neoplasm of unspecified site of unspecified female breast: Secondary | ICD-10-CM | POA: Diagnosis not present

## 2018-01-11 DIAGNOSIS — Z6837 Body mass index (BMI) 37.0-37.9, adult: Secondary | ICD-10-CM | POA: Diagnosis not present

## 2018-01-11 DIAGNOSIS — N952 Postmenopausal atrophic vaginitis: Secondary | ICD-10-CM | POA: Diagnosis not present

## 2018-01-12 ENCOUNTER — Ambulatory Visit
Admission: RE | Admit: 2018-01-12 | Discharge: 2018-01-12 | Disposition: A | Discharge status: Home / Self Care | Payer: 59 | Source: Ambulatory Visit | Attending: Oncology | Admitting: Oncology

## 2018-01-12 DIAGNOSIS — Z1231 Encounter for screening mammogram for malignant neoplasm of breast: Secondary | ICD-10-CM

## 2018-01-12 MED FILL — ESTRADIOL 0.1 MG/GM CREA: 0.1 | 90 days supply | Qty: 43 | Fill #0

## 2018-02-02 DIAGNOSIS — M1712 Unilateral primary osteoarthritis, left knee: Secondary | ICD-10-CM | POA: Diagnosis not present

## 2018-02-02 DIAGNOSIS — M170 Bilateral primary osteoarthritis of knee: Secondary | ICD-10-CM | POA: Diagnosis not present

## 2018-02-20 MED FILL — LETROZOLE 2.5 MG TABLET: 2.5 | 90 days supply | Qty: 90 | Fill #2

## 2018-04-04 ENCOUNTER — Ambulatory Visit
Admission: RE | Admit: 2018-04-04 | Discharge: 2018-04-04 | Disposition: A | Discharge status: Home / Self Care | Payer: 59 | Source: Ambulatory Visit | Attending: Internal Medicine | Admitting: Internal Medicine

## 2018-04-04 ENCOUNTER — Other Ambulatory Visit: Payer: Self-pay | Admitting: Internal Medicine

## 2018-04-04 DIAGNOSIS — R109 Unspecified abdominal pain: Secondary | ICD-10-CM

## 2018-04-04 DIAGNOSIS — M5136 Other intervertebral disc degeneration, lumbar region: Secondary | ICD-10-CM | POA: Diagnosis not present

## 2018-04-04 DIAGNOSIS — M549 Dorsalgia, unspecified: Secondary | ICD-10-CM

## 2018-04-04 DIAGNOSIS — K5900 Constipation, unspecified: Secondary | ICD-10-CM | POA: Diagnosis not present

## 2018-04-05 ENCOUNTER — Other Ambulatory Visit: Payer: Self-pay | Admitting: Internal Medicine

## 2018-04-05 DIAGNOSIS — M5136 Other intervertebral disc degeneration, lumbar region: Secondary | ICD-10-CM

## 2018-04-05 DIAGNOSIS — R3129 Other microscopic hematuria: Secondary | ICD-10-CM

## 2018-04-05 DIAGNOSIS — M51369 Other intervertebral disc degeneration, lumbar region without mention of lumbar back pain or lower extremity pain: Secondary | ICD-10-CM

## 2018-04-05 HISTORY — DX: Other intervertebral disc degeneration, lumbar region without mention of lumbar back pain or lower extremity pain: M51.369

## 2018-04-05 HISTORY — DX: Other intervertebral disc degeneration, lumbar region: M51.36

## 2018-04-07 ENCOUNTER — Ambulatory Visit
Admission: RE | Admit: 2018-04-07 | Discharge: 2018-04-07 | Disposition: A | Discharge status: Home / Self Care | Payer: 59 | Source: Ambulatory Visit | Attending: Internal Medicine | Admitting: Internal Medicine

## 2018-04-07 ENCOUNTER — Telehealth: Payer: Self-pay | Admitting: Oncology

## 2018-04-07 ENCOUNTER — Inpatient Hospital Stay: Payer: 59 | Attending: Oncology | Admitting: Oncology

## 2018-04-07 VITALS — BP 107/68 | HR 65 | Temp 98.4°F | Resp 18 | Ht 66.0 in | Wt 224.9 lb

## 2018-04-07 DIAGNOSIS — R3129 Other microscopic hematuria: Secondary | ICD-10-CM

## 2018-04-07 DIAGNOSIS — G8929 Other chronic pain: Secondary | ICD-10-CM | POA: Insufficient documentation

## 2018-04-07 DIAGNOSIS — Z170 Estrogen receptor positive status [ER+]: Secondary | ICD-10-CM | POA: Diagnosis not present

## 2018-04-07 DIAGNOSIS — M25562 Pain in left knee: Secondary | ICD-10-CM | POA: Insufficient documentation

## 2018-04-07 DIAGNOSIS — Z9012 Acquired absence of left breast and nipple: Secondary | ICD-10-CM | POA: Insufficient documentation

## 2018-04-07 DIAGNOSIS — C50412 Malignant neoplasm of upper-outer quadrant of left female breast: Secondary | ICD-10-CM | POA: Diagnosis not present

## 2018-04-07 DIAGNOSIS — R61 Generalized hyperhidrosis: Secondary | ICD-10-CM | POA: Diagnosis not present

## 2018-04-07 NOTE — Telephone Encounter (Signed)
Scheduled appt pe r9/13 los - sent reminder letter in the mail . F/u in one year

## 2018-04-07 NOTE — Progress Notes (Signed)
[facility] OFFICE PROGRESS NOTE   Diagnosis: Breast cancer  INTERVAL HISTORY:   Melissa [name] returns as scheduled.  She continues Femara.  She has noted improvement in hot flashes but continues to have intermittent "sweats ".  Good appetite.  No change over either breast. She has chronic pain in the left knee and is planning to have knee replacement surgery in December.  She developed low back pain beginning 04/02/2018 after getting off of a plane. She underwent a plain x-ray of the lumbar spine on 04/05/2018.  This revealed facet joint change from L3-L5.  Mild degenerative disc disease was noted at L4-L5.  No compression fracture. He takes naproxen twice daily for the knee pain.  Bilateral mammogram 01/12/2018 was negative. Objective:  Vital signs in last 24 hours:  Blood pressure 107/68, pulse 65, temperature 98.4 F (36.9 C), temperature source Oral, resp. rate 18, height '5\' 6"'$  (1.676 m), weight 224 lb 14.4 oz (102 kg), SpO2 99 %.    HEENT: Neck without mass Lymphatics: No cervical, supraclavicular, or axillary nodes, soft pea-sized mobile tissue superior to the medial aspect of the left axillary scar Resp: Clear bilaterally Cardio: Ocular rate and rhythm GI: No hepatomegaly, nontender Vascular: Leg edema Breast: No mass in either breast.    Lab Results:  Lab Results  Component Value Date   WBC 5.7 02/09/2012   HGB 12.8 02/09/2012   HCT 38.0 02/09/2012   MCV 83.7 02/09/2012   PLT 215 02/09/2012   NEUTROABS 2.0 01/19/2012    CMP  Lab Results  Component Value Date   NA 140 02/09/2012   K 4.7 02/09/2012   CL 104 02/09/2012   CO2 28 02/09/2012   GLUCOSE 89 02/09/2012   BUN 18 02/09/2012   CREATININE 1.05 02/09/2012   CALCIUM 9.5 02/09/2012   PROT 7.4 01/19/2012   ALBUMIN 3.5 01/19/2012   AST 30 01/19/2012   ALT 22 01/19/2012   ALKPHOS 79 01/19/2012   BILITOT 0.70 01/19/2012   GFRNONAA 57 (L) 02/09/2012   GFRAA 66 (L) 02/09/2012    No results  found for: CEA1  No results found for: INR  Imaging:  Dg Lumbar Spine 2-3 Views  Result Date: 04/05/2018 CLINICAL DATA:  Sudden onset of low back pain for the past 3 days. Pain radiates into the abdomen. No history of injury EXAM: LUMBAR SPINE - 2-3 VIEW COMPARISON:  Coronal and sagittal reconstructed images through the lumbar spine from an abdominal and pelvic CT scan of January 19, 2012. FINDINGS: The lumbar vertebral bodies are preserved in height. There is mild curvature convex toward the right which is in part positional the pedicles and transverse processes are intact. There is moderate disc space narrowing at L4-5. There is facet joint hypertrophy from L3 through L5. There is no spondylolisthesis. The observed portions of the sacrum are normal. IMPRESSION: There is moderate facet joint change from L3 through L5. There is mild degenerative disc disease at L4-5. No compression fracture or spondylolisthesis. Electronically Signed   By: David  Martinique M.D.   On: 04/05/2018 09:05   Dg Abd 2 Views  Result Date: 04/05/2018 CLINICAL DATA:  Sudden onset of low back pain with pain radiating to the abdomen. No history of injury. EXAM: ABDOMEN - 2 VIEW COMPARISON:  Lumbar spine series of today's date FINDINGS: The colonic stool burden is mildly increased. There is no small or large bowel obstructive pattern. No abnormal soft tissue calcifications are observed. There are pelvic phleboliths. IMPRESSION: Moderately increased  colonic stool burden may reflect constipation in the appropriate clinical setting. No acute intra-abdominal abnormality is observed. Electronically Signed   By: David  Martinique M.D.   On: 04/05/2018 09:08    Medications: I have reviewed the patient's current medications.   Assessment/Plan: 1.Stage I (T1 N0) left-sided breast cancer diagnosed in January of 1999, ER positive, PR positive, HER-2 negative, status post a left lumpectomy, left axillary lymph node dissection, and left breast  radiation. She completed 5 years of adjuvant tamoxifen therapy in January of 2004.  2. Recurrent invasive breast cancer near the left lumpectomy scar-confirmed on a needle core biopsy 01/05/2012, the pathology is consistent with invasive breast cancer, ER positive, PR positive, HER-2 negative. ? Local recurrence versus a new breast primary  -breast MRI 01/17/2012 confirmed an isolated mass in the upper outer left breast  -bone scan on 01/19/2012-negative aside from an area of very subtle uptake in the inferior sternum without a CT correlate  -staging CTs of the chest, abdomen, and pelvis on 01/19/2012-negative for metastatic disease  -Partial mastectomy 02/14/2012 confirmed a 1.5 cm grade 2 invasive carcinoma with associated DCIS and negative surgical margins  -Oncotype recurrence score-24  -Initiation of Femara after an office visit on 03/09/2012  3. Tiny cutaneous nodular lesion overlying the left clavicle when she was here on 03/09/2012  4. Knee Arthralgias-most likely related to degenerative arthritis-followed by orthopedics 5. Pea-sized nodular lesion near the left axillary scar -stable     Disposition: [name] remains in clinical remission from breast cancer.  She will continue letrozole.  I explained that letrozole can cause arthralgias, but should not cause degenerative arthritis.  She plans to have a left knee replacement in December.  She will follow-up with Dr. Inda Merlin for monitoring of the bone density.  She will schedule a yearly mammogram. Ms. Lauter will return for an office visit in 1 year.    Betsy Coder, MD  04/07/2018  11:42 AM

## 2018-04-20 ENCOUNTER — Telehealth: Payer: Self-pay | Admitting: *Deleted

## 2018-04-20 NOTE — Telephone Encounter (Signed)
   [facility] Pre-operative Risk Assessment    Request for surgical clearance:  1. What type of surgery is being performed? LEFT TOTAL KNEE   2. When is this surgery scheduled? 07-10-18   3. What type of clearance is required (medical clearance vs. Pharmacy clearance to hold med vs. Both)? MEDICAL   4. Are there any medications that need to be held prior to surgery and how long?    5. Practice name and name of physician performing surgery? [place] [facility], DR ALUISIO    6. What is your office phone number?[phone]    7.   What is your office fax number?[phone]   8.   Anesthesia type (None, local, MAC, general) ? CHOICE

## 2018-04-20 NOTE — Telephone Encounter (Signed)
Dr. Oval Linsey - if Melissa [name] does not have any new issues, I have a call into her, does she need to be seen prior to her Lt total knee in Dec?  I saw you had cleared on last visit in 09/2017.  Thanks.

## 2018-04-21 MED FILL — TRIMETHOPRIM 100 MG TABLET: 100 | 90 days supply | Qty: 180 | Fill #0

## 2018-04-21 NOTE — Telephone Encounter (Signed)
I have left message for pt.

## 2018-04-21 NOTE — Telephone Encounter (Signed)
No need to be seen.  She is low risk for surgery.

## 2018-04-24 NOTE — Telephone Encounter (Signed)
   Primary Cardiologist: Skeet Latch, MD  Chart reviewed as part of pre-operative protocol coverage. He exercise 5 days/week for 1 hour each time without any angina. Given past medical history and time since last visit, based on ACC/AHA guidelines, KHLOEI SPIKER would be at acceptable risk for the planned procedure without further cardiovascular testing.   I will route this recommendation to the requesting party via Epic fax function and remove from pre-op pool.  Please call with questions.  [place], [place] 04/24/2018, 3:19 PM

## 2018-05-08 DIAGNOSIS — Z23 Encounter for immunization: Secondary | ICD-10-CM | POA: Diagnosis not present

## 2018-05-09 DIAGNOSIS — E785 Hyperlipidemia, unspecified: Secondary | ICD-10-CM | POA: Diagnosis not present

## 2018-05-10 LAB — COMPREHENSIVE METABOLIC PANEL
ALT: 15 IU/L (ref 0–32)
AST: 18 IU/L (ref 0–40)
Albumin/Globulin Ratio: 1.7 (ref 1.2–2.2)
Albumin: 4 g/dL (ref 3.6–4.8)
Alkaline Phosphatase: 93 IU/L (ref 39–117)
BUN/Creatinine Ratio: 18 (ref 12–28)
BUN: 16 mg/dL (ref 8–27)
Bilirubin Total: 0.4 mg/dL (ref 0.0–1.2)
CALCIUM: 9.3 mg/dL (ref 8.7–10.3)
CHLORIDE: 106 mmol/L (ref 96–106)
CO2: 21 mmol/L (ref 20–29)
Creatinine, Ser: 0.91 mg/dL (ref 0.57–1.00)
GFR calc Af Amer: 76 mL/min/{1.73_m2} (ref >59)
GFR, EST NON AFRICAN AMERICAN: 66 mL/min/{1.73_m2} (ref >59)
GLOBULIN, TOTAL: 2.4 g/dL (ref 1.5–4.5)
Glucose: 113 mg/dL — ABNORMAL HIGH (ref 65–99)
Potassium: 4.6 mmol/L (ref 3.5–5.2)
Sodium: 141 mmol/L (ref 134–144)
Total Protein: 6.4 g/dL (ref 6.0–8.5)

## 2018-05-10 LAB — LIPID PANEL
CHOLESTEROL TOTAL: 178 mg/dL (ref 100–199)
Chol/HDL Ratio: 3.1 ratio (ref 0.0–4.4)
HDL: 58 mg/dL (ref >39)
LDL CALC: 110 mg/dL — AB (ref 0–99)
TRIGLYCERIDES: 49 mg/dL (ref 0–149)
VLDL Cholesterol Cal: 10 mg/dL (ref 5–40)

## 2018-06-02 MED FILL — LETROZOLE 2.5 MG TABLET: 2.5 | 90 days supply | Qty: 90 | Fill #3

## 2018-06-05 ENCOUNTER — Telehealth: Payer: Self-pay | Admitting: *Deleted

## 2018-06-05 DIAGNOSIS — Z5181 Encounter for therapeutic drug level monitoring: Secondary | ICD-10-CM

## 2018-06-05 DIAGNOSIS — E785 Hyperlipidemia, unspecified: Secondary | ICD-10-CM

## 2018-06-05 MED ORDER — ROSUVASTATIN CALCIUM 10 MG PO TABS: 10.0000 mg | ORAL_TABLET | Freq: Every day | ORAL | 3 refills | Status: DC

## 2018-06-05 NOTE — Telephone Encounter (Signed)
Advised patient, orders in Epic

## 2018-06-05 NOTE — Telephone Encounter (Signed)
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  Can you call this in to her pharmacy and let her know?  She needs to get lipids/CMP checked in 6-8 weeks.  Thanks!

## 2018-06-06 MED FILL — ROSUVASTATIN CALCIUM 10 MG: 10 | 90 days supply | Qty: 90 | Fill #0

## 2018-06-29 ENCOUNTER — Encounter (HOSPITAL_COMMUNITY): Payer: Self-pay

## 2018-06-29 NOTE — Pre-Procedure Instructions (Signed)
The following are in epic: EKG 10/05/2017 Cardiac clearance telephone encounter 04/20/2018  The following are in chart: Dr. Inda Merlin surgical clearance 04/19/2018 Cardiac clearance 04/20/2018

## 2018-06-29 NOTE — Patient Instructions (Addendum)
Your procedure is scheduled on: Monday, Dec. 16, 2019   Surgery Time:  8:20AM-9:10AM   Report to [facility]  Entrance    Report to admitting at 5:45 AM   Call this number if you have problems the morning of surgery [phone]   Do not eat food or drink liquids :After Midnight.   Brush your teeth the morning of surgery.   Do NOT smoke after Midnight   Take these medicines the morning of surgery with A SIP OF WATER: Trimethoprim                               You may not have any metal on your body including hair pins, jewelry, and body piercings             Do not wear make-up, lotions, powders, perfumes/cologne, or deodorant             Do not wear nail polish.  Do not shave  48 hours prior to surgery.                Do not bring valuables to the hospital. [place].   Contacts, dentures or bridgework may not be worn into surgery.   Leave suitcase in the car. After surgery it may be brought to your room.   Special Instructions: Bring a copy of your healthcare power of attorney and living will documents         the day of surgery if you haven't scanned them in before.              Please read over the following fact sheets you were given:  [facility] - Preparing for Surgery Before surgery, you can play an important role.  Because skin is not sterile, your skin needs to be as free of germs as possible.  You can reduce the number of germs on your skin by washing with CHG (chlorahexidine gluconate) soap before surgery.  CHG is an antiseptic cleaner which kills germs and bonds with the skin to continue killing germs even after washing. Please DO NOT use if you have an allergy to CHG or antibacterial soaps.  If your skin becomes reddened/irritated stop using the CHG and inform your nurse when you arrive at Short Stay. Do not shave (including legs and underarms) for at least 48 hours prior to the first CHG shower.   You may shave your face/neck.  Please follow these instructions carefully:  1.  Shower with CHG Soap the night before surgery and the  morning of surgery.  2.  If you choose to wash your hair, wash your hair first as usual with your normal  shampoo.  3.  After you shampoo, rinse your hair and body thoroughly to remove the shampoo.                             4.  Use CHG as you would any other liquid soap.  You can apply chg directly to the skin and wash.  Gently with a scrungie or clean washcloth.  5.  Apply the CHG Soap to your body ONLY FROM THE NECK DOWN.   Do   not use on face/ open  Wound or open sores. Avoid contact with eyes, ears mouth and   genitals (private parts).                       Wash face,  Genitals (private parts) with your normal soap.             6.  Wash thoroughly, paying special attention to the area where your    surgery  will be performed.  7.  Thoroughly rinse your body with warm water from the neck down.  8.  DO NOT shower/wash with your normal soap after using and rinsing off the CHG Soap.                9.  Pat yourself dry with a clean towel.            10.  Wear clean pajamas.            11.  Place clean sheets on your bed the night of your first shower and do not  sleep with pets. Day of Surgery : Do not apply any lotions/deodorants the morning of surgery.  Please wear clean clothes to the hospital/surgery center.  FAILURE TO FOLLOW THESE INSTRUCTIONS MAY RESULT IN THE CANCELLATION OF YOUR SURGERY  PATIENT SIGNATURE_________________________________  NURSE SIGNATURE__________________________________  ________________________________________________________________________   Melissa [name]  An incentive spirometer is a tool that can help keep your lungs clear and active. This tool measures how well you are filling your lungs with each breath. Taking long deep breaths may help reverse or decrease the chance of developing  breathing (pulmonary) problems (especially infection) following:  A long period of time when you are unable to move or be active. BEFORE THE PROCEDURE   If the spirometer includes an indicator to show your best effort, your nurse or respiratory therapist will set it to a desired goal.  If possible, sit up straight or lean slightly forward. Try not to slouch.  Hold the incentive spirometer in an upright position. INSTRUCTIONS FOR USE  1. Sit on the edge of your bed if possible, or sit up as far as you can in bed or on a chair. 2. Hold the incentive spirometer in an upright position. 3. Breathe out normally. 4. Place the mouthpiece in your mouth and seal your lips tightly around it. 5. Breathe in slowly and as deeply as possible, raising the piston or the ball toward the top of the column. 6. Hold your breath for 3-5 seconds or for as long as possible. Allow the piston or ball to fall to the bottom of the column. 7. Remove the mouthpiece from your mouth and breathe out normally. 8. Rest for a few seconds and repeat Steps 1 through 7 at least 10 times every 1-2 hours when you are awake. Take your time and take a few normal breaths between deep breaths. 9. The spirometer may include an indicator to show your best effort. Use the indicator as a goal to work toward during each repetition. 10. After each set of 10 deep breaths, practice coughing to be sure your lungs are clear. If you have an incision (the cut made at the time of surgery), support your incision when coughing by placing a pillow or rolled up towels firmly against it. Once you are able to get out of bed, walk around indoors and cough well. You may stop using the incentive spirometer when instructed by your caregiver.  RISKS AND COMPLICATIONS  Take your time  so you do not get dizzy or light-headed.  If you are in pain, you may need to take or ask for pain medication before doing incentive spirometry. It is harder to take a deep  breath if you are having pain. AFTER USE  Rest and breathe slowly and easily.  It can be helpful to keep track of a log of your progress. Your caregiver can provide you with a simple table to help with this. If you are using the spirometer at home, follow these instructions: [place] IF:   You are having difficultly using the spirometer.  You have trouble using the spirometer as often as instructed.  Your pain medication is not giving enough relief while using the spirometer.  You develop fever of 100.5 F (38.1 C) or higher. SEEK IMMEDIATE MEDICAL CARE IF:   You cough up bloody sputum that had not been present before.  You develop fever of 102 F (38.9 C) or greater.  You develop worsening pain at or near the incision site. MAKE SURE YOU:   Understand these instructions.  Will watch your condition.  Will get help right away if you are not doing well or get worse. Document Released: 11/22/2006 Document Revised: 10/04/2011 Document Reviewed: 01/23/2007 ExitCare Patient Information 2014 ExitCare, [place].   ________________________________________________________________________  WHAT IS A BLOOD TRANSFUSION? Blood Transfusion Information  A transfusion is the replacement of blood or some of its parts. Blood is made up of multiple cells which provide different functions.  Red blood cells carry oxygen and are used for blood loss replacement.  White blood cells fight against infection.  Platelets control bleeding.  Plasma helps clot blood.  Other blood products are available for specialized needs, such as hemophilia or other clotting disorders. BEFORE THE TRANSFUSION  Who gives blood for transfusions?   Healthy volunteers who are fully evaluated to make sure their blood is safe. This is blood bank blood. Transfusion therapy is the safest it has ever been in the practice of medicine. Before blood is taken from a donor, a complete history is taken to make sure  that person has no history of diseases nor engages in risky social behavior (examples are intravenous drug use or sexual activity with multiple partners). The donor's travel history is screened to minimize risk of transmitting infections, such as malaria. The donated blood is tested for signs of infectious diseases, such as HIV and hepatitis. The blood is then tested to be sure it is compatible with you in order to minimize the chance of a transfusion reaction. If you or a relative donates blood, this is often done in anticipation of surgery and is not appropriate for emergency situations. It takes many days to process the donated blood. RISKS AND COMPLICATIONS Although transfusion therapy is very safe and saves many lives, the main dangers of transfusion include:   Getting an infectious disease.  Developing a transfusion reaction. This is an allergic reaction to something in the blood you were given. Every precaution is taken to prevent this. The decision to have a blood transfusion has been considered carefully by your caregiver before blood is given. Blood is not given unless the benefits outweigh the risks. AFTER THE TRANSFUSION  Right after receiving a blood transfusion, you will usually feel much better and more energetic. This is especially true if your red blood cells have gotten low (anemic). The transfusion raises the level of the red blood cells which carry oxygen, and this usually causes an energy increase.  The  nurse administering the transfusion will monitor you carefully for complications. HOME CARE INSTRUCTIONS  No special instructions are needed after a transfusion. You may find your energy is better. Speak with your caregiver about any limitations on activity for underlying diseases you may have. SEEK MEDICAL CARE IF:   Your condition is not improving after your transfusion.  You develop redness or irritation at the intravenous (IV) site. SEEK IMMEDIATE MEDICAL CARE IF:  Any of  the following symptoms occur over the next 12 hours:  Shaking chills.  You have a temperature by mouth above 102 F (38.9 C), not controlled by medicine.  Chest, back, or muscle pain.  People around you feel you are not acting correctly or are confused.  Shortness of breath or difficulty breathing.  Dizziness and fainting.  You get a rash or develop hives.  You have a decrease in urine output.  Your urine turns a dark color or changes to pink, red, or brown. Any of the following symptoms occur over the next 10 days:  You have a temperature by mouth above 102 F (38.9 C), not controlled by medicine.  Shortness of breath.  Weakness after normal activity.  The white part of the eye turns yellow (jaundice).  You have a decrease in the amount of urine or are urinating less often.  Your urine turns a dark color or changes to pink, red, or brown. Document Released: 07/09/2000 Document Revised: 10/04/2011 Document Reviewed: 02/26/2008 [facility] Patient Information 2014 [place], [place].  _______________________________________________________________________

## 2018-07-03 ENCOUNTER — Encounter (HOSPITAL_COMMUNITY)
Admission: RE | Admit: 2018-07-03 | Discharge: 2018-07-03 | Disposition: A | Discharge status: Home / Self Care | Payer: 59 | Source: Ambulatory Visit | Attending: Orthopedic Surgery | Admitting: Orthopedic Surgery

## 2018-07-03 ENCOUNTER — Encounter (HOSPITAL_COMMUNITY): Payer: Self-pay

## 2018-07-03 ENCOUNTER — Other Ambulatory Visit: Payer: Self-pay

## 2018-07-03 DIAGNOSIS — M1712 Unilateral primary osteoarthritis, left knee: Secondary | ICD-10-CM | POA: Diagnosis not present

## 2018-07-03 DIAGNOSIS — Z01812 Encounter for preprocedural laboratory examination: Secondary | ICD-10-CM | POA: Insufficient documentation

## 2018-07-03 HISTORY — DX: Unspecified osteoarthritis, unspecified site: M19.90

## 2018-07-03 HISTORY — DX: Deep phlebothrombosis in pregnancy, unspecified trimester: O22.30

## 2018-07-03 HISTORY — DX: Diverticulitis of intestine, part unspecified, without perforation or abscess without bleeding: K57.92

## 2018-07-03 HISTORY — DX: Personal history of other diseases of the female genital tract: Z87.42

## 2018-07-03 HISTORY — DX: Rheumatic tricuspid insufficiency: I07.1

## 2018-07-03 HISTORY — DX: Nonrheumatic mitral (valve) insufficiency: I34.0

## 2018-07-03 HISTORY — DX: Nonrheumatic aortic (valve) insufficiency: I35.1

## 2018-07-03 HISTORY — DX: Other intervertebral disc degeneration, lumbar region: M51.36

## 2018-07-03 HISTORY — DX: Obesity, unspecified: E66.9

## 2018-07-03 LAB — COMPREHENSIVE METABOLIC PANEL
ALT: 19 U/L (ref 0–44)
AST: 22 U/L (ref 15–41)
Albumin: 4 g/dL (ref 3.5–5.0)
Alkaline Phosphatase: 82 U/L (ref 38–126)
Anion gap: 8 (ref 5–15)
BILIRUBIN TOTAL: 0.4 mg/dL (ref 0.3–1.2)
BUN: 19 mg/dL (ref 8–23)
CO2: 25 mmol/L (ref 22–32)
Calcium: 9.3 mg/dL (ref 8.9–10.3)
Chloride: 108 mmol/L (ref 98–111)
Creatinine, Ser: 0.94 mg/dL (ref 0.44–1.00)
GFR calc Af Amer: >60 mL/min (ref >60)
GFR calc non Af Amer: >60 mL/min (ref >60)
Glucose, Bld: 115 mg/dL — ABNORMAL HIGH (ref 70–99)
Potassium: 5 mmol/L (ref 3.5–5.1)
Sodium: 141 mmol/L (ref 135–145)
TOTAL PROTEIN: 7.1 g/dL (ref 6.5–8.1)

## 2018-07-03 LAB — CBC
HCT: 41 % (ref 36.0–46.0)
Hemoglobin: 12.6 g/dL (ref 12.0–15.0)
MCH: 27.3 pg (ref 26.0–34.0)
MCHC: 30.7 g/dL (ref 30.0–36.0)
MCV: 88.9 fL (ref 80.0–100.0)
Platelets: 237 10*3/uL (ref 150–400)
RBC: 4.61 MIL/uL (ref 3.87–5.11)
RDW: 15.4 % (ref 11.5–15.5)
WBC: 4.8 10*3/uL (ref 4.0–10.5)
nRBC: 0 % (ref 0.0–0.2)

## 2018-07-03 LAB — APTT: aPTT: 30 seconds (ref 24–36)

## 2018-07-03 LAB — ABO/RH: ABO/RH(D): A POS

## 2018-07-03 LAB — SURGICAL PCR SCREEN
MRSA, PCR: NEGATIVE
Staphylococcus aureus: NEGATIVE

## 2018-07-03 LAB — PROTIME-INR
INR: 0.9
PROTHROMBIN TIME: 12 s (ref 11.4–15.2)

## 2018-07-07 NOTE — H&P (Signed)
TOTAL KNEE ADMISSION H&P  Patient is being admitted for left total knee arthroplasty.  Subjective:  Chief Complaint:left knee pain.  HPI: Melissa [name], 67 y.o. female, has a history of pain and functional disability in the left knee due to arthritis and has failed non-surgical conservative treatments for greater than 12 weeks to includecorticosteriod injections, viscosupplementation injections and activity modification.  Onset of symptoms was gradual, starting 6 years ago with gradually worsening course since that time. The patient noted no past surgery on the left knee(s).  Patient currently rates pain in the left knee(s) at 10 out of 10 with activity. Patient has worsening of pain with activity and weight bearing, crepitus, joint swelling and instability.  Patient has evidence of medial and patellofemoral bone-on-bone arthritis with varus deformity by imaging studies. There is no active infection.  Patient Active Problem List   Diagnosis Date Noted  . Diastolic dysfunction without heart failure 09/01/2015  . Shortness of breath 06/26/2015  . Hx of radiation therapy   . Arthritis   . Breast cancer, left breast ([place]) 01/07/2012  . Recurrent breast cancer ([place]) 01/04/2012  . Chronic UTI (urinary tract infection) 11/09/2011  . Breast cancer ([place]) 07/23/1997   Past Medical History:  Diagnosis Date  . Anemia   . AR (aortic regurgitation) 06/2015   Trivial, Noted on ECHO  . Breast cancer ([place]) 07/23/97   left, tx w/xrt, Tamoxifen x 5 yrs  . Chronic pain    left knee  . DDD (degenerative disc disease), lumbar 04/05/2018   xray  . Diastolic dysfunction without heart failure 09/01/2015   Grade 1 diastolic dysfunction on echo 06/2015  . Diverticulitis    pt unaware  . DVT complicating pregnancy    Right calf, surface  . History of bladder infections   . History of endometriosis   . Hx of radiation therapy 08/22/97 - 10/03/97   left breast  . MR (mitral regurgitation) 06/2015   Noted  on ECHO  . OA (osteoarthritis)    Bilateral Knees  . Obesity   . Personal history of radiation therapy   . Recurrent breast cancer ([place]) 01/04/12   biopsy, ER/PR+, Her 2 -  . Shortness of breath 06/26/2015, 09/07/2016  . TR (tricuspid regurgitation) 06/2015   Noted on ECHO    Past Surgical History:  Procedure Laterality Date  . ABDOMINAL HYSTERECTOMY  1987   endometriosos  . BREAST BIOPSY Left 01/04/2012  . BREAST LUMPECTOMY  02/14/2012   LUMPECTOMY;  Surgeon: Haywood Lasso, MD;  Location: [facility];  Service: General;  Laterality: Left;  . [place]   lumpectomy-left  . CESAREAN SECTION     x 3  . CHOLECYSTECTOMY  1993  . COLONOSCOPY    . HERNIA REPAIR  0932   umbilical   . ovary removed  1987    No current facility-administered medications for this encounter.    Current Outpatient Medications  Medication Sig Dispense Refill Last Dose  . aspirin EC 81 MG tablet Take 81 mg by mouth daily.   Taking  . Cholecalciferol (VITAMIN D3) 25 MCG (1000 UT) CAPS Take 1 capsule by mouth daily.    Taking  . estradiol (ESTRACE) 0.1 MG/GM vaginal cream Place 5 g vaginally once a week. Once symptoms improve, decrease frequency of esterase to once weekly and further decrease based on symptoms   Taking  . letrozole (FEMARA) 2.5 MG tablet TAKE 1 TABLET BY MOUTH DAILY. 90 tablet 3 Taking  . Naproxen  Sodium (ALEVE) 220 MG CAPS Take 440 mg by mouth 2 (two) times daily as needed (pain).    07/02/2018 at Unknown time  . rosuvastatin (CRESTOR) 10 MG tablet Take 1 tablet (10 mg total) by mouth daily. 90 tablet 3   . trimethoprim (TRIMPEX) 100 MG tablet Take 100 mg by mouth 2 (two) times daily.   Taking   No Known Allergies  Social History   Tobacco Use  . Smoking status: Never Smoker  . Smokeless tobacco: Never Used  Substance Use Topics  . Alcohol use: No    Family History  Problem Relation Age of Onset  . Cancer Mother 58       colon  . Cancer Maternal Aunt        colon  . Breast  cancer Maternal Aunt   . Cancer Maternal Grandfather        prostate  . Cancer Maternal Aunt        colon  . Prostate cancer Maternal Uncle   . Heart attack Father   . Heart attack Brother   . Heart attack Paternal Grandfather   . Heart failure Sister   . Cancer Paternal Aunt        breast     Review of Systems  Constitutional: Negative for chills and fever.  HENT: Negative for congestion, sore throat and tinnitus.   Eyes: Negative for double vision, photophobia and pain.  Respiratory: Negative for cough, shortness of breath and wheezing.   Cardiovascular: Negative for chest pain, palpitations and orthopnea.  Gastrointestinal: Negative for heartburn, nausea and vomiting.  Genitourinary: Negative for dysuria, frequency and urgency.  Musculoskeletal: Positive for joint pain.  Neurological: Negative for dizziness, weakness and headaches.    Objective:  Physical Exam  Well nourished and well developed. General: Alert and oriented x3, cooperative and pleasant, no acute distress. Head: normocephalic, atraumatic, neck supple. Eyes: EOMI. Respiratory: breath sounds clear in all fields, no wheezing, rales, or rhonchi. Cardiovascular: Regular rate and rhythm, no murmurs, gallops or rubs.  Abdomen: non-tender to palpation and soft, normoactive bowel sounds. Musculoskeletal: Nonantalgic gait without using assisted devices.  Left Knee Exam: No effusion. Range of motion is 0-135 degrees. Marked crepitus on range of motion of the knee. Some medial greater than lateral joint line tenderness. Stable knee. Calves soft and nontender. Motor function intact in LE. Strength 5/5 LE bilaterally. Neuro: Distal pulses 2+. Sensation to light touch intact in LE.  Vital signs in last 24 hours:  Blood pressure: 108/78 mmHg Pulse: 72 bpm  Labs:   Estimated body mass index is 36.32 kg/m as calculated from the following:   Height as of 07/03/18: 5\' 6"  (1.676 m).   Weight as of 07/03/18: 102.1  kg.   Imaging Review Plain radiographs demonstrate severe degenerative joint disease of the left knee(s). The overall alignment issignificant varus. The bone quality appears to be adequate for age and reported activity level.   Preoperative templating of the joint replacement has been completed, documented, and submitted to the Operating Room personnel in order to optimize intra-operative equipment management.   Anticipated LOS equal to or greater than 2 midnights due to - Age 55 and older with one or more of the following:  - Obesity  - Expected need for hospital services (PT, OT, Nursing) required for safe  discharge  - Anticipated need for postoperative skilled nursing care or inpatient rehab  - Active co-morbidities: DVT/VTE OR   - Unanticipated findings during/Post Surgery: None  - Patient  is a high risk of re-admission due to: None     Assessment/Plan:  End stage arthritis, left knee   The patient history, physical examination, clinical judgment of the provider and imaging studies are consistent with end stage degenerative joint disease of the left knee(s) and total knee arthroplasty is deemed medically necessary. The treatment options including medical management, injection therapy arthroscopy and arthroplasty were discussed at length. The risks and benefits of total knee arthroplasty were presented and reviewed. The risks due to aseptic loosening, infection, stiffness, patella tracking problems, thromboembolic complications and other imponderables were discussed. The patient acknowledged the explanation, agreed to proceed with the plan and consent was signed. Patient is being admitted for inpatient treatment for surgery, pain control, PT, OT, prophylactic antibiotics, VTE prophylaxis, progressive ambulation and ADL's and discharge planning. The patient is planning to be discharged home with outpatient physical therapy.   Therapy Plans: outpatient therapy at  [facility] Disposition: Home with husband Planned DVT Prophylaxis: Xarelto 10 mg daily (hx breast CA and hx DVT) DME needed: Gil[facility] Rile PCP: Dr. Inda Merlin Cardiologist: Dr. Oval Linsey TXA: Topical  Allergies: NKDA Anesthesia Concerns: None Other: NO BP or IV STICKS LEFT ARM  - Patient was instructed on what medications to stop prior to surgery. - Follow-up visit in 2 weeks with Dr. Wynelle Link - Begin physical therapy following surgery - Pre-operative lab work as pre-surgical testing - Prescriptions will be provided in hospital at time of discharge  Theresa Duty, PA-C Orthopedic Surgery [facility] [place]

## 2018-07-09 MED ORDER — BUPIVACAINE LIPOSOME 1.3 % IJ SUSP
20.0000 mL | Freq: Once | INTRAMUSCULAR | Status: DC
  Filled 2018-07-09: qty 20

## 2018-07-09 MED ORDER — TRANEXAMIC ACID 1000 MG/10ML IV SOLN
2000.0000 mg | INTRAVENOUS | Status: DC
  Filled 2018-07-09: qty 20

## 2018-07-09 NOTE — Anesthesia Preprocedure Evaluation (Addendum)
Anesthesia Evaluation  Patient identified by MRN, date of birth, ID band Patient awake    Reviewed: Allergy & Precautions, NPO status , Patient's Chart, lab work & pertinent test results  History of Anesthesia Complications Negative for: history of anesthetic complications  Airway Mallampati: II  TM Distance: >3 FB Neck ROM: Full    Dental  (+) Dental Advisory Given   Pulmonary neg pulmonary ROS,    breath sounds clear to auscultation       Cardiovascular + DVT   Rhythm:Regular Rate:Normal   '16 Nuclear Stress - Low risk stress nuclear study with a small, mild, fixed apical defect consistent with soft tissue attenuation/apical thinning; no ischemia; EF 57 with normal wall motion.    Neuro/Psych negative neurological ROS  negative psych ROS   GI/Hepatic negative GI ROS, Neg liver ROS,   Endo/Other   Obesity   Renal/GU negative Renal ROS     Musculoskeletal  (+) Arthritis , Osteoarthritis,    Abdominal   Peds  Hematology negative hematology ROS (+)   Anesthesia Other Findings   Reproductive/Obstetrics                            Anesthesia Physical Anesthesia Plan  ASA: II  Anesthesia Plan: Spinal   Post-op Pain Management:  [place] for Post-op pain   Induction: Intravenous  PONV Risk Score and Plan: 2 and Propofol infusion and Treatment may vary due to age or medical condition  Airway Management Planned: Nasal Cannula and Natural Airway  Additional Equipment: None  Intra-op Plan:   Post-operative Plan:   Informed Consent: I have reviewed the patients History and Physical, chart, labs and discussed the procedure including the risks, benefits and alternatives for the proposed anesthesia with the patient or authorized representative who has indicated his/her understanding and acceptance.     Plan Discussed with: CRNA and Anesthesiologist  Anesthesia Plan Comments: (Labs  reviewed, platelets acceptable. Discussed risks and benefits of spinal, including spinal/epidural hematoma, infection, failed block, and PDPH. Patient expressed understanding and wished to proceed. )       Anesthesia Quick Evaluation

## 2018-07-10 ENCOUNTER — Encounter (HOSPITAL_COMMUNITY): Payer: Self-pay

## 2018-07-10 ENCOUNTER — Other Ambulatory Visit: Payer: Self-pay

## 2018-07-10 ENCOUNTER — Telehealth (HOSPITAL_COMMUNITY): Payer: Self-pay | Admitting: *Deleted

## 2018-07-10 ENCOUNTER — Inpatient Hospital Stay (HOSPITAL_COMMUNITY)
Admission: RE | Admit: 2018-07-10 | Discharge: 2018-07-11 | DRG: 470 | Disposition: A | Discharge status: Home / Self Care | Payer: 59 | Attending: Orthopedic Surgery | Admitting: Orthopedic Surgery

## 2018-07-10 ENCOUNTER — Inpatient Hospital Stay (HOSPITAL_COMMUNITY): Payer: 59 | Admitting: Anesthesiology

## 2018-07-10 ENCOUNTER — Encounter (HOSPITAL_COMMUNITY)
Admission: RE | Disposition: A | Discharge status: Home / Self Care | Payer: Self-pay | Source: Home / Self Care | Attending: Orthopedic Surgery

## 2018-07-10 DIAGNOSIS — Z923 Personal history of irradiation: Secondary | ICD-10-CM

## 2018-07-10 DIAGNOSIS — I083 Combined rheumatic disorders of mitral, aortic and tricuspid valves: Secondary | ICD-10-CM | POA: Diagnosis present

## 2018-07-10 DIAGNOSIS — Z87440 Personal history of urinary (tract) infections: Secondary | ICD-10-CM | POA: Diagnosis not present

## 2018-07-10 DIAGNOSIS — G8929 Other chronic pain: Secondary | ICD-10-CM | POA: Diagnosis present

## 2018-07-10 DIAGNOSIS — Z7982 Long term (current) use of aspirin: Secondary | ICD-10-CM | POA: Diagnosis not present

## 2018-07-10 DIAGNOSIS — Z803 Family history of malignant neoplasm of breast: Secondary | ICD-10-CM

## 2018-07-10 DIAGNOSIS — Z79811 Long term (current) use of aromatase inhibitors: Secondary | ICD-10-CM

## 2018-07-10 DIAGNOSIS — Z90710 Acquired absence of both cervix and uterus: Secondary | ICD-10-CM | POA: Diagnosis not present

## 2018-07-10 DIAGNOSIS — Z6836 Body mass index (BMI) 36.0-36.9, adult: Secondary | ICD-10-CM

## 2018-07-10 DIAGNOSIS — G8918 Other acute postprocedural pain: Secondary | ICD-10-CM | POA: Diagnosis not present

## 2018-07-10 DIAGNOSIS — Z853 Personal history of malignant neoplasm of breast: Secondary | ICD-10-CM

## 2018-07-10 DIAGNOSIS — Z8249 Family history of ischemic heart disease and other diseases of the circulatory system: Secondary | ICD-10-CM | POA: Diagnosis not present

## 2018-07-10 DIAGNOSIS — Z79899 Other long term (current) drug therapy: Secondary | ICD-10-CM

## 2018-07-10 DIAGNOSIS — Z791 Long term (current) use of non-steroidal anti-inflammatories (NSAID): Secondary | ICD-10-CM | POA: Diagnosis not present

## 2018-07-10 DIAGNOSIS — E669 Obesity, unspecified: Secondary | ICD-10-CM | POA: Diagnosis present

## 2018-07-10 DIAGNOSIS — M1710 Unilateral primary osteoarthritis, unspecified knee: Secondary | ICD-10-CM

## 2018-07-10 DIAGNOSIS — Z9049 Acquired absence of other specified parts of digestive tract: Secondary | ICD-10-CM | POA: Diagnosis not present

## 2018-07-10 DIAGNOSIS — M1712 Unilateral primary osteoarthritis, left knee: Secondary | ICD-10-CM | POA: Diagnosis not present

## 2018-07-10 DIAGNOSIS — M179 Osteoarthritis of knee, unspecified: Secondary | ICD-10-CM | POA: Diagnosis present

## 2018-07-10 HISTORY — PX: TOTAL KNEE ARTHROPLASTY: SHX125

## 2018-07-10 LAB — TYPE AND SCREEN
ABO/RH(D): A POS
Antibody Screen: NEGATIVE

## 2018-07-10 SURGERY — ARTHROPLASTY, KNEE, TOTAL
Anesthesia: Monitor Anesthesia Care | Site: Knee | Laterality: Left

## 2018-07-10 MED ORDER — BISACODYL 10 MG RE SUPP: 10.0000 mg | Freq: Every day | RECTAL | Status: DC | PRN

## 2018-07-10 MED ORDER — POLYETHYLENE GLYCOL 3350 17 G PO PACK: 17.0000 g | PACK | Freq: Every day | ORAL | Status: DC | PRN

## 2018-07-10 MED ORDER — CEFAZOLIN SODIUM-DEXTROSE 2-4 GM/100ML-% IV SOLN
2.0000 g | Freq: Four times a day (QID) | INTRAVENOUS | Status: AC
  Administered 2018-07-10 (×2): 2 g via INTRAVENOUS
  Filled 2018-07-10: qty 100

## 2018-07-10 MED ORDER — SUGAMMADEX SODIUM 200 MG/2ML IV SOLN
INTRAVENOUS | Status: DC | PRN
  Administered 2018-07-10: 200 mg via INTRAVENOUS

## 2018-07-10 MED ORDER — SODIUM CHLORIDE (PF) 0.9 % IJ SOLN
INTRAMUSCULAR | Status: AC
  Filled 2018-07-10: qty 50

## 2018-07-10 MED ORDER — FENTANYL CITRATE (PF) 100 MCG/2ML IJ SOLN: 25.0000 ug | INTRAMUSCULAR | Status: DC | PRN

## 2018-07-10 MED ORDER — ACETAMINOPHEN 500 MG PO TABS
1000.0000 mg | ORAL_TABLET | Freq: Four times a day (QID) | ORAL | Status: AC
  Administered 2018-07-10 – 2018-07-11 (×4): 1000 mg via ORAL
  Filled 2018-07-10 (×3): qty 2

## 2018-07-10 MED ORDER — ONDANSETRON HCL 4 MG/2ML IJ SOLN
INTRAMUSCULAR | Status: AC
  Filled 2018-07-10: qty 2

## 2018-07-10 MED ORDER — ROPIVACAINE HCL 7.5 MG/ML IJ SOLN
INTRAMUSCULAR | Status: DC | PRN
  Administered 2018-07-10: 20 mL via PERINEURAL

## 2018-07-10 MED ORDER — FENTANYL CITRATE (PF) 100 MCG/2ML IJ SOLN
INTRAMUSCULAR | Status: AC
  Filled 2018-07-10: qty 2

## 2018-07-10 MED ORDER — DEXAMETHASONE SODIUM PHOSPHATE 10 MG/ML IJ SOLN
INTRAMUSCULAR | Status: AC
  Filled 2018-07-10: qty 1

## 2018-07-10 MED ORDER — PHENYLEPHRINE 40 MCG/ML (10ML) SYRINGE FOR IV PUSH (FOR BLOOD PRESSURE SUPPORT)
PREFILLED_SYRINGE | INTRAVENOUS | Status: DC | PRN
  Administered 2018-07-10: 80 ug via INTRAVENOUS
  Administered 2018-07-10: 120 ug via INTRAVENOUS

## 2018-07-10 MED ORDER — METHOCARBAMOL 500 MG PO TABS
500.0000 mg | ORAL_TABLET | Freq: Four times a day (QID) | ORAL | Status: DC | PRN
  Administered 2018-07-10 – 2018-07-11 (×2): 500 mg via ORAL
  Filled 2018-07-10 (×2): qty 1

## 2018-07-10 MED ORDER — GABAPENTIN 300 MG PO CAPS
300.0000 mg | ORAL_CAPSULE | Freq: Once | ORAL | Status: AC
  Administered 2018-07-10: 300 mg via ORAL
  Filled 2018-07-10: qty 1

## 2018-07-10 MED ORDER — MENTHOL 3 MG MT LOZG: 1.0000 | LOZENGE | OROMUCOSAL | Status: DC | PRN

## 2018-07-10 MED ORDER — TRIMETHOPRIM 100 MG PO TABS
100.0000 mg | ORAL_TABLET | Freq: Two times a day (BID) | ORAL | Status: DC
  Administered 2018-07-10 – 2018-07-11 (×2): 100 mg via ORAL
  Filled 2018-07-10 (×2): qty 1

## 2018-07-10 MED ORDER — DOCUSATE SODIUM 100 MG PO CAPS
100.0000 mg | ORAL_CAPSULE | Freq: Two times a day (BID) | ORAL | Status: DC
  Administered 2018-07-10 – 2018-07-11 (×2): 100 mg via ORAL
  Filled 2018-07-10 (×2): qty 1

## 2018-07-10 MED ORDER — FENTANYL CITRATE (PF) 100 MCG/2ML IJ SOLN
50.0000 ug | INTRAMUSCULAR | Status: DC
  Administered 2018-07-10: 50 ug via INTRAVENOUS
  Administered 2018-07-10: 100 ug via INTRAVENOUS

## 2018-07-10 MED ORDER — FLEET ENEMA 7-19 GM/118ML RE ENEM: 1.0000 | ENEMA | Freq: Once | RECTAL | Status: DC | PRN

## 2018-07-10 MED ORDER — LACTATED RINGERS IV SOLN
INTRAVENOUS | Status: DC
  Administered 2018-07-10: 10:00:00 via INTRAVENOUS
  Administered 2018-07-10: 1000 mL via INTRAVENOUS

## 2018-07-10 MED ORDER — SODIUM CHLORIDE 0.9 % IR SOLN
Status: DC | PRN
  Administered 2018-07-10: 1000 mL

## 2018-07-10 MED ORDER — BUPIVACAINE HCL (PF) 0.25 % IJ SOLN
INTRAMUSCULAR | Status: AC
  Filled 2018-07-10: qty 30

## 2018-07-10 MED ORDER — PROPOFOL 10 MG/ML IV BOLUS
INTRAVENOUS | Status: DC | PRN
  Administered 2018-07-10: 150 mg via INTRAVENOUS

## 2018-07-10 MED ORDER — ACETAMINOPHEN 10 MG/ML IV SOLN
1000.0000 mg | Freq: Four times a day (QID) | INTRAVENOUS | Status: DC
  Administered 2018-07-10: 1000 mg via INTRAVENOUS
  Filled 2018-07-10: qty 100

## 2018-07-10 MED ORDER — MIDAZOLAM HCL 2 MG/2ML IJ SOLN: 0.5000 mg | INTRAMUSCULAR | Status: DC

## 2018-07-10 MED ORDER — BUPIVACAINE LIPOSOME 1.3 % IJ SUSP
INTRAMUSCULAR | Status: DC | PRN
  Administered 2018-07-10: 20 mL

## 2018-07-10 MED ORDER — OXYCODONE HCL 5 MG/5ML PO SOLN
5.0000 mg | Freq: Once | ORAL | Status: DC | PRN
  Filled 2018-07-10: qty 5

## 2018-07-10 MED ORDER — TRANEXAMIC ACID 1000 MG/10ML IV SOLN
INTRAVENOUS | Status: DC | PRN
  Administered 2018-07-10: 2000 mg via TOPICAL

## 2018-07-10 MED ORDER — PHENOL 1.4 % MT LIQD
1.0000 | OROMUCOSAL | Status: DC | PRN
  Filled 2018-07-10: qty 177

## 2018-07-10 MED ORDER — ONDANSETRON HCL 4 MG PO TABS: 4.0000 mg | ORAL_TABLET | Freq: Four times a day (QID) | ORAL | Status: DC | PRN

## 2018-07-10 MED ORDER — ONDANSETRON HCL 4 MG/2ML IJ SOLN
INTRAMUSCULAR | Status: DC | PRN
  Administered 2018-07-10: 4 mg via INTRAVENOUS

## 2018-07-10 MED ORDER — GABAPENTIN 300 MG PO CAPS
300.0000 mg | ORAL_CAPSULE | Freq: Three times a day (TID) | ORAL | Status: DC
  Administered 2018-07-10 – 2018-07-11 (×3): 300 mg via ORAL
  Filled 2018-07-10 (×3): qty 1

## 2018-07-10 MED ORDER — FENTANYL CITRATE (PF) 100 MCG/2ML IJ SOLN
INTRAMUSCULAR | Status: AC
  Administered 2018-07-10: 50 ug via INTRAVENOUS
  Filled 2018-07-10: qty 2

## 2018-07-10 MED ORDER — SODIUM CHLORIDE (PF) 0.9 % IJ SOLN
INTRAMUSCULAR | Status: DC | PRN
  Administered 2018-07-10: 60 mL

## 2018-07-10 MED ORDER — PROPOFOL 10 MG/ML IV BOLUS
INTRAVENOUS | Status: AC
  Filled 2018-07-10: qty 60

## 2018-07-10 MED ORDER — SODIUM CHLORIDE (PF) 0.9 % IJ SOLN
INTRAMUSCULAR | Status: AC
  Filled 2018-07-10: qty 10

## 2018-07-10 MED ORDER — PROPOFOL 10 MG/ML IV BOLUS
INTRAVENOUS | Status: AC
  Filled 2018-07-10: qty 20

## 2018-07-10 MED ORDER — RIVAROXABAN 10 MG PO TABS
10.0000 mg | ORAL_TABLET | Freq: Every day | ORAL | Status: DC
  Administered 2018-07-11: 10 mg via ORAL
  Filled 2018-07-10: qty 1

## 2018-07-10 MED ORDER — CEFAZOLIN SODIUM-DEXTROSE 2-4 GM/100ML-% IV SOLN
2.0000 g | INTRAVENOUS | Status: AC
  Administered 2018-07-10: 2 g via INTRAVENOUS
  Filled 2018-07-10: qty 100

## 2018-07-10 MED ORDER — METHOCARBAMOL 500 MG IVPB - SIMPLE MED
500.0000 mg | Freq: Four times a day (QID) | INTRAVENOUS | Status: DC | PRN
  Filled 2018-07-10: qty 50

## 2018-07-10 MED ORDER — TRAMADOL HCL 50 MG PO TABS
50.0000 mg | ORAL_TABLET | Freq: Four times a day (QID) | ORAL | Status: DC | PRN
  Administered 2018-07-10 – 2018-07-11 (×2): 100 mg via ORAL
  Filled 2018-07-10 (×2): qty 2

## 2018-07-10 MED ORDER — MIDAZOLAM HCL 2 MG/2ML IJ SOLN
INTRAMUSCULAR | Status: AC
  Filled 2018-07-10: qty 2

## 2018-07-10 MED ORDER — ROSUVASTATIN CALCIUM 10 MG PO TABS
10.0000 mg | ORAL_TABLET | Freq: Every day | ORAL | Status: DC
  Administered 2018-07-10: 10 mg via ORAL
  Filled 2018-07-10: qty 1

## 2018-07-10 MED ORDER — METOCLOPRAMIDE HCL 5 MG PO TABS: 5.0000 mg | ORAL_TABLET | Freq: Three times a day (TID) | ORAL | Status: DC | PRN

## 2018-07-10 MED ORDER — PROMETHAZINE HCL 25 MG/ML IJ SOLN: 6.2500 mg | INTRAMUSCULAR | Status: DC | PRN

## 2018-07-10 MED ORDER — DEXAMETHASONE SODIUM PHOSPHATE 10 MG/ML IJ SOLN
8.0000 mg | Freq: Once | INTRAMUSCULAR | Status: AC
  Administered 2018-07-10: 10 mg via INTRAVENOUS

## 2018-07-10 MED ORDER — DIPHENHYDRAMINE HCL 12.5 MG/5ML PO ELIX: 12.5000 mg | ORAL_SOLUTION | ORAL | Status: DC | PRN

## 2018-07-10 MED ORDER — OXYCODONE HCL 5 MG PO TABS
5.0000 mg | ORAL_TABLET | ORAL | Status: DC | PRN
  Administered 2018-07-10 – 2018-07-11 (×2): 5 mg via ORAL
  Filled 2018-07-10: qty 2
  Filled 2018-07-10: qty 1

## 2018-07-10 MED ORDER — ONDANSETRON HCL 4 MG/2ML IJ SOLN
4.0000 mg | Freq: Four times a day (QID) | INTRAMUSCULAR | Status: DC | PRN
  Filled 2018-07-10: qty 2

## 2018-07-10 MED ORDER — LIDOCAINE 2% (20 MG/ML) 5 ML SYRINGE
INTRAMUSCULAR | Status: AC
  Filled 2018-07-10: qty 5

## 2018-07-10 MED ORDER — DEXAMETHASONE SODIUM PHOSPHATE 10 MG/ML IJ SOLN
10.0000 mg | Freq: Once | INTRAMUSCULAR | Status: AC
  Administered 2018-07-11: 10 mg via INTRAVENOUS
  Filled 2018-07-10: qty 1

## 2018-07-10 MED ORDER — PHENYLEPHRINE 40 MCG/ML (10ML) SYRINGE FOR IV PUSH (FOR BLOOD PRESSURE SUPPORT)
PREFILLED_SYRINGE | INTRAVENOUS | Status: AC
  Filled 2018-07-10: qty 10

## 2018-07-10 MED ORDER — CHLORHEXIDINE GLUCONATE 4 % EX LIQD: 60.0000 mL | Freq: Once | CUTANEOUS | Status: DC

## 2018-07-10 MED ORDER — LETROZOLE 2.5 MG PO TABS
2.5000 mg | ORAL_TABLET | Freq: Every day | ORAL | Status: DC
  Administered 2018-07-10: 2.5 mg via ORAL
  Filled 2018-07-10 (×2): qty 1

## 2018-07-10 MED ORDER — METOCLOPRAMIDE HCL 5 MG/ML IJ SOLN: 5.0000 mg | Freq: Three times a day (TID) | INTRAMUSCULAR | Status: DC | PRN

## 2018-07-10 MED ORDER — SODIUM CHLORIDE 0.9 % IV SOLN
INTRAVENOUS | Status: DC
  Administered 2018-07-10 – 2018-07-11 (×2): via INTRAVENOUS

## 2018-07-10 MED ORDER — OXYCODONE HCL 5 MG PO TABS: 5.0000 mg | ORAL_TABLET | Freq: Once | ORAL | Status: DC | PRN

## 2018-07-10 MED ORDER — ROCURONIUM BROMIDE 10 MG/ML (PF) SYRINGE
PREFILLED_SYRINGE | INTRAVENOUS | Status: AC
  Filled 2018-07-10: qty 10

## 2018-07-10 MED ORDER — MORPHINE SULFATE (PF) 2 MG/ML IV SOLN: 1.0000 mg | INTRAVENOUS | Status: DC | PRN

## 2018-07-10 MED ORDER — ROCURONIUM BROMIDE 10 MG/ML (PF) SYRINGE
PREFILLED_SYRINGE | INTRAVENOUS | Status: DC | PRN
  Administered 2018-07-10: 50 mg via INTRAVENOUS

## 2018-07-10 MED ORDER — SUGAMMADEX SODIUM 200 MG/2ML IV SOLN
INTRAVENOUS | Status: AC
  Filled 2018-07-10: qty 2

## 2018-07-10 SURGICAL SUPPLY — 53 items
ATTUNE MED DOME PAT 38 KNEE (Knees) ×2 IMPLANT
ATTUNE PSFEM LTSZ6 NARCEM KNEE (Femur) ×2 IMPLANT
BAG ZIPLOCK 12X15 (MISCELLANEOUS) IMPLANT
BANDAGE ACE 6X5 VEL STRL LF (GAUZE/BANDAGES/DRESSINGS) ×2 IMPLANT
BASE TIBIAL ROT PLAT SZ 5 KNEE (Knees) ×1 IMPLANT
BLADE SAG 18X100X1.27 (BLADE) ×2 IMPLANT
BLADE SAW SGTL 11.0X1.19X90.0M (BLADE) ×2 IMPLANT
BLADE SURG SZ10 CARB STEEL (BLADE) ×4 IMPLANT
BOWL SMART MIX CTS (DISPOSABLE) ×2 IMPLANT
CEMENT HV SMART SET (Cement) ×4 IMPLANT
COVER SURGICAL LIGHT HANDLE (MISCELLANEOUS) ×2 IMPLANT
CUFF TOURN SGL QUICK 34 (TOURNIQUET CUFF) ×1
CUFF TRNQT CYL 34X4X40X1 (TOURNIQUET CUFF) ×1 IMPLANT
DECANTER SPIKE VIAL GLASS SM (MISCELLANEOUS) ×4 IMPLANT
DRAPE U-SHAPE 47X51 STRL (DRAPES) ×2 IMPLANT
DRSG ADAPTIC 3X8 NADH LF (GAUZE/BANDAGES/DRESSINGS) ×2 IMPLANT
DRSG PAD ABDOMINAL 8X10 ST (GAUZE/BANDAGES/DRESSINGS) ×2 IMPLANT
DURAPREP 26ML APPLICATOR (WOUND CARE) ×2 IMPLANT
ELECT REM PT RETURN 15FT ADLT (MISCELLANEOUS) ×2 IMPLANT
EVACUATOR 1/8 PVC DRAIN (DRAIN) ×2 IMPLANT
GAUZE SPONGE 4X4 12PLY STRL (GAUZE/BANDAGES/DRESSINGS) ×2 IMPLANT
GLOVE BIO SURGEON STRL SZ8 (GLOVE) ×2 IMPLANT
GLOVE BIOGEL PI IND STRL 6.5 (GLOVE) ×1 IMPLANT
GLOVE BIOGEL PI IND STRL 8 (GLOVE) ×1 IMPLANT
GLOVE BIOGEL PI INDICATOR 6.5 (GLOVE) ×1
GLOVE BIOGEL PI INDICATOR 8 (GLOVE) ×1
GLOVE SURG SS PI 6.5 STRL IVOR (GLOVE) ×2 IMPLANT
GOWN STRL REUS W/TWL LRG LVL3 (GOWN DISPOSABLE) ×4 IMPLANT
GOWN STRL REUS W/TWL XL LVL3 (GOWN DISPOSABLE) ×2 IMPLANT
HANDPIECE INTERPULSE COAX TIP (DISPOSABLE) ×1
HOLDER FOLEY CATH W/STRAP (MISCELLANEOUS) ×2 IMPLANT
IMMOBILIZER KNEE 20 (SOFTGOODS) ×2
IMMOBILIZER KNEE 20 THIGH 36 (SOFTGOODS) ×1 IMPLANT
INSERT KNEE ATTUNE SZ6 14MM (Insert) ×2 IMPLANT
MANIFOLD NEPTUNE II (INSTRUMENTS) ×2 IMPLANT
PACK TOTAL KNEE CUSTOM (KITS) ×2 IMPLANT
PADDING CAST COTTON 6X4 STRL (CAST SUPPLIES) ×6 IMPLANT
PIN STEINMAN FIXATION KNEE (PIN) ×2 IMPLANT
PIN THREADED HEADED SIGMA (PIN) ×2 IMPLANT
PROTECTOR NERVE ULNAR (MISCELLANEOUS) ×2 IMPLANT
SET HNDPC FAN SPRY TIP SCT (DISPOSABLE) ×1 IMPLANT
STRIP CLOSURE SKIN 1/2X4 (GAUZE/BANDAGES/DRESSINGS) ×4 IMPLANT
SUT MNCRL AB 4-0 PS2 18 (SUTURE) ×2 IMPLANT
SUT STRATAFIX 0 PDS 27 VIOLET (SUTURE) ×2
SUT VIC AB 2-0 CT1 27 (SUTURE) ×3
SUT VIC AB 2-0 CT1 TAPERPNT 27 (SUTURE) ×3 IMPLANT
SUTURE STRATFX 0 PDS 27 VIOLET (SUTURE) ×1 IMPLANT
SYRINGE 60CC LL (MISCELLANEOUS) ×2 IMPLANT
TIBIAL BASE ROT PLAT SZ 5 KNEE (Knees) ×2 IMPLANT
TRAY FOLEY CATH 14FRSI W/METER (CATHETERS) ×2 IMPLANT
WATER STERILE IRR 1000ML POUR (IV SOLUTION) ×4 IMPLANT
WRAP KNEE MAXI GEL POST OP (GAUZE/BANDAGES/DRESSINGS) ×2 IMPLANT
YANKAUER SUCT BULB TIP 10FT TU (MISCELLANEOUS) ×2 IMPLANT

## 2018-07-10 NOTE — Interval H&P Note (Signed)
History and Physical Interval Note:  07/10/2018 7:00 AM  Melissa [name]  has presented today for surgery, with the diagnosis of left knee osteoarthritis  The various methods of treatment have been discussed with the patient and family. After consideration of risks, benefits and other options for treatment, the patient has consented to  Procedure(s) with comments: LEFT TOTAL KNEE ARTHROPLASTY (Left) - 33min as a surgical intervention .  The patient's history has been reviewed, patient examined, no change in status, stable for surgery.  I have reviewed the patient's chart and labs.  Questions were answered to the patient's satisfaction.     Pilar Plate Kendrah Lovern

## 2018-07-10 NOTE — Op Note (Signed)
OPERATIVE REPORT-TOTAL KNEE ARTHROPLASTY   Pre-operative diagnosis- Osteoarthritis  Left knee(s)  Post-operative diagnosis- Osteoarthritis Left knee(s)  Procedure-  Left  Total Knee Arthroplasty  Surgeon- Dione Plover. Tynesia Harral, MD  Assistant- Ardeen Jourdain, PA-C   Anesthesia-  GA combined with regional for post-op pain  EBL- 25 ml   Drains Hemovac  Tourniquet time-  Total Tourniquet Time Documented: Thigh (Left) - 36 minutes Total: Thigh (Left) - 36 minutes     Complications- None  Condition-PACU - hemodynamically stable.   Brief Clinical Note  ZAYA KESSENICH is a 67 y.o. year old female with end stage OA of her left knee with progressively worsening pain and dysfunction. She has constant pain, with activity and at rest and significant functional deficits with difficulties even with ADLs. She has had extensive non-op management including analgesics, injections of cortisone and viscosupplements, and home exercise program, but remains in significant pain with significant dysfunction. Radiographs show bone on bone arthritis medial and patellofemoral with varus dformity. She presents now for left Total Knee Arthroplasty.    Procedure in detail---   The patient is brought into the operating room and positioned supine on the operating table. After successful administration of  GA combined with regional for post-op pain,   a tourniquet is placed high on the  Left thigh(s) and the lower extremity is prepped and draped in the usual sterile fashion. Time out is performed by the operating team and then the  Left lower extremity is wrapped in Esmarch, knee flexed and the tourniquet inflated to 300 mmHg.       A midline incision is made with a ten blade through the subcutaneous tissue to the level of the extensor mechanism. A fresh blade is used to make a medial parapatellar arthrotomy. Soft tissue over the proximal medial tibia is subperiosteally elevated to the joint line with a knife and  into the semimembranosus bursa with a Cobb elevator. Soft tissue over the proximal lateral tibia is elevated with attention being paid to avoiding the patellar tendon on the tibial tubercle. The patella is everted, knee flexed 90 degrees and the ACL and PCL are removed. Findings are bone on bone medial and patellofemoral with large global osteophytes        The drill is used to create a starting hole in the distal femur and the canal is thoroughly irrigated with sterile saline to remove the fatty contents. The 5 degree Left  valgus alignment guide is placed into the femoral canal and the distal femoral cutting block is pinned to remove 9 mm off the distal femur. Resection is made with an oscillating saw.      The tibia is subluxed forward and the menisci are removed. The extramedullary alignment guide is placed referencing proximally at the medial aspect of the tibial tubercle and distally along the second metatarsal axis and tibial crest. The block is pinned to remove 15mm off the more deficient medial  side. Resection is made with an oscillating saw. Size 5is the most appropriate size for the tibia and the proximal tibia is prepared with the modular drill and keel punch for that size.      The femoral sizing guide is placed and size 6 is most appropriate. Rotation is marked off the epicondylar axis and confirmed by creating a rectangular flexion gap at 90 degrees. The size 6 cutting block is pinned in this rotation and the anterior, posterior and chamfer cuts are made with the oscillating saw. The intercondylar block is  then placed and that cut is made.      Trial size 5 tibial component, trial size 6 narrow posterior stabilized femur and a 14  mm posterior stabilized rotating platform insert trial is placed. Full extension is achieved with excellent varus/valgus and anterior/posterior balance throughout full range of motion. The patella is everted and thickness measured to be 22  mm. Free hand resection is  taken to 12 mm, a 38 template is placed, lug holes are drilled, trial patella is placed, and it tracks normally. Osteophytes are removed off the posterior femur with the trial in place. All trials are removed and the cut bone surfaces prepared with pulsatile lavage. Cement is mixed and once ready for implantation, the size 5 tibial implant, size  6 narrow posterior stabilized femoral component, and the size 38 patella are cemented in place and the patella is held with the clamp. The trial insert is placed and the knee held in full extension. The Exparel (20 ml mixed with 60 ml saline) is injected into the extensor mechanism, posterior capsule, medial and lateral gutters and subcutaneous tissues.  All extruded cement is removed and once the cement is hard the permanent 14 mm posterior stabilized rotating platform insert is placed into the tibial tray.      The wound is copiously irrigated with saline solution and the extensor mechanism closed over a hemovac drain with #1 V-loc suture. The tourniquet is released for a total tourniquet time of 36  minutes. Flexion against gravity is 140 degrees and the patella tracks normally. Subcutaneous tissue is closed with 2.0 vicryl and subcuticular with running 4.0 Monocryl. The incision is cleaned and dried and steri-strips and a bulky sterile dressing are applied. The limb is placed into a knee immobilizer and the patient is awakened and transported to recovery in stable condition.      Please note that a surgical assistant was a medical necessity for this procedure in order to perform it in a safe and expeditious manner. Surgical assistant was necessary to retract the ligaments and vital neurovascular structures to prevent injury to them and also necessary for proper positioning of the limb to allow for anatomic placement of the prosthesis.   Dione Plover Capri Veals, MD    07/10/2018, 9:41 AM

## 2018-07-10 NOTE — Anesthesia Postprocedure Evaluation (Signed)
Anesthesia Post Note  Patient: Melissa [name]  Procedure(s) Performed: LEFT TOTAL KNEE ARTHROPLASTY (Left Knee)     Patient location during evaluation: PACU Anesthesia Type: General Level of consciousness: awake and alert Pain management: pain level controlled Vital Signs Assessment: post-procedure vital signs reviewed and stable Respiratory status: spontaneous breathing, nonlabored ventilation and respiratory function stable Cardiovascular status: blood pressure returned to baseline and stable Postop Assessment: no apparent nausea or vomiting Anesthetic complications: no    Last Vitals:  Vitals:   07/10/18 1100 07/10/18 1115  BP: 116/80 116/82  Pulse: (!) 59 (!) 53  Resp: 12 13  Temp:    SpO2: 100% 99%    Last Pain:  Vitals:   07/10/18 1115  TempSrc:   PainSc: [place]

## 2018-07-10 NOTE — Anesthesia Procedure Notes (Signed)
Procedure Name: Intubation Date/Time: 07/10/2018 8:44 AM Performed by: [place] Caulin Begley C, CRNA Pre-anesthesia Checklist: Patient identified, Emergency Drugs available, Suction available and Patient being monitored Patient Re-evaluated:Patient Re-evaluated prior to induction Oxygen Delivery Method: Circle system utilized Preoxygenation: Pre-oxygenation with 100% oxygen Induction Type: IV induction Ventilation: Mask ventilation without difficulty Laryngoscope Size: Mac and 3 Grade View: Grade I Tube type: Oral Tube size: 7.0 mm Number of attempts: 1 Airway Equipment and Method: Stylet and Oral airway Placement Confirmation: ETT inserted through vocal cords under direct vision,  positive ETCO2 and breath sounds checked- equal and bilateral Secured at: 21 cm Tube secured with: Tape Dental Injury: Teeth and Oropharynx as per pre-operative assessment

## 2018-07-10 NOTE — Anesthesia Procedure Notes (Signed)
Anesthesia Regional Block: Adductor canal block   Pre-Anesthetic Checklist: ,, timeout performed, Correct Patient, Correct Site, Correct Laterality, Correct Procedure, Correct Position, site marked, Risks and benefits discussed,  Surgical consent,  Pre-op evaluation,  At surgeon's request and post-op pain management  Laterality: Left  Prep: chloraprep       Needles:  Injection technique: Single-shot  Needle Type: Echogenic Needle     Needle Length: 10cm  Needle Gauge: 21     Additional Needles:   Narrative:  Start time: 07/10/2018 8:12 AM End time: 07/10/2018 8:15 AM Injection made incrementally with aspirations every 5 mL.  Performed by: Personally  Anesthesiologist: Audry Pili, MD  Additional Notes: No pain on injection. No increased resistance to injection. Injection made in 5cc increments. Good needle visualization. Patient tolerated the procedure well.

## 2018-07-10 NOTE — Addendum Note (Signed)
Addendum  created 07/10/18 1142 by [place] Catharina Pica C, CRNA   Charge Capture section accepted

## 2018-07-10 NOTE — Anesthesia Procedure Notes (Signed)
Spinal  Patient location during procedure: OR Start time: 07/10/2018 8:30 AM End time: 07/10/2018 8:42 AM Staffing Anesthesiologist: Audry Pili, MD Performed: anesthesiologist  Preanesthetic Checklist Completed: patient identified, surgical consent, pre-op evaluation, timeout performed, IV checked, risks and benefits discussed and monitors and equipment checked Spinal Block Patient position: sitting Prep: DuraPrep Patient monitoring: heart rate, cardiac monitor, continuous pulse ox and blood pressure Approach: midline Location: L3-4 Injection technique: single-shot Needle Needle type: Quincke  Needle gauge: 22 G Additional Notes Consent was obtained prior to the procedure with all questions answered and concerns addressed. Risks including, but not limited to, bleeding, infection, nerve damage, paralysis, failed block, inadequate analgesia, allergic reaction, high spinal, itching, and headache were discussed and the patient wished to proceed. Functioning IV was confirmed and monitors were applied. Sterile prep and drape, including hand hygiene, mask, and sterile gloves were used. The patient was positioned and the spine was prepped. The skin was anesthetized with lidocaine. First attempt by CRNA with 24ga Pencan unsuccessful. 2nd attempt by Dr. Fransisco Beau with 22ga Quincke also unsuccessful. Decision made to abort procedure and proceed with general anesthesia.  Renold Don, MD

## 2018-07-10 NOTE — Discharge Instructions (Addendum)
° °Dr. Frank Aluisio °Total Joint Specialist °[facility] °[address] °[place], [place] [address] °[phone] ° °TOTAL KNEE REPLACEMENT POSTOPERATIVE DIRECTIONS ° °Knee Rehabilitation, Guidelines Following Surgery  °Results after knee surgery are often greatly improved when you follow the exercise, range of motion and muscle strengthening exercises prescribed by your doctor. Safety measures are also important to protect the knee from further injury. Any time any of these exercises cause you to have increased pain or swelling in your knee joint, decrease the amount until you are comfortable again and slowly increase them. If you have problems or questions, call your caregiver or physical therapist for advice.  ° °HOME CARE INSTRUCTIONS  °Remove items at home which could result in a fall. This includes throw rugs or furniture in walking pathways.  °· ICE to the affected knee every three hours for 30 minutes at a time and then as needed for pain and swelling.  Continue to use ice on the knee for pain and swelling from surgery. You may notice swelling that will progress down to the foot and ankle.  This is normal after surgery.  Elevate the leg when you are not up walking on it.   °· Continue to use the breathing machine which will help keep your temperature down.  It is common for your temperature to cycle up and down following surgery, especially at night when you are not up moving around and exerting yourself.  The breathing machine keeps your lungs expanded and your temperature down. °· Do not place pillow under knee, focus on keeping the knee straight while resting ° °DIET °You may resume your previous home diet once your are discharged from the hospital. ° °DRESSING / WOUND CARE / SHOWERING °You may shower 3 days after surgery, but keep the wounds dry during showering.  You may use an occlusive plastic wrap (Press'n Seal for example), NO SOAKING/SUBMERGING IN THE BATHTUB.  If the bandage gets  wet, change with a clean dry gauze.  If the incision gets wet, pat the wound dry with a clean towel. °You may start showering once you are discharged home but do not submerge the incision under water. Just pat the incision dry and apply a dry gauze dressing on daily. °Change the surgical dressing daily and reapply a dry dressing each time. ° °ACTIVITY °Walk with your walker as instructed. °Use walker as long as suggested by your caregivers. °Avoid periods of inactivity such as sitting longer than an hour when not asleep. This helps prevent blood clots.  °You may resume a sexual relationship in one month or when given the OK by your doctor.  °You may return to work once you are cleared by your doctor.  °Do not drive a car for 6 weeks or until released by you surgeon.  °Do not drive while taking narcotics. ° °WEIGHT BEARING °Weight bearing as tolerated with assist device (walker, cane, etc) as directed, use it as long as suggested by your surgeon or therapist, typically at least 4-6 weeks. ° °POSTOPERATIVE CONSTIPATION PROTOCOL °Constipation - defined medically as fewer than three stools per week and severe constipation as less than one stool per week. ° °One of the most common issues patients have following surgery is constipation.  Even if you have a regular bowel pattern at home, your normal regimen is likely to be disrupted due to multiple reasons following surgery.  Combination of anesthesia, postoperative narcotics, change in appetite and fluid intake all can affect your bowels.    In order to avoid complications following surgery, here are some recommendations in order to help you during your recovery period. ° °Colace (docusate) - Pick up an over-the-counter form of Colace or another stool softener and take twice a day as long as you are requiring postoperative pain medications.  Take with a full glass of water daily.  If you experience loose stools or diarrhea, hold the colace until you stool forms back up.  If  your symptoms do not get better within 1 week or if they get worse, check with your doctor. ° °Dulcolax (bisacodyl) - Pick up over-the-counter and take as directed by the product packaging as needed to assist with the movement of your bowels.  Take with a full glass of water.  Use this product as needed if not relieved by Colace only.  ° °MiraLax (polyethylene glycol) - Pick up over-the-counter to have on hand.  MiraLax is a solution that will increase the amount of water in your bowels to assist with bowel movements.  Take as directed and can mix with a glass of water, juice, soda, coffee, or tea.  Take if you go more than two days without a movement. °Do not use MiraLax more than once per day. Call your doctor if you are still constipated or irregular after using this medication for 7 days in a row. ° °If you continue to have problems with postoperative constipation, please contact the office for further assistance and recommendations.  If you experience "the worst abdominal pain ever" or develop nausea or vomiting, please contact the office immediatly for further recommendations for treatment. ° °ITCHING ° If you experience itching with your medications, try taking only a single pain pill, or even half a pain pill at a time.  You can also use Benadryl over the counter for itching or also to help with sleep.  ° °TED HOSE STOCKINGS °Wear the elastic stockings on both legs for three weeks following surgery during the day but you may remove then at night for sleeping. ° °MEDICATIONS °See your medication summary on the “After Visit Summary” that the nursing staff will review with you prior to discharge.  You may have some home medications which will be placed on hold until you complete the course of blood thinner medication.  It is important for you to complete the blood thinner medication as prescribed by your surgeon.  Continue your approved medications as instructed at time of discharge. ° °PRECAUTIONS °If you  experience chest pain or shortness of breath - call 911 immediately for transfer to the hospital emergency department.  °If you develop a fever greater that 101 F, purulent drainage from wound, increased redness or drainage from wound, foul odor from the wound/dressing, or calf pain - CONTACT YOUR SURGEON.   °                                                °FOLLOW-UP APPOINTMENTS °Make sure you keep all of your appointments after your operation with your surgeon and caregivers. You should call the office at the above phone number and make an appointment for approximately two weeks after the date of your surgery or on the date instructed by your surgeon outlined in the "After Visit Summary". ° ° °RANGE OF MOTION AND STRENGTHENING EXERCISES  °Rehabilitation of the knee is important following a knee injury or   an operation. After just a few days of immobilization, the muscles of the thigh which control the knee become weakened and shrink (atrophy). Knee exercises are designed to build up the tone and strength of the thigh muscles and to improve knee motion. Often times heat used for twenty to thirty minutes before working out will loosen up your tissues and help with improving the range of motion but do not use heat for the first two weeks following surgery. These exercises can be done on a training (exercise) mat, on the floor, on a table or on a bed. Use what ever works the best and is most comfortable for you Knee exercises include:  °Leg Lifts - While your knee is still immobilized in a splint or cast, you can do straight leg raises. Lift the leg to 60 degrees, hold for 3 sec, and slowly lower the leg. Repeat 10-20 times 2-3 times daily. Perform this exercise against resistance later as your knee gets better.  °Quad and Hamstring Sets - Tighten up the muscle on the front of the thigh (Quad) and hold for 5-10 sec. Repeat this 10-20 times hourly. Hamstring sets are done by pushing the foot backward against an object  and holding for 5-10 sec. Repeat as with quad sets.  °· Leg Slides: Lying on your back, slowly slide your foot toward your buttocks, bending your knee up off the floor (only go as far as is comfortable). Then slowly slide your foot back down until your leg is flat on the floor again. °· Angel Wings: Lying on your back spread your legs to the side as far apart as you can without causing discomfort.  °A rehabilitation program following serious knee injuries can speed recovery and prevent re-injury in the future due to weakened muscles. Contact your doctor or a physical therapist for more information on knee rehabilitation.  ° °IF YOU ARE TRANSFERRED TO A SKILLED REHAB FACILITY °If the patient is transferred to a skilled rehab facility following release from the hospital, a list of the current medications will be sent to the facility for the patient to continue.  When discharged from the skilled rehab facility, please have the facility set up the patient's Home Health Physical Therapy prior to being released. Also, the skilled facility will be responsible for providing the patient with their medications at time of release from the facility to include their pain medication, the muscle relaxants, and their blood thinner medication. If the patient is still at the rehab facility at time of the two week follow up appointment, the skilled rehab facility will also need to assist the patient in arranging follow up appointment in our office and any transportation needs. ° °MAKE SURE YOU:  °Understand these instructions.  °Get help right away if you are not doing well or get worse.  ° ° °Pick up stool softner and laxative for home use following surgery while on pain medications. °Do not submerge incision under water. °Please use good hand washing techniques while changing dressing each day. °May shower starting three days after surgery. °Please use a clean towel to pat the incision dry following showers. °Continue to use ice for  pain and swelling after surgery. °Do not use any lotions or creams on the incision until instructed by your surgeon. ° °Information on my medicine - XARELTO® (Rivaroxaban) ° ° ° °Why was Xarelto® prescribed for you? °Xarelto® was prescribed for you to reduce the risk of blood clots forming after orthopedic surgery. The medical term for   these abnormal blood clots is venous thromboembolism (VTE). ° °What do you need to know about xarelto® ? °Take your Xarelto® ONCE DAILY at the same time every day. °You may take it either with or without food. ° °If you have difficulty swallowing the tablet whole, you may crush it and mix in applesauce just prior to taking your dose. ° °Take Xarelto® exactly as prescribed by your doctor and DO NOT stop taking Xarelto® without talking to the doctor who prescribed the medication.  Stopping without other VTE prevention medication to take the place of Xarelto® may increase your risk of developing a clot. ° °After discharge, you should have regular check-up appointments with your healthcare provider that is prescribing your Xarelto®.   ° °What do you do if you miss a dose? °If you miss a dose, take it as soon as you remember on the same day then continue your regularly scheduled once daily regimen the next day. Do not take two doses of Xarelto® on the same day.  ° °Important Safety Information °A possible side effect of Xarelto® is bleeding. You should call your healthcare provider right away if you experience any of the following: °? Bleeding from an injury or your nose that does not stop. °? Unusual colored urine (red or dark brown) or unusual colored stools (red or black). °? Unusual bruising for unknown reasons. °? A serious fall or if you hit your head (even if there is no bleeding). ° °Some medicines may interact with Xarelto® and might increase your risk of bleeding while on Xarelto®. To help avoid this, consult your healthcare provider or pharmacist prior to using any new  prescription or non-prescription medications, including herbals, vitamins, non-steroidal anti-inflammatory drugs (NSAIDs) and supplements. ° °This website has more information on Xarelto®: www.xarelto.com. ° ° °

## 2018-07-10 NOTE — Progress Notes (Signed)
AssistedDr. Brock with left, ultrasound guided, adductor canal block. Side rails up, monitors on throughout procedure. See vital signs in flow sheet. Tolerated Procedure well.  

## 2018-07-10 NOTE — Evaluation (Signed)
Physical Therapy Evaluation Patient Details Name: Melissa [name] MRN: [id number] DOB: Dec 14, 1950 Today's Date: 07/10/2018   History of Present Illness  L TKA  Clinical Impression  Pt is s/p TKA resulting in the deficits listed below (see PT Problem List). Pt ambulated 30' with RW, no loss of balance. Initiated TKA HEP. Good progress expected. Pt will benefit from skilled PT to increase their independence and safety with mobility to allow discharge to the venue listed below.      Follow Up Recommendations Outpatient PT;Follow surgeon's recommendation for DC plan and follow-up therapies(outpt scheduled for Fri at [facility]) -pt requesting to do HHPT    Equipment Recommendations  Rolling walker with 5" wheels;3in1 (PT)    Recommendations for Other Services       Precautions / Restrictions Precautions Precautions: Knee Precaution Booklet Issued: Yes (comment) Restrictions Weight Bearing Restrictions: No Other Position/Activity Restrictions: WBAT      Mobility  Bed Mobility Overal bed mobility: Needs Assistance Bed Mobility: Supine to Sit     Supine to sit: Min guard     General bed mobility comments: instructed pt to self assist LLE with RLE  Transfers Overall transfer level: Needs assistance Equipment used: Rolling walker (2 wheeled) Transfers: Sit to/from Stand Sit to Stand: Min guard;From elevated surface         General transfer comment: VCs hand placement  Ambulation/Gait Ambulation/Gait assistance: Min guard Gait Distance (Feet): 30 Feet Assistive device: Rolling walker (2 wheeled) Gait Pattern/deviations: Step-to pattern;Decreased weight shift to left Gait velocity: decr   General Gait Details: no LOB, VCs sequencing  Stairs            Wheelchair Mobility    Modified Rankin (Stroke Patients Only)       Balance Overall balance assessment: Modified Independent                                           Pertinent  Vitals/Pain Pain Assessment: 0-10 Pain Score: 3  Pain Location: L knee Pain Descriptors / Indicators: Sore Pain Intervention(s): Limited activity within patient's tolerance;Monitored during session;Premedicated before session;Ice applied    Home Living Family/patient expects to be discharged to:: Private residence Living Arrangements: Spouse/significant other Available Help at Discharge: Family;Available 24 hours/day   Home Access: Stairs to enter Entrance Stairs-Rails: Left Entrance Stairs-Number of Steps: 5 thru garage, or 1 thru front Home Layout: Two level;Able to live on main level with bedroom/bathroom Home Equipment: Walker - 2 wheels      Prior Function Level of Independence: Independent               Hand Dominance        Extremity/Trunk Assessment   Upper Extremity Assessment Upper Extremity Assessment: Overall WFL for tasks assessed    Lower Extremity Assessment Lower Extremity Assessment: LLE deficits/detail LLE Deficits / Details: SLR 3/5, knee ext -3/5, knee AAROM 10-50*       Communication   Communication: No difficulties  Cognition Arousal/Alertness: Awake/alert Behavior During Therapy: WFL for tasks assessed/performed Overall Cognitive Status: Within Functional Limits for tasks assessed                                        General Comments      Exercises Total Joint Exercises Ankle Circles/Pumps: AROM;Both;10 reps;Supine  Quad Sets: AROM;Both;5 reps;Supine Long Arc Quad: AAROM;AROM;Left;5 reps;Seated   Assessment/Plan    PT Assessment Patient needs continued PT services  PT Problem List Decreased strength;Decreased range of motion;Decreased activity tolerance;Decreased balance;Decreased mobility;Pain       PT Treatment Interventions DME instruction;Gait training;Stair training;Functional mobility training;Therapeutic activities;Therapeutic exercise;Patient/family education    PT Goals (Current goals can be found in  the Care Plan section)  Acute Rehab PT Goals Patient Stated Goal: play with grandkids, shop, cook PT Goal Formulation: With patient/family Time For Goal Achievement: 07/17/18 Potential to Achieve Goals: Good    Frequency 7X/week   Barriers to discharge        Co-evaluation               AM-PAC PT "6 Clicks" Mobility  Outcome Measure Help needed turning from your back to your side while in a flat bed without using bedrails?: A Little Help needed moving from lying on your back to sitting on the side of a flat bed without using bedrails?: A Little Help needed moving to and from a bed to a chair (including a wheelchair)?: A Little Help needed standing up from a chair using your arms (e.g., wheelchair or bedside chair)?: A Little Help needed to walk in hospital room?: A Little Help needed climbing 3-5 steps with a railing? : A Lot 6 Click Score: 17    End of Session Equipment Utilized During Treatment: Gait belt Activity Tolerance: Patient tolerated treatment well Patient left: in chair;with call bell/phone within reach;with family/visitor present Nurse Communication: Mobility status PT Visit Diagnosis: Difficulty in walking, not elsewhere classified (R26.2);Pain Pain - Right/Left: Left Pain - part of body: Knee    Time: 0814-4818 PT Time Calculation (min) (ACUTE ONLY): 31 min   Charges:   PT Evaluation $PT Eval Low Complexity: 1 Low PT Treatments $Gait Training: 8-22 mins        Blondell Reveal Kistler PT 07/10/2018  Acute Rehabilitation Services Pager [phone] Office [phone]

## 2018-07-10 NOTE — Transfer of Care (Signed)
Immediate Anesthesia Transfer of Care Note  Patient: Melissa [name]  Procedure(s) Performed: LEFT TOTAL KNEE ARTHROPLASTY (Left Knee)  Patient Location: PACU  Anesthesia Type:General  Level of Consciousness: awake, alert  and oriented  Airway & Oxygen Therapy: Patient Spontanous Breathing and Patient connected to face mask oxygen  Post-op Assessment: Report given to RN and Post -op Vital signs reviewed and stable  Post vital signs: Reviewed and stable  Last Vitals:  Vitals Value Taken Time  BP 117/84 07/10/2018 10:03 AM  Temp    Pulse 71 07/10/2018 10:04 AM  Resp 12 07/10/2018 10:04 AM  SpO2 95 % 07/10/2018 10:04 AM  Vitals shown include unvalidated device data.  Last Pain:  Vitals:   07/10/18 0819  TempSrc:   PainSc: 0-No pain      Patients Stated Pain Goal: 5 (29/56/21 3086)  Complications: No apparent anesthesia complications

## 2018-07-11 ENCOUNTER — Encounter (HOSPITAL_COMMUNITY): Payer: Self-pay | Admitting: Orthopedic Surgery

## 2018-07-11 LAB — BASIC METABOLIC PANEL
Anion gap: 4 — ABNORMAL LOW (ref 5–15)
BUN: 12 mg/dL (ref 8–23)
CO2: 24 mmol/L (ref 22–32)
Calcium: 8.5 mg/dL — ABNORMAL LOW (ref 8.9–10.3)
Chloride: 109 mmol/L (ref 98–111)
Creatinine, Ser: 0.73 mg/dL (ref 0.44–1.00)
GFR calc Af Amer: >60 mL/min (ref >60)
GFR calc non Af Amer: >60 mL/min (ref >60)
Glucose, Bld: 181 mg/dL — ABNORMAL HIGH (ref 70–99)
POTASSIUM: 4.2 mmol/L (ref 3.5–5.1)
Sodium: 137 mmol/L (ref 135–145)

## 2018-07-11 LAB — CBC
HCT: 33.9 % — ABNORMAL LOW (ref 36.0–46.0)
Hemoglobin: 10.7 g/dL — ABNORMAL LOW (ref 12.0–15.0)
MCH: 27.1 pg (ref 26.0–34.0)
MCHC: 31.6 g/dL (ref 30.0–36.0)
MCV: 85.8 fL (ref 80.0–100.0)
PLATELETS: 205 10*3/uL (ref 150–400)
RBC: 3.95 MIL/uL (ref 3.87–5.11)
RDW: 14.9 % (ref 11.5–15.5)
WBC: 7.7 10*3/uL (ref 4.0–10.5)
nRBC: 0 % (ref 0.0–0.2)

## 2018-07-11 MED ORDER — METHOCARBAMOL 500 MG PO TABS: 500.0000 mg | ORAL_TABLET | Freq: Four times a day (QID) | ORAL | 0 refills | Status: DC | PRN

## 2018-07-11 MED ORDER — OXYCODONE HCL 5 MG PO TABS: 5.0000 mg | ORAL_TABLET | Freq: Four times a day (QID) | ORAL | 0 refills | Status: DC | PRN

## 2018-07-11 MED ORDER — RIVAROXABAN 10 MG PO TABS: 10.0000 mg | ORAL_TABLET | Freq: Every day | ORAL | 0 refills | Status: DC

## 2018-07-11 MED ORDER — GABAPENTIN 300 MG PO CAPS: 300.0000 mg | ORAL_CAPSULE | Freq: Three times a day (TID) | ORAL | 0 refills | Status: DC

## 2018-07-11 MED ORDER — TRAMADOL HCL 50 MG PO TABS: 50.0000 mg | ORAL_TABLET | Freq: Four times a day (QID) | ORAL | 0 refills | Status: DC | PRN

## 2018-07-11 MED ORDER — SODIUM CHLORIDE 0.9 % IV BOLUS
250.0000 mL | Freq: Once | INTRAVENOUS | Status: AC
  Administered 2018-07-11: 250 mL via INTRAVENOUS

## 2018-07-11 MED FILL — METHOCARBAMOL 500 MG TABS: 500 | 10 days supply | Qty: 40 | Fill #0

## 2018-07-11 MED FILL — XARELTO 10 MG TABLET: 10 | 20 days supply | Qty: 20 | Fill #0

## 2018-07-11 MED FILL — GABAPENTIN 300 MG CAPSULE: 300 | 42 days supply | Qty: 84 | Fill #0

## 2018-07-11 MED FILL — traMADol HCL 50 MG TABS: 50 | 5 days supply | Qty: 40 | Fill #0

## 2018-07-11 MED FILL — oxyCODONE HCL 5 MG TABS: 5 | 7 days supply | Qty: 56 | Fill #0

## 2018-07-11 NOTE — Progress Notes (Signed)
Subjective: 1 Day Post-Op Procedure(s) (LRB): LEFT TOTAL KNEE ARTHROPLASTY (Left) Patient reports pain as mild.   Patient seen in rounds by Dr. Wynelle Link. Patient is well, and has had no acute complaints or problems. Foley catheter removed this AM. Denies chest pain, SOB, or calf pain. No issues overnight. We will continue therapy today.   Objective: Vital signs in last 24 hours: Temp:  [97.6 F (36.4 C)-99.2 F (37.3 C)] 98.4 F (36.9 C) (12/17 0524) Pulse Rate:  [53-71] 66 (12/17 0524) Resp:  [9-21] 17 (12/17 0524) BP: (97-123)/(63-86) 105/68 (12/17 0524) SpO2:  [94 %-100 %] 98 % (12/17 0524)  Intake/Output from previous day:  Intake/Output Summary (Last 24 hours) at 07/11/2018 0811 Last data filed at 07/11/2018 0550 Gross per 24 hour  Intake 3771.25 ml  Output 1955 ml  Net 1816.25 ml    Labs: Recent Labs    07/11/18 0503  HGB 10.7*   Recent Labs    07/11/18 0503  WBC 7.7  RBC 3.95  HCT 33.9*  PLT 205   Recent Labs    07/11/18 0503  NA 137  K 4.2  CL 109  CO2 24  BUN 12  CREATININE 0.73  GLUCOSE 181*  CALCIUM 8.5*   Exam: General - Patient is Alert and Oriented Extremity - Neurologically intact Neurovascular intact Sensation intact distally Dorsiflexion/Plantar flexion intact Dressing - dressing C/D/I Motor Function - intact, moving foot and toes well on exam.   Past Medical History:  Diagnosis Date  . Anemia   . AR (aortic regurgitation) 06/2015   Trivial, Noted on ECHO  . Breast cancer ([place]) 07/23/97   left, tx w/xrt, Tamoxifen x 5 yrs  . Chronic pain    left knee  . DDD (degenerative disc disease), lumbar 04/05/2018   xray  . Diastolic dysfunction without heart failure 09/01/2015   Grade 1 diastolic dysfunction on echo 06/2015  . Diverticulitis    pt unaware  . DVT complicating pregnancy    Right calf, surface  . History of bladder infections   . History of endometriosis   . Hx of radiation therapy 08/22/97 - 10/03/97   left breast   . MR (mitral regurgitation) 06/2015   Noted on ECHO  . OA (osteoarthritis)    Bilateral Knees  . Obesity   . Personal history of radiation therapy   . Recurrent breast cancer ([place]) 01/04/12   biopsy, ER/PR+, Her 2 -  . Shortness of breath 06/26/2015, 09/07/2016  . TR (tricuspid regurgitation) 06/2015   Noted on ECHO    Assessment/Plan: 1 Day Post-Op Procedure(s) (LRB): LEFT TOTAL KNEE ARTHROPLASTY (Left) Principal Problem:   OA (osteoarthritis) of knee  Estimated body mass index is 36.64 kg/m as calculated from the following:   Height as of this encounter: 5\' 6"  (1.676 m).   Weight as of this encounter: 103 kg. Advance diet Up with therapy D/C IV fluids  Anticipated LOS equal to or greater than 2 midnights due to - Age 66 and older with one or more of the following:  - Obesity  - Expected need for hospital services (PT, OT, Nursing) required for safe  discharge  - Anticipated need for postoperative skilled nursing care or inpatient rehab  - Active co-morbidities: Diabetes OR   - Unanticipated findings during/Post Surgery: None  - Patient is a high risk of re-admission due to: None    DVT Prophylaxis - Xarelto Weight bearing as tolerated. D/C O2 and pulse ox and try on room air. Hemovac  pulled without difficulty, will continue therapy today.  BP soft this AM, 250 mL bolus ordered.  Plan is to go Home after hospital stay. Plan for discharge this afternoon after two sessions of therapy as long as meeting goals. Scheduled for outpatient physical therapy at [facility]. Follow-up in the office in 2 weeks with Dr. Wynelle Link.  Theresa Duty, PA-C Orthopedic Surgery 07/11/2018, 8:11 AM

## 2018-07-11 NOTE — Plan of Care (Signed)
  Problem: Activity: Goal: Range of joint motion will improve Outcome: Progressing   Problem: Clinical Measurements: Goal: Postoperative complications will be avoided or minimized Outcome: Progressing   Problem: Pain Management: Goal: Pain level will decrease with appropriate interventions Outcome: Progressing   

## 2018-07-11 NOTE — Care Management Note (Signed)
Case Management Note  Patient Details  Name: Melissa [name] MRN: [id number] Date of Birth: [birth year]  Subjective/Objective:           Spoke with patient at bedside. Confirmed plan for OP PT, already arranged. Needs a RW and 3n1. [phone]         Action/Plan: [employer] delivered DME to room.   Expected Discharge Date:  07/11/18               Expected Discharge Plan:  OP Rehab  In-House Referral:  NA  Discharge planning Services  CM Consult, NA  Post Acute Care Choice:  NA Choice offered to:  Patient  DME Arranged:  N/A DME Agency:  NA  HH Arranged:  NA HH Agency:  NA  Status of Service:  Completed, signed off  If discussed at [place] of Stay Meetings, dates discussed:    Additional Comments:  Guadalupe Maple, RN 07/11/2018, 12:16 PM

## 2018-07-11 NOTE — Progress Notes (Signed)
Physical Therapy Treatment Patient Details Name: Melissa [name] MRN: [id number] DOB: 1951-04-08 Today's Date: 07/11/2018    History of Present Illness L TKA    PT Comments    Pt requested 1 more session to review technique for stairs. She ascended/descended 5 stairs with 1 rail and a crutch, with husband present assisting. She demonstrates good understanding of technique and is ready to DC home from PT standpoint.    Follow Up Recommendations  Outpatient PT;Follow surgeon's recommendation for DC plan and follow-up therapies(outpt scheduled for Fri at [facility])     Equipment Recommendations  Rolling walker with 5" wheels;3in1 (PT);Crutches    Recommendations for Other Services       Precautions / Restrictions Precautions Precautions: Knee Precaution Booklet Issued: Yes (comment) Precaution Comments: reviewed no pillow under knee Restrictions Weight Bearing Restrictions: No Other Position/Activity Restrictions: WBAT    Mobility  Bed Mobility Overal bed mobility: Needs Assistance Bed Mobility: Supine to Sit     Supine to sit: Supervision;HOB elevated     General bed mobility comments: up in recliner  Transfers Overall transfer level: Needs assistance Equipment used: Rolling walker (2 wheeled) Transfers: Sit to/from Stand Sit to Stand: From elevated surface;Supervision         General transfer comment: VCs hand placement  Ambulation/Gait Ambulation/Gait assistance: Supervision Gait Distance (Feet): 30 Feet Assistive device: Rolling walker (2 wheeled) Gait Pattern/deviations: Step-to pattern;Decreased weight shift to left Gait velocity: decr   General Gait Details: no LOB, good sequencing   Stairs Stairs: Yes Stairs assistance: Min guard Stair Management: One rail Left;Forwards;Step to pattern;With crutches Number of Stairs: 5 General stair comments: VCs sequencing, husband present;    Wheelchair Mobility    Modified Rankin (Stroke Patients  Only)       Balance Overall balance assessment: Modified Independent                                          Cognition Arousal/Alertness: Awake/alert Behavior During Therapy: WFL for tasks assessed/performed Overall Cognitive Status: Within Functional Limits for tasks assessed                                        Exercises    General Comments        Pertinent Vitals/Pain Pain Score: 5  Pain Location: L knee Pain Descriptors / Indicators: Sore Pain Intervention(s): Limited activity within patient's tolerance;Monitored during session;Premedicated before session;Ice applied    Home Living                      Prior Function            PT Goals (current goals can now be found in the care plan section) Acute Rehab PT Goals Patient Stated Goal: play with grandkids, shop, cook PT Goal Formulation: With patient/family Time For Goal Achievement: 07/17/18 Potential to Achieve Goals: Good Progress towards PT goals: Goals met/education completed, patient discharged from PT    Frequency    7X/week      PT Plan Current plan remains appropriate    Co-evaluation              AM-PAC PT "6 Clicks" Mobility   Outcome Measure  Help needed turning from your back to your side while in a flat bed  without using bedrails?: None Help needed moving from lying on your back to sitting on the side of a flat bed without using bedrails?: None Help needed moving to and from a bed to a chair (including a wheelchair)?: None Help needed standing up from a chair using your arms (e.g., wheelchair or bedside chair)?: None Help needed to walk in hospital room?: None Help needed climbing 3-5 steps with a railing? : A Little 6 Click Score: 23    End of Session Equipment Utilized During Treatment: Gait belt Activity Tolerance: Patient tolerated treatment well Patient left: in chair;with call bell/phone within reach;with family/visitor  present Nurse Communication: Mobility status PT Visit Diagnosis: Difficulty in walking, not elsewhere classified (R26.2);Pain Pain - Right/Left: Left Pain - part of body: Knee     Time: 1150-1204 PT Time Calculation (min) (ACUTE ONLY): 14 min  Charges:  $Self Care/Home Management: 8-22                    Blondell Reveal Kistler PT 07/11/2018  Acute Rehabilitation Services Pager [phone] Office [phone]

## 2018-07-11 NOTE — Progress Notes (Signed)
Physical Therapy Treatment Patient Details Name: Melissa [name] MRN: [id number] DOB: [birth year] Today's Date: 07/11/2018    History of Present Illness L TKA    PT Comments    Pt is progressing well with mobility, she ambulated 110' with RW, no loss of balance. Stair training completed.  Pt/spouse demonstrate good understanding of HEP. Pt is ready to DC home from PT standpoint.  Follow Up Recommendations  Outpatient PT;Follow surgeon's recommendation for DC plan and follow-up therapies(outpt scheduled for Fri at [facility])     Equipment Recommendations  Rolling walker with 5" wheels;3in1 (PT);Crutches    Recommendations for Other Services       Precautions / Restrictions Precautions Precautions: Knee Precaution Booklet Issued: Yes (comment) Precaution Comments: reviewed no pillow under knee Restrictions Weight Bearing Restrictions: No Other Position/Activity Restrictions: WBAT    Mobility  Bed Mobility Overal bed mobility: Needs Assistance Bed Mobility: Supine to Sit     Supine to sit: Supervision;HOB elevated     General bed mobility comments: instructed pt to self assist LLE with RLE  Transfers Overall transfer level: Needs assistance Equipment used: Rolling walker (2 wheeled) Transfers: Sit to/from Stand Sit to Stand: From elevated surface;Supervision         General transfer comment: VCs hand placement  Ambulation/Gait Ambulation/Gait assistance: Min guard Gait Distance (Feet): 110 Feet Assistive device: Rolling walker (2 wheeled) Gait Pattern/deviations: Step-to pattern;Decreased weight shift to left Gait velocity: decr   General Gait Details: no LOB, VCs sequencing   Stairs Stairs: Yes Stairs assistance: Min assist Stair Management: One rail Left;Forwards;Step to pattern;With crutches Number of Stairs: 3 General stair comments: VCs sequencing, husband present; also practiced 1 STE backwards with RW x 2 trials   Wheelchair Mobility     Modified Rankin (Stroke Patients Only)       Balance Overall balance assessment: Modified Independent                                          Cognition Arousal/Alertness: Awake/alert Behavior During Therapy: WFL for tasks assessed/performed Overall Cognitive Status: Within Functional Limits for tasks assessed                                        Exercises Total Joint Exercises Ankle Circles/Pumps: AROM;Both;10 reps;Supine Quad Sets: AROM;Both;5 reps;Supine Towel Squeeze: AROM;Both;5 reps;Supine Short Arc Quad: Left;10 reps;Supine;AROM Heel Slides: AAROM;Left;10 reps;Supine Hip ABduction/ADduction: AROM;Left;10 reps;Supine Straight Leg Raises: AROM;Left;10 reps;Supine Long Arc Quad: AAROM;AROM;Left;Seated;10 reps Knee Flexion: AAROM;AROM;Left;10 reps;Seated Goniometric ROM: 10-75* AAROM L knee    General Comments        Pertinent Vitals/Pain Pain Score: 5  Pain Location: L knee Pain Descriptors / Indicators: Sore Pain Intervention(s): Limited activity within patient's tolerance;Monitored during session;Premedicated before session;Ice applied    Home Living                      Prior Function            PT Goals (current goals can now be found in the care plan section) Acute Rehab PT Goals Patient Stated Goal: play with grandkids, shop, cook PT Goal Formulation: With patient/family Time For Goal Achievement: 07/17/18 Potential to Achieve Goals: Good Progress towards PT goals: Progressing toward goals    Frequency    7X/week  PT Plan Current plan remains appropriate    Co-evaluation              AM-PAC PT "6 Clicks" Mobility   Outcome Measure  Help needed turning from your back to your side while in a flat bed without using bedrails?: None Help needed moving from lying on your back to sitting on the side of a flat bed without using bedrails?: None Help needed moving to and from a bed to a  chair (including a wheelchair)?: None Help needed standing up from a chair using your arms (e.g., wheelchair or bedside chair)?: None Help needed to walk in hospital room?: None Help needed climbing 3-5 steps with a railing? : A Little 6 Click Score: 23    End of Session Equipment Utilized During Treatment: Gait belt Activity Tolerance: Patient tolerated treatment well Patient left: in chair;with call bell/phone within reach;with family/visitor present Nurse Communication: Mobility status PT Visit Diagnosis: Difficulty in walking, not elsewhere classified (R26.2);Pain Pain - Right/Left: Left Pain - part of body: Knee     Time: 0917-1004 PT Time Calculation (min) (ACUTE ONLY): 47 min  Charges:  $Gait Training: 8-22 mins $Therapeutic Exercise: 8-22 mins $Therapeutic Activity: 8-22 mins                    Blondell Reveal Kistler PT 07/11/2018  Acute Rehabilitation Services Pager [phone] Office [phone]

## 2018-07-13 ENCOUNTER — Other Ambulatory Visit: Payer: Self-pay | Admitting: *Deleted

## 2018-07-13 NOTE — Patient Outreach (Signed)
Melissa [name] [facility]) Care Management  07/13/2018  Melissa [name] Jan 24, 1951 [id number]  Transition of care telephone call:  Objective:  Melissa [name] was hospitalized at [facility] from 12/16-12/17 for left total knee arthroplasty related to osteoarthritis of the knee. Comorbidities: cancer of the breast- in remission, chronic urinary tract infections, and diastolic dysfunction without heart failure. She was discharged to home on 12/17 without the need for home health services.   Subjective: Initial successful telephone call to patient's preferred number in order to complete transition of care assessment;after verifying 2 HIPAA identifiers and explaining purpose of call, initially spoke with patient's husband, Melissa [name], as patient was asleep. Melissa [name] says she is voiding with out problems, has not had a bowel movement but she is taking Miralax as directed on the discharge instructions. He says they have a 3 in 1, rolling walker and crutches. Spoke with patient and verified her pain is controlled with the prescribed medications and she ambulating with crutches to enter and exit her home because she has 7 entry steps from her garage. She showered this morning using the 3 in 1 and with husband's assistance. She will start outpatient physical therapy tomorrow so suggested she premedicate one hour before therapy session to assist with car transfer and to maximize participation in therapy.  Reviewed with [name] the problems requiring immediate MD notification and she voiced understanding and compliance.   Plan:  No care management needs identified so will close case to [facility] care management services and route successful outreach letter to [place] [facility] clinical pool to be mailed to patient's home address.     Barrington Ellison RN,CCM,CDE [place] Management Coordinator Office Phone  [phone] Office Fax [phone]

## 2018-07-14 DIAGNOSIS — M25562 Pain in left knee: Secondary | ICD-10-CM | POA: Diagnosis not present

## 2018-07-17 DIAGNOSIS — M25562 Pain in left knee: Secondary | ICD-10-CM | POA: Diagnosis not present

## 2018-07-17 NOTE — Discharge Summary (Signed)
Physician Discharge Summary   Patient ID: Melissa [name] MRN: [id number] DOB/AGE: Oct 19, 1950 67 y.o.  Admit date: 07/10/2018 Discharge date: 07/11/2018  Primary Diagnosis: Osteoarthritis, left knee   Admission Diagnoses:  Past Medical History:  Diagnosis Date  . Anemia   . AR (aortic regurgitation) 06/2015   Trivial, Noted on ECHO  . Breast cancer ([place]) 07/23/97   left, tx w/xrt, Tamoxifen x 5 yrs  . Chronic pain    left knee  . DDD (degenerative disc disease), lumbar 04/05/2018   xray  . Diastolic dysfunction without heart failure 09/01/2015   Grade 1 diastolic dysfunction on echo 06/2015  . Diverticulitis    pt unaware  . DVT complicating pregnancy    Right calf, surface  . History of bladder infections   . History of endometriosis   . Hx of radiation therapy 08/22/97 - 10/03/97   left breast  . MR (mitral regurgitation) 06/2015   Noted on ECHO  . OA (osteoarthritis)    Bilateral Knees  . Obesity   . Personal history of radiation therapy   . Recurrent breast cancer ([place]) 01/04/12   biopsy, ER/PR+, Her 2 -  . Shortness of breath 06/26/2015, 09/07/2016  . TR (tricuspid regurgitation) 06/2015   Noted on ECHO   Discharge Diagnoses:   Principal Problem:   OA (osteoarthritis) of knee  Estimated body mass index is 36.64 kg/m as calculated from the following:   Height as of this encounter: 5\' 6"  (1.676 m).   Weight as of this encounter: 103 kg.  Procedure:  Procedure(s) (LRB): LEFT TOTAL KNEE ARTHROPLASTY (Left)   Consults: None  HPI: Melissa [name] is a 67 y.o. year old female with end stage OA of her left knee with progressively worsening pain and dysfunction. She has constant pain, with activity and at rest and significant functional deficits with difficulties even with ADLs. She has had extensive non-op management including analgesics, injections of cortisone and viscosupplements, and home exercise program, but remains in significant pain with significant  dysfunction. Radiographs show bone on bone arthritis medial and patellofemoral with varus dformity. She presents now for left Total Knee Arthroplasty.    Laboratory Data: Admission on 07/10/2018, Discharged on 07/11/2018  Component Date Value Ref Range Status  . WBC 07/11/2018 7.7  4.0 - 10.5 K/uL Final  . RBC 07/11/2018 3.95  3.87 - 5.11 MIL/uL Final  . Hemoglobin 07/11/2018 10.7* 12.0 - 15.0 g/dL Final  . HCT 07/11/2018 33.9* 36.0 - 46.0 % Final  . MCV 07/11/2018 85.8  80.0 - 100.0 fL Final  . MCH 07/11/2018 27.1  26.0 - 34.0 pg Final  . MCHC 07/11/2018 31.6  30.0 - 36.0 g/dL Final  . RDW 07/11/2018 14.9  11.5 - 15.5 % Final  . Platelets 07/11/2018 205  150 - 400 K/uL Final  . nRBC 07/11/2018 0.0  0.0 - 0.2 % Final   Performed at [facility], [place] [address], [place], [place] [address]  . Sodium 07/11/2018 137  135 - 145 mmol/L Final  . Potassium 07/11/2018 4.2  3.5 - 5.1 mmol/L Final  . Chloride 07/11/2018 109  98 - 111 mmol/L Final  . CO2 07/11/2018 24  22 - 32 mmol/L Final  . Glucose, Bld 07/11/2018 181* 70 - 99 mg/dL Final  . BUN 07/11/2018 12  8 - 23 mg/dL Final  . Creatinine, Ser 07/11/2018 0.73  0.44 - 1.00 mg/dL Final  . Calcium 07/11/2018 8.5* 8.9 - 10.3 mg/dL Final  . GFR  calc non Af Amer 07/11/2018 >60  >60 mL/min Final  . GFR calc Af Amer 07/11/2018 >60  >60 mL/min Final  . Anion gap 07/11/2018 4* 5 - 15 Final   Performed at [facility], [place] [address] [place], [place] [address]  Hospital Outpatient Visit on 07/03/2018  Component Date Value Ref Range Status  . aPTT 07/03/2018 30  24 - 36 seconds Final   Performed at [facility], [place] [address] [place], [place] [address]  . WBC 07/03/2018 4.8  4.0 - 10.5 K/uL Final  . RBC 07/03/2018 4.61  3.87 - 5.11 MIL/uL Final  . Hemoglobin 07/03/2018 12.6  12.0 - 15.0 g/dL Final  . HCT 07/03/2018 41.0  36.0 - 46.0 % Final  . MCV 07/03/2018 88.9  80.0 - 100.0 fL Final  .  MCH 07/03/2018 27.3  26.0 - 34.0 pg Final  . MCHC 07/03/2018 30.7  30.0 - 36.0 g/dL Final  . RDW 07/03/2018 15.4  11.5 - 15.5 % Final  . Platelets 07/03/2018 237  150 - 400 K/uL Final  . nRBC 07/03/2018 0.0  0.0 - 0.2 % Final   Performed at [facility], [place] [address], [place], [place] [address]  . Sodium 07/03/2018 141  135 - 145 mmol/L Final  . Potassium 07/03/2018 5.0  3.5 - 5.1 mmol/L Final  . Chloride 07/03/2018 108  98 - 111 mmol/L Final  . CO2 07/03/2018 25  22 - 32 mmol/L Final  . Glucose, Bld 07/03/2018 115* 70 - 99 mg/dL Final  . BUN 07/03/2018 19  8 - 23 mg/dL Final  . Creatinine, Ser 07/03/2018 0.94  0.44 - 1.00 mg/dL Final  . Calcium 07/03/2018 9.3  8.9 - 10.3 mg/dL Final  . Total Protein 07/03/2018 7.1  6.5 - 8.1 g/dL Final  . Albumin 07/03/2018 4.0  3.5 - 5.0 g/dL Final  . AST 07/03/2018 22  15 - 41 U/L Final  . ALT 07/03/2018 19  0 - 44 U/L Final  . Alkaline Phosphatase 07/03/2018 82  38 - 126 U/L Final  . Total Bilirubin 07/03/2018 0.4  0.3 - 1.2 mg/dL Final  . GFR calc non Af Amer 07/03/2018 >60  >60 mL/min Final  . GFR calc Af Amer 07/03/2018 >60  >60 mL/min Final  . Anion gap 07/03/2018 8  5 - 15 Final   Performed at [facility], [place] [address], [place], [place] [address]  . Prothrombin Time 07/03/2018 12.0  11.4 - 15.2 seconds Final  . INR 07/03/2018 0.90   Final   Performed at [facility], [place] [address], [place], [place] [address]  . ABO/RH(D) 07/03/2018 A POS   Final  . Antibody Screen 07/03/2018 NEG   Final  . Sample Expiration 07/03/2018 07/13/2018   Final  . Extend sample reason 07/03/2018    Final                   Value:NO TRANSFUSIONS OR PREGNANCY IN THE PAST 3 MONTHS Performed at [facility], [place] [address], [place], [place] [address]   . MRSA, PCR 07/03/2018 NEGATIVE  NEGATIVE Final  . Staphylococcus aureus 07/03/2018 NEGATIVE  NEGATIVE Final   Comment: (NOTE) The Xpert  SA Assay (FDA approved for NASAL specimens in patients 61 years of age and older), is one component of a comprehensive surveillance program. It is not intended to diagnose infection nor to guide or monitor treatment. Performed at [facility], [place] [address], [place], [place] [address]   . ABO/RH(D) 07/03/2018    Final                   Value:A POS Performed at [facility], [place] [address] [place], [place] [address]      X-Rays:No results found.  EKG: Orders placed or performed in visit on 10/05/17  . EKG 12-Lead     Hospital Course: DIETRICH KE is a 67 y.o. who was admitted to [facility]. They were brought to the operating room on 07/10/2018 and underwent Procedure(s): LEFT TOTAL KNEE ARTHROPLASTY.  Patient tolerated the procedure well and was later transferred to the recovery room and then to the orthopaedic floor for postoperative care. They were given PO and IV analgesics for pain control following their surgery. They were given 24 hours of postoperative antibiotics of  Anti-infectives (From admission, onward)   Start     Dose/Rate Route Frequency Ordered Stop   07/10/18 2200  trimethoprim (TRIMPEX) tablet 100 mg  Status:  Discontinued     100 mg Oral 2 times daily 07/10/18 1240 07/11/18 1613   07/10/18 1500  ceFAZolin (ANCEF) IVPB 2g/100 mL premix     2 g 200 mL/hr over 30 Minutes Intravenous Every 6 hours 07/10/18 1240 07/10/18 2211   07/10/18 0700  ceFAZolin (ANCEF) IVPB 2g/100 mL premix     2 g 200 mL/hr over 30 Minutes Intravenous On call to O.R. 07/10/18 [id number] 07/10/18 0847     and started on DVT prophylaxis in the form of Xarelto.   PT and OT were ordered for total joint protocol. Discharge planning consulted to help with postop disposition and equipment needs.  Patient had a good night on the evening of surgery. They started to get up OOB with therapy on POD #1. Pt was seen during rounds and was ready to go home pending  progress with therapy. BP was noted to be soft, 250 mL bolus was ordered. Hemovac drain was pulled without difficulty. She worked with therapy on POD #1 and was meeting her goals. Pt was discharged to home later that day in stable condition.  Diet: Cardiac diet Activity: WBAT Follow-up: in 2 weeks with Dr. Wynelle Link Disposition: Home with outpatient physical therapy at [facility] Discharged Condition: stable   Discharge Instructions    Call MD / Call 911   Complete by:  As directed    If you experience chest pain or shortness of breath, CALL 911 and be transported to the hospital emergency room.  If you develope a fever above 101 F, pus (white drainage) or increased drainage or redness at the wound, or calf pain, call your surgeon's office.   Change dressing   Complete by:  As directed    Change dressing on Wednesday, then change the dressing daily with sterile 4 x 4 inch gauze dressing and apply TED hose.   Constipation Prevention   Complete by:  As directed    Drink plenty of fluids.  Prune juice may be helpful.  You may use a stool softener, such as Colace (over the counter) 100 mg twice a day.  Use MiraLax (over the counter) for constipation as needed.   Diet - low sodium heart healthy   Complete by:  As directed    Discharge instructions   Complete by:  As directed    Dr. Gaynelle Arabian Total Joint Specialist [facility] [address] [place],  [address] [phone]  TOTAL KNEE REPLACEMENT POSTOPERATIVE DIRECTIONS  Knee Rehabilitation, Guidelines Following Surgery  Results after knee surgery are often greatly improved when you follow the exercise, range of motion and muscle strengthening exercises prescribed by your doctor. Safety measures are also important to protect the knee from further injury. Any time any of these exercises cause you to have increased pain or swelling in your knee joint, decrease the amount until you are comfortable again and slowly  increase them. If you have problems or questions, call your caregiver or physical therapist for advice.   HOME CARE INSTRUCTIONS  Remove items at home which could result in a fall. This includes throw rugs or furniture in walking pathways.  ICE to the affected knee every three hours for 30 minutes at a time and then as needed for pain and swelling.  Continue to use ice on the knee for pain and swelling from surgery. You may notice swelling that will progress down to the foot and ankle.  This is normal after surgery.  Elevate the leg when you are not up walking on it.   Continue to use the breathing machine which will help keep your temperature down.  It is common for your temperature to cycle up and down following surgery, especially at night when you are not up moving around and exerting yourself.  The breathing machine keeps your lungs expanded and your temperature down. Do not place pillow under knee, focus on keeping the knee straight while resting   DIET You may resume your previous home diet once your are discharged from the hospital.  DRESSING / WOUND CARE / SHOWERING You may shower 3 days after surgery, but keep the wounds dry during showering.  You may use an occlusive plastic wrap (Press'n Seal for example), NO SOAKING/SUBMERGING IN THE BATHTUB.  If the bandage gets wet, change with a clean dry gauze.  If the incision gets wet, pat the wound dry with a clean towel. You may start showering once you are discharged home but do not submerge the incision under water. Just pat the incision dry and apply a dry gauze dressing on daily. Change the surgical dressing daily and reapply a dry dressing each time.  ACTIVITY Walk with your walker as instructed. Use walker as long as suggested by your caregivers. Avoid periods of inactivity such as sitting longer than an hour when not asleep. This helps prevent blood clots.  You may resume a sexual relationship in one month or when given the OK by your  doctor.  You may return to work once you are cleared by your doctor.  Do not drive a car for 6 weeks or until released by you surgeon.  Do not drive whiWeight bearing as tolerated with assist device (walker, cane, etc) as directed, use it as long as suggested by your surgeon or therapist, typically at least 4-6 weeks.le taking narcotics.  WEIGHT BEARING   POSTOPERATIVE CONSTIPATION PROTOCOL Constipation - defined medically as fewer than three stools per week and severe constipation as less than one stool per week.  One of the most common issues patients have following surgery is constipation.  Even if you have a regular bowel pattern at home, your normal regimen is likely to be disrupted due to multiple reasons following surgery.  Combination of anesthesia, postoperative narcotics, change in appetite and fluid intake all can affect your bowels.  In order to avoid complications following surgery, here are some recommendations in order to help you during your recovery period.  Colace (docusate) - Pick up an over-the-counter form of Colace or another stool softener and take twice a day as long as you are requiring postoperative pain medications.  Take with a full glass of water daily.  If you experience loose stools or diarrhea, hold the colace until you stool forms back up.  If your symptoms do not get better within 1 week or if they get worse, check with your doctor.  Dulcolax (bisacodyl) - Pick up over-the-counter and take as directed by the product packaging as needed to assist with the movement of your bowels.  Take with a full glass of water.  Use this product as needed if not relieved by Colace only.   MiraLax (polyethylene glycol) - Pick up over-the-counter to have on hand.  MiraLax is a solution that will increase the amount of water in your bowels to assist with bowel movements.  Take as directed and can mix with a glass of water, juice, soda, coffee, or tea.  Take if you go more than two  days without a movement. Do not use MiraLax more than once per day. Call your doctor if you are still constipated or irregular after using this medication for 7 days in a row.  If you continue to have problems with postoperative constipation, please contact the office for further assistance and recommendations.  If you experience "the worst abdominal pain ever" or develop nausea or vomiting, please contact the office immediatly for further recommendations for treatment.  ITCHING  If you experience itching with your medications, try taking only a single pain pill, or even half a pain pill at a time.  You can also use Benadryl over the counter for itching or also to help with sleep.   TED HOSE STOCKINGS Wear the elastic stockings on both legs for three weeks following surgery during the day but you may remove then at night for sleeping.  MEDICATIONS See your medication summary on the "After Visit Summary" that the nursing staff will review with you prior to discharge.  You may have some home medications which will be placed on hold until you complete the course of blood thinner medication.  It is important for you to complete the blood thinner medication as prescribed by your surgeon.  Continue your approved medications as instructed at time of discharge.  PRECAUTIONS If you experience chest pain or shortness of breath - call 911 immediately for transfer to the hospital emergency department.  If you develop a fever greater that 101 F, purulent drainage from wound, increased redness or drainage from wound, foul odor from the wound/dressing, or calf pain - CONTACT YOUR SURGEON.                                                   FOLLOW-UP APPOINTMENTS Make sure you keep all of your appointments after your operation with your surgeon and caregivers. You should call the office at the above phone number and make an appointment for approximately two weeks after the date of your surgery or on the date  instructed by your surgeon outlined in the "After Visit Summary".   RANGE OF MOTION AND STRENGTHENING EXERCISES  Rehabilitation of the knee is important following a knee injury or an operation. After just a few days of immobilization, the muscles of the thigh which control the knee become weakened and  shrink (atrophy). Knee exercises are designed to build up the tone and strength of the thigh muscles and to improve knee motion. Often times heat used for twenty to thirty minutes before working out will loosen up your tissues and help with improving the range of motion but do not use heat for the first two weeks following surgery. These exercises can be done on a training (exercise) mat, on the floor, on a table or on a bed. Use what ever works the best and is most comfortable for you Knee exercises include:  Leg Lifts - While your knee is still immobilized in a splint or cast, you can do straight leg raises. Lift the leg to 60 degrees, hold for 3 sec, and slowly lower the leg. Repeat 10-20 times 2-3 times daily. Perform this exercise against resistance later as your knee gets better.  Quad and Hamstring Sets - Tighten up the muscle on the front of the thigh (Quad) and hold for 5-10 sec. Repeat this 10-20 times hourly. Hamstring sets are done by pushing the foot backward against an object and holding for 5-10 sec. Repeat as with quad sets.  Leg Slides: Lying on your back, slowly slide your foot toward your buttocks, bending your knee up off the floor (only go as far as is comfortable). Then slowly slide your foot back down until your leg is flat on the floor again. Angel Wings: Lying on your back spread your legs to the side as far apart as you can without causing discomfort.  A rehabilitation program following serious knee injuries can speed recovery and prevent re-injury in the future due to weakened muscles. Contact your doctor or a physical therapist for more information on knee rehabilitation.   IF YOU  ARE TRANSFERRED TO A SKILLED REHAB FACILITY If the patient is transferred to a skilled rehab facility following release from the hospital, a list of the current medications will be sent to the facility for the patient to continue.  When discharged from the skilled rehab facility, please have the facility set up the patient's [place] prior to being released. Also, the skilled facility will be responsible for providing the patient with their medications at time of release from the facility to include their pain medication, the muscle relaxants, and their blood thinner medication. If the patient is still at the rehab facility at time of the two week follow up appointment, the skilled rehab facility will also need to assist the patient in arranging follow up appointment in our office and any transportation needs.  MAKE SURE YOU:  Understand these instructions.  Get help right away if you are not doing well or get worse.    Pick up stool softner and laxative for home use following surgery while on pain medications. Do not submerge incision under water. Please use good hand washing techniques while changing dressing each day. May shower starting three days after surgery. Please use a clean towel to pat the incision dry following showers. Continue to use ice for pain and swelling after surgery. Do not use any lotions or creams on the incision until instructed by your surgeon.   Do not put a pillow under the knee. Place it under the heel.   Complete by:  As directed    Driving restrictions   Complete by:  As directed    No driving for two weeks   TED hose   Complete by:  As directed    Use stockings (TED hose) for  three weeks on both leg(s).  You may remove them at night for sleeping.   Weight bearing as tolerated   Complete by:  As directed      Allergies as of 07/11/2018   No Known Allergies     Medication List    STOP taking these medications   ALEVE 220 MG  Caps Generic drug:  Naproxen Sodium   aspirin EC 81 MG tablet   Vitamin D3 25 MCG (1000 UT) Caps     TAKE these medications   estradiol 0.1 MG/GM vaginal cream Commonly known as:  ESTRACE Place 5 g vaginally once a week. Once symptoms improve, decrease frequency of esterase to once weekly and further decrease based on symptoms   gabapentin 300 MG capsule Commonly known as:  NEURONTIN Take 1 capsule (300 mg total) by mouth 3 (three) times daily. Gabapentin 300 mg Protocol Take a 300 mg capsule three times a day for two weeks following surgery. Then take a 300 mg capsule two times a day for two weeks.  Then take a 300 mg capsule once a day for two weeks.  Then discontinue the Gabapentin.   letrozole 2.5 MG tablet Commonly known as:  FEMARA TAKE 1 TABLET BY MOUTH DAILY.   methocarbamol 500 MG tablet Commonly known as:  ROBAXIN Take 1 tablet (500 mg total) by mouth every 6 (six) hours as needed for muscle spasms.   oxyCODONE 5 MG immediate release tablet Commonly known as:  Oxy IR/ROXICODONE Take 1-2 tablets (5-10 mg total) by mouth every 6 (six) hours as needed for severe pain.   rivaroxaban 10 MG Tabs tablet Commonly known as:  XARELTO Take 1 tablet (10 mg total) by mouth daily with breakfast for 20 days. Then resume one 81 mg aspirin once a day.   rosuvastatin 10 MG tablet Commonly known as:  CRESTOR Take 1 tablet (10 mg total) by mouth daily.   traMADol 50 MG tablet Commonly known as:  ULTRAM Take 1-2 tablets (50-100 mg total) by mouth every 6 (six) hours as needed for moderate pain.   trimethoprim 100 MG tablet Commonly known as:  TRIMPEX Take 100 mg by mouth 2 (two) times daily.            Discharge Care Instructions  (From admission, onward)         Start     Ordered   07/11/18 0000  Weight bearing as tolerated     07/11/18 0814   07/11/18 0000  Change dressing    Comments:  Change dressing on Wednesday, then change the dressing daily with sterile 4 x  4 inch gauze dressing and apply TED hose.   07/11/18 4128         Follow-up Information    Gaynelle Arabian, MD. Schedule an appointment as soon as possible for a visit on 07/25/2018.   Specialty:  Orthopedic Surgery Contact information: [address] [place] [address] [phone]           Signed: Theresa Duty, PA-C Orthopedic Surgery 07/17/2018, 7:34 AM

## 2018-07-20 DIAGNOSIS — M25562 Pain in left knee: Secondary | ICD-10-CM | POA: Diagnosis not present

## 2018-07-25 DIAGNOSIS — M25562 Pain in left knee: Secondary | ICD-10-CM | POA: Diagnosis not present

## 2018-07-28 DIAGNOSIS — M25562 Pain in left knee: Secondary | ICD-10-CM | POA: Diagnosis not present

## 2018-07-31 DIAGNOSIS — M25562 Pain in left knee: Secondary | ICD-10-CM | POA: Diagnosis not present

## 2018-08-01 MED FILL — TRIMETHOPRIM 100 MG TABLET: 100 | 90 days supply | Qty: 180 | Fill #1

## 2018-08-04 DIAGNOSIS — M25562 Pain in left knee: Secondary | ICD-10-CM | POA: Diagnosis not present

## 2018-08-07 DIAGNOSIS — M25562 Pain in left knee: Secondary | ICD-10-CM | POA: Diagnosis not present

## 2018-08-09 DIAGNOSIS — M25562 Pain in left knee: Secondary | ICD-10-CM | POA: Diagnosis not present

## 2018-08-11 DIAGNOSIS — M25562 Pain in left knee: Secondary | ICD-10-CM | POA: Diagnosis not present

## 2018-08-14 DIAGNOSIS — M25562 Pain in left knee: Secondary | ICD-10-CM | POA: Diagnosis not present

## 2018-08-15 DIAGNOSIS — Z96652 Presence of left artificial knee joint: Secondary | ICD-10-CM | POA: Diagnosis not present

## 2018-08-15 DIAGNOSIS — Z471 Aftercare following joint replacement surgery: Secondary | ICD-10-CM | POA: Diagnosis not present

## 2018-08-17 DIAGNOSIS — M25562 Pain in left knee: Secondary | ICD-10-CM | POA: Diagnosis not present

## 2018-08-21 DIAGNOSIS — M25562 Pain in left knee: Secondary | ICD-10-CM | POA: Diagnosis not present

## 2018-08-23 DIAGNOSIS — M25562 Pain in left knee: Secondary | ICD-10-CM | POA: Diagnosis not present

## 2018-08-29 DIAGNOSIS — M25562 Pain in left knee: Secondary | ICD-10-CM | POA: Diagnosis not present

## 2018-09-06 ENCOUNTER — Other Ambulatory Visit: Payer: Self-pay | Admitting: Oncology

## 2018-09-06 MED FILL — LETROZOLE 2.5 MG TABLET: 2.5 | 90 days supply | Qty: 90 | Fill #0

## 2018-09-07 MED FILL — ROSUVASTATIN CALCIUM 10 MG: 10 | 90 days supply | Qty: 90 | Fill #1

## 2018-09-13 DIAGNOSIS — M25562 Pain in left knee: Secondary | ICD-10-CM | POA: Diagnosis not present

## 2018-09-18 DIAGNOSIS — D649 Anemia, unspecified: Secondary | ICD-10-CM | POA: Diagnosis not present

## 2018-09-18 DIAGNOSIS — Z23 Encounter for immunization: Secondary | ICD-10-CM | POA: Diagnosis not present

## 2018-09-18 DIAGNOSIS — C50919 Malignant neoplasm of unspecified site of unspecified female breast: Secondary | ICD-10-CM | POA: Diagnosis not present

## 2018-09-18 DIAGNOSIS — M179 Osteoarthritis of knee, unspecified: Secondary | ICD-10-CM | POA: Diagnosis not present

## 2018-09-18 DIAGNOSIS — Z79899 Other long term (current) drug therapy: Secondary | ICD-10-CM | POA: Diagnosis not present

## 2018-09-18 DIAGNOSIS — Z8249 Family history of ischemic heart disease and other diseases of the circulatory system: Secondary | ICD-10-CM | POA: Diagnosis not present

## 2018-09-18 DIAGNOSIS — N390 Urinary tract infection, site not specified: Secondary | ICD-10-CM | POA: Diagnosis not present

## 2018-09-18 DIAGNOSIS — E559 Vitamin D deficiency, unspecified: Secondary | ICD-10-CM | POA: Diagnosis not present

## 2018-09-18 DIAGNOSIS — Z0000 Encounter for general adult medical examination without abnormal findings: Secondary | ICD-10-CM | POA: Diagnosis not present

## 2018-09-18 DIAGNOSIS — R3129 Other microscopic hematuria: Secondary | ICD-10-CM | POA: Diagnosis not present

## 2018-09-18 DIAGNOSIS — E669 Obesity, unspecified: Secondary | ICD-10-CM | POA: Diagnosis not present

## 2018-09-18 MED FILL — AMOXICILLIN 500 MG CAPSULE: 500 | 5 days supply | Qty: 20 | Fill #0

## 2018-11-13 MED FILL — TRIMETHOPRIM 100 MG TABLET: 100 | 90 days supply | Qty: 180 | Fill #0

## 2018-12-05 ENCOUNTER — Other Ambulatory Visit: Payer: Self-pay | Admitting: Oncology

## 2018-12-05 DIAGNOSIS — Z1231 Encounter for screening mammogram for malignant neoplasm of breast: Secondary | ICD-10-CM

## 2018-12-13 MED FILL — LETROZOLE 2.5 MG TABLET: 2.5 | 90 days supply | Qty: 90 | Fill #0

## 2019-01-29 ENCOUNTER — Other Ambulatory Visit: Payer: Self-pay

## 2019-01-29 ENCOUNTER — Ambulatory Visit
Admission: RE | Admit: 2019-01-29 | Discharge: 2019-01-29 | Disposition: A | Discharge status: Home / Self Care | Payer: 59 | Source: Ambulatory Visit | Attending: Oncology | Admitting: Oncology

## 2019-01-29 DIAGNOSIS — Z1231 Encounter for screening mammogram for malignant neoplasm of breast: Secondary | ICD-10-CM

## 2019-02-21 MED FILL — TRIMETHOPRIM 100 MG TABLET: 100 | 90 days supply | Qty: 180 | Fill #1

## 2019-02-22 DIAGNOSIS — Z471 Aftercare following joint replacement surgery: Secondary | ICD-10-CM | POA: Diagnosis not present

## 2019-02-22 DIAGNOSIS — Z96652 Presence of left artificial knee joint: Secondary | ICD-10-CM | POA: Diagnosis not present

## 2019-03-13 DIAGNOSIS — E669 Obesity, unspecified: Secondary | ICD-10-CM | POA: Diagnosis not present

## 2019-03-13 DIAGNOSIS — N952 Postmenopausal atrophic vaginitis: Secondary | ICD-10-CM | POA: Diagnosis not present

## 2019-03-13 DIAGNOSIS — Z853 Personal history of malignant neoplasm of breast: Secondary | ICD-10-CM | POA: Diagnosis not present

## 2019-03-13 DIAGNOSIS — N941 Unspecified dyspareunia: Secondary | ICD-10-CM | POA: Diagnosis not present

## 2019-03-13 DIAGNOSIS — Z6838 Body mass index (BMI) 38.0-38.9, adult: Secondary | ICD-10-CM | POA: Diagnosis not present

## 2019-03-13 MED FILL — ESTRADIOL 0.1 MG/GM CREA: 0.1 | 90 days supply | Qty: 43 | Fill #0

## 2019-03-20 MED FILL — LETROZOLE 2.5 MG TABLET: 2.5 | 90 days supply | Qty: 90 | Fill #1

## 2019-04-03 ENCOUNTER — Encounter: Payer: Self-pay | Admitting: Cardiovascular Disease

## 2019-04-03 ENCOUNTER — Ambulatory Visit: Payer: 59 | Admitting: Cardiovascular Disease

## 2019-04-03 ENCOUNTER — Other Ambulatory Visit: Payer: Self-pay

## 2019-04-03 VITALS — BP 99/70 | HR 70 | Ht 66.0 in | Wt 230.0 lb

## 2019-04-03 DIAGNOSIS — I491 Atrial premature depolarization: Secondary | ICD-10-CM | POA: Diagnosis not present

## 2019-04-03 DIAGNOSIS — Z5181 Encounter for therapeutic drug level monitoring: Secondary | ICD-10-CM | POA: Diagnosis not present

## 2019-04-03 DIAGNOSIS — E785 Hyperlipidemia, unspecified: Secondary | ICD-10-CM

## 2019-04-03 LAB — COMPREHENSIVE METABOLIC PANEL
ALT: 13 IU/L (ref 0–32)
AST: 16 IU/L (ref 0–40)
Albumin/Globulin Ratio: 1.7 (ref 1.2–2.2)
Albumin: 4.3 g/dL (ref 3.8–4.8)
Alkaline Phosphatase: 103 IU/L (ref 39–117)
BUN/Creatinine Ratio: 14 (ref 12–28)
BUN: 15 mg/dL (ref 8–27)
Bilirubin Total: 0.4 mg/dL (ref 0.0–1.2)
CO2: 23 mmol/L (ref 20–29)
Calcium: 9.4 mg/dL (ref 8.7–10.3)
Chloride: 103 mmol/L (ref 96–106)
Creatinine, Ser: 1.11 mg/dL — ABNORMAL HIGH (ref 0.57–1.00)
GFR calc Af Amer: 59 mL/min/{1.73_m2} — ABNORMAL LOW (ref >59)
GFR calc non Af Amer: 52 mL/min/{1.73_m2} — ABNORMAL LOW (ref >59)
Globulin, Total: 2.6 g/dL (ref 1.5–4.5)
Glucose: 107 mg/dL — ABNORMAL HIGH (ref 65–99)
Potassium: 5 mmol/L (ref 3.5–5.2)
Sodium: 136 mmol/L (ref 134–144)
Total Protein: 6.9 g/dL (ref 6.0–8.5)

## 2019-04-03 LAB — CBC WITH DIFFERENTIAL/PLATELET
Basophils Absolute: 0.1 10*3/uL (ref 0.0–0.2)
Basos: 1 %
EOS (ABSOLUTE): 0.1 10*3/uL (ref 0.0–0.4)
Eos: 1 %
Hematocrit: 38.7 % (ref 34.0–46.6)
Hemoglobin: 12.6 g/dL (ref 11.1–15.9)
Immature Grans (Abs): 0 10*3/uL (ref 0.0–0.1)
Immature Granulocytes: 1 %
Lymphocytes Absolute: 2.1 10*3/uL (ref 0.7–3.1)
Lymphs: 46 %
MCH: 27.3 pg (ref 26.6–33.0)
MCHC: 32.6 g/dL (ref 31.5–35.7)
MCV: 84 fL (ref 79–97)
Monocytes Absolute: 0.3 10*3/uL (ref 0.1–0.9)
Monocytes: 7 %
Neutrophils Absolute: 1.9 10*3/uL (ref 1.4–7.0)
Neutrophils: 44 %
Platelets: 229 10*3/uL (ref 150–450)
RBC: 4.61 x10E6/uL (ref 3.77–5.28)
RDW: 14.6 % (ref 11.7–15.4)
WBC: 4.4 10*3/uL (ref 3.4–10.8)

## 2019-04-03 LAB — LIPID PANEL
Chol/HDL Ratio: 2.2 ratio (ref 0.0–4.4)
Cholesterol, Total: 145 mg/dL (ref 100–199)
HDL: 67 mg/dL (ref >39)
LDL Chol Calc (NIH): 67 mg/dL (ref 0–99)
Triglycerides: 50 mg/dL (ref 0–149)
VLDL Cholesterol Cal: 11 mg/dL (ref 5–40)

## 2019-04-03 LAB — MAGNESIUM: Magnesium: 2.1 mg/dL (ref 1.6–2.3)

## 2019-04-03 LAB — TSH: TSH: 1.44 u[IU]/mL (ref 0.450–4.500)

## 2019-04-03 NOTE — Progress Notes (Signed)
Cardiology Office Note   Date:  04/03/2019   ID:  Melissa [name], DOB 09-12-1950, MRN [id number]  PCP:  Josetta Huddle, MD  Cardiologist:   Skeet Latch, MD   No chief complaint on file.   History of Present Illness:  Melissa [name] is a 68 y.o. female with asymptomatic coronary calcification, hyperlipidemia, and recurrent breast cancer status post lumpectomy and XRT who presents for follow up.   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 She has several family members who died of cardiovascular disease and her brother had recently died from a heart attack. Her father had a heart attack in his 51s and several paternal uncles also had heart attacks in their 78s. She denied chest pain but did report exertional dyspnea.  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  Exercise Cardiolite 06/2015 was negative for ischemia.  She achieved 8.5 METS. 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  She had proximal LAD plaque.  She was started on rosuvastatin.  Since her last appointment she had her L knee replaced 06/2018.   She has done well with her knee.  She is back to walking 3 days/week for 30 minutes.  She also recently got a palatine bike.  She rides the bike for 20 or 30 minutes daily.  She has no exertional chest pain.  She does get short of breath with exertion which she attributes to weight gain and not being in her daily exercise routine.  She is disappointed that she cannot do as much as she used to, as she used to spin almost daily for years.  She has also struggled with eating more sweets since being home due to coronavirus.  She has occasional episodes of heart fluttering.  It last for 10 to 15 seconds at most.  The episodes are sporadic.  She had one last week but cannot remember the time before that.   She feels as though something is moving into her throat.  It is not related to exertion or eating.  It is not associated with shortness of breath, lightheadedness, or dizziness.  She feels generally fatigued but attributes this to weight gain.  Since her last appointment she started taking rosuvastatin every other day due to myalgias.  She no longer has the myalgias.   Past Medical History:  Diagnosis Date  . Anemia   . AR (aortic regurgitation) 06/2015   Trivial, Noted on ECHO  . Breast cancer ([place]) 07/23/97   left, tx w/xrt, Tamoxifen x 5 yrs  . Chronic pain    left knee  . DDD (degenerative disc disease), lumbar 04/05/2018   xray  . Diastolic dysfunction without heart failure 09/01/2015   Grade 1 diastolic dysfunction on echo 06/2015  . Diverticulitis    pt unaware  . DVT complicating pregnancy    Right calf, surface  . History of bladder infections   . History of endometriosis   . Hx of radiation therapy 08/22/97 - 10/03/97   left breast  . MR (mitral regurgitation) 06/2015   Noted on ECHO  . OA (osteoarthritis)    Bilateral Knees  . Obesity   . Personal history of radiation therapy   . Recurrent breast cancer ([place]) 01/04/12   biopsy, ER/PR+, Her 2 -  . Shortness of breath 06/26/2015, 09/07/2016  .  TR (tricuspid regurgitation) 06/2015   Noted on ECHO    Past Surgical History:  Procedure Laterality Date  . ABDOMINAL HYSTERECTOMY  1987   endometriosos  . BREAST BIOPSY Left 01/04/2012  . BREAST LUMPECTOMY  02/14/2012   LUMPECTOMY;  Surgeon: Haywood Lasso, MD;  Location: [facility];  Service: General;  Laterality: Left;  . [place]   lumpectomy-left  . CESAREAN SECTION     x 3  . CHOLECYSTECTOMY  1993  . COLONOSCOPY    . HERNIA REPAIR  [id number]   umbilical   . ovary removed  1987  . TOTAL KNEE ARTHROPLASTY Left 07/10/2018   Procedure: LEFT TOTAL KNEE ARTHROPLASTY;  Surgeon: Gaynelle Arabian, MD;  Location: [facility];  Service: Orthopedics;  Laterality: Left;  53min      Current Outpatient Medications  Medication Sig Dispense Refill  . estradiol (ESTRACE) 0.1 MG/GM vaginal cream Place 5 g vaginally once a week. Once symptoms improve, decrease frequency of esterase to once weekly and further decrease based on symptoms    . letrozole (FEMARA) 2.5 MG tablet TAKE 1 TABLET BY MOUTH DAILY. 90 tablet 2  . rosuvastatin (CRESTOR) 10 MG tablet Take 10 mg by mouth every other day.    . trimethoprim (TRIMPEX) 100 MG tablet Take 100 mg by mouth 2 (two) times daily.     No current facility-administered medications for this visit.     Allergies:   Patient has no known allergies.    Social History:  The patient  reports that she has never smoked. She has never used smokeless tobacco. She reports that she does not drink alcohol or use drugs.   Family History:  The patient's family history includes Breast cancer in her maternal aunt; Cancer in her maternal aunt, maternal aunt, maternal grandfather, and paternal aunt; Cancer (age of onset: 65) in her mother; Heart attack in her brother, father, and paternal grandfather; Heart failure in her sister; Prostate cancer in her maternal uncle.    ROS:  Please see the history of present illness.   Otherwise, review of systems are positive for none.   All other systems are reviewed and negative.    PHYSICAL EXAM: VS:  BP 99/70   Pulse 70   Ht 5\' 6"  (1.676 m)   Wt 230 lb (104.3 kg)   SpO2 97%   BMI 37.12 kg/m  , BMI Body mass index is 37.12 kg/m. GENERAL:  Well appearing HEENT: Pupils equal round and reactive, fundi not visualized, oral mucosa unremarkable NECK:  No jugular venous distention, waveform within normal limits, carotid upstroke brisk and symmetric, no bruits LUNGS:  Clear to auscultation bilaterally HEART:  RRR.  PMI not displaced or sustained,S1 and S2 within normal limits, no S3, no S4, no clicks, no rubs, no murmurs ABD:  Flat, positive bowel sounds normal in frequency in pitch, no bruits, no rebound, no  guarding, no midline pulsatile mass, no hepatomegaly, no splenomegaly EXT:  2 plus pulses throughout, no edema, no cyanosis no clubbing SKIN:  No rashes no nodules NEURO:  Cranial nerves II through XII grossly intact, motor grossly intact throughout PSYCH:  Cognitively intact, oriented to person place and time   EKG:  EKG is ordered today. 09/07/16: Sinus rhythm.  Rate 71.  LAFB.  Diffuse TWI anterolateral and inferior TWI 10/05/17: Sinus rhythm.  Rate 62 bpm.  Anterolateral and inferior T wave inversions.  04/03/19: Sinus rhythm.  Rate 70 bpm.   Anterolateral T wave inversions.  Unchanged  from prior.  Echo 07/08/15: Study Conclusions  - Left ventricle: The cavity size was normal. Wall thickness was normal. Systolic function was normal. The estimated ejection fraction was in the range of 55% to 60%. Wall motion was normal; there were no regional wall motion abnormalities. Doppler parameters are consistent with abnormal left ventricular relaxation (grade 1 diastolic dysfunction). - Aortic valve: There was no stenosis. There was trivial regurgitation. - Aorta: Mildly dilated aortic root. Aortic root dimension: 38 mm (ED). - Mitral valve: There was mild regurgitation. - Right ventricle: The cavity size was normal. Systolic function was normal. - Tricuspid valve: Peak RV-RA gradient (S): 29 mm Hg. - Pulmonary arteries: PA peak pressure: 32 mm Hg (S). - Inferior vena cava: The vessel was normal in size. The respirophasic diameter changes were in the normal range (>= 50%), consistent with normal central venous pressure.  Impressions:  - Normal LV size with EF 55-60%. Normal RV size and systolic function. Mild mitral regurgitation and mild tricuspid regurgitation.  Exercise Cardiolite 07/16/15:  The left ventricular ejection fraction is normal (55-65%).  Nuclear stress EF: 57%.  There was no ST segment deviation noted during stress.  This is a low risk  study.  Low risk stress nuclear study with a small, mild, fixed apical defect consistent with soft tissue attenuation/apical thinning; no ischemia; EF 57 with normal wall motion.  Coronary calcium score 09/23/16: IMPRESSION: 1. Coronary artery calcium score 5.8 Agatston units. This places the patient in the 59th percentile for age and gender. This suggests intermediate risk for future cardiac events.  2. Mixed plaque in proximal LAD without significant stenosis. No other coronary disease noted.  Recent Labs: 07/03/2018: ALT 19 07/11/2018: BUN 12; Creatinine, Ser 0.73; Hemoglobin 10.7; Platelets 205; Potassium 4.2; Sodium 137    Lipid Panel    Component Value Date/Time   CHOL 178 05/09/2018 0924   TRIG 49 05/09/2018 0924   HDL 58 05/09/2018 0924   CHOLHDL 3.1 05/09/2018 0924   LDLCALC 110 (H) 05/09/2018 0924   08/26/14: Chol 181, HDL 62, LDL 98  08/27/16: Cholesterol 179, HDL    65, LDL 100, triglycerides 67 Sodium 142, potassium 4.8, BUN 14, creatinine 0.96 AST 18, ALT 19  09/05/17:  Sodium 141, potassium 4.5, BUN 19, creatinine 0.93 AST 20, ALT 18 Total cholesterol 190, triglycerides 57, HDL 60, LDL 119 TSH 1.19.  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  We discussed the fact that she had some mild blockages in her proximal LAD.  Goal LDL <70.  She has been taking rosuvastatin every other day.  Check lipids and CMP today.  # Fatigue:  She attributes this to weight gain and being less active.  Check CMP, CBC, and TSH.  # Palpitations: PAC noted on EKG and on exam.  The episodes are sporadic and short lived.  Will get a monitor if more frequent or she develops more symptoms.  Check TSH, CBC, magnesium, CMP.  # Diastolic dysfunction: Echo  revealed grade 1 diastolic dysfunction.  She has no evidence of heart failure on exam.   # Obesity: Mrs. Stake continues to work hard with diet and exercise.  She is already working hard with diet and exercise.  Current medicines are reviewed at length with the patient today.  The patient does not have concerns regarding medicines.  The following changes have been made:  no change   Labs/ tests ordered today include:   Orders Placed This Encounter  Procedures  . TSH  . Lipid panel  . CBC with Differential/Platelet  . Comprehensive metabolic panel  . Magnesium  . EKG 12-Lead     Disposition:   FU with Maximilian Tallo C. Oval Linsey, MD, [facility] in 1 year.   Signed, Mavric Cortright C. Oval Linsey, MD, [facility]  04/03/2019 1:33 PM    [facility]

## 2019-04-03 NOTE — Patient Instructions (Signed)
Medication Instructions:  Your physician recommends that you continue on your current medications as directed. Please refer to the Current Medication list given to you today.  If you need a refill on your cardiac medications before your next appointment, please call your pharmacy.   Lab work: LP/CMET/CBC/THS/MAGNESIUM TODAY   If you have labs (blood work) drawn today and your tests are completely normal, you will receive your results only by: Marland Kitchen MyChart Message (if you have MyChart) OR . A paper copy in the mail If you have any lab test that is abnormal or we need to change your treatment, we will call you to review the results.  Testing/Procedures: NONE  Follow-Up: At [facility], you and your health needs are our priority.  As part of our continuing mission to provide you with exceptional heart care, we have created designated Provider Care Teams.  These Care Teams include your primary Cardiologist (physician) and Advanced Practice Providers (APPs -  Physician Assistants and Nurse Practitioners) who all work together to provide you with the care you need, when you need it. You will need a follow up appointment in 12 months.  Please call our office 2 months in advance to schedule this appointment.  You may see Skeet Latch, MD or one of the following Advanced Practice Providers on your designated Care Team:   Kerin Ransom, PA-C Roby Lofts, [place] . Sande Rives, PA-C

## 2019-04-05 ENCOUNTER — Telehealth: Payer: Self-pay | Admitting: *Deleted

## 2019-04-05 NOTE — Telephone Encounter (Signed)
Left message to call back  

## 2019-04-05 NOTE — Telephone Encounter (Signed)
-----   Message from Skeet Latch, MD sent at 04/04/2019  1:44 PM EDT ----- Thyroid function is normal.  Cholesterol levels are excellent.  Blood counts are normal.  Kidney function is slightly worse.  Make sure she is drinking plenty of fluids.  Fasting gucose was a little bit high consistent with prediabetes.  Keep working on diet and exercise and follow-up with Dr. Inda Merlin about this.

## 2019-04-06 ENCOUNTER — Other Ambulatory Visit: Payer: Self-pay

## 2019-04-06 ENCOUNTER — Inpatient Hospital Stay: Payer: 59 | Attending: Oncology | Admitting: Oncology

## 2019-04-06 VITALS — BP 127/82 | HR 65 | Temp 97.6°F | Resp 18 | Ht 66.0 in | Wt 231.9 lb

## 2019-04-06 DIAGNOSIS — Z23 Encounter for immunization: Secondary | ICD-10-CM | POA: Diagnosis not present

## 2019-04-06 DIAGNOSIS — Z170 Estrogen receptor positive status [ER+]: Secondary | ICD-10-CM | POA: Insufficient documentation

## 2019-04-06 DIAGNOSIS — C50412 Malignant neoplasm of upper-outer quadrant of left female breast: Secondary | ICD-10-CM | POA: Insufficient documentation

## 2019-04-06 MED ORDER — INFLUENZA VAC A&B SA ADJ QUAD 0.5 ML IM PRSY
0.5000 mL | PREFILLED_SYRINGE | Freq: Once | INTRAMUSCULAR | Status: AC
  Administered 2019-04-06: 12:00:00 0.5 mL via INTRAMUSCULAR
  Filled 2019-04-06: qty 0.5

## 2019-04-06 NOTE — Progress Notes (Signed)
  [facility] OFFICE PROGRESS NOTE   Diagnosis: Breast cancer  INTERVAL HISTORY:   Melissa [name] returns as scheduled.  She continues Femara.  Bilateral mammogram on 01/29/2019 was negative.  She feels well.  Good appetite.  The pain has improved following surgery.  She continues arthralgias.  No palpable change in either breast.  Objective:  Vital signs in last 24 hours:  Blood pressure 127/82, pulse 65, temperature 97.6 F (36.4 C), temperature source Oral, resp. rate 18, height _0  (1.676 m), weight 231 lb 14.4 oz (105.2 kg), SpO2 99 %.    HEENT: Neck without mass Lymphatics: No cervical, supraclavicular, or axillary nodes GI: No hepatomegaly Vascular: No leg edema Breast: Bilateral breast without mass.  There is a pea-sized mobile cutaneous lesion superior to the medial aspect of the left axillary scar.   Lab Results:  Lab Results  Component Value Date   WBC 4.4 04/03/2019   HGB 12.6 04/03/2019   HCT 38.7 04/03/2019   MCV 84 04/03/2019   PLT 229 04/03/2019   NEUTROABS 1.9 04/03/2019    CMP  Lab Results  Component Value Date   NA 136 04/03/2019   K 5.0 04/03/2019   CL 103 04/03/2019   CO2 23 04/03/2019   GLUCOSE 107 (H) 04/03/2019   BUN 15 04/03/2019   CREATININE 1.11 (H) 04/03/2019   CALCIUM 9.4 04/03/2019   PROT 6.9 04/03/2019   ALBUMIN 4.3 04/03/2019   AST 16 04/03/2019   ALT 13 04/03/2019   ALKPHOS 103 04/03/2019   BILITOT 0.4 04/03/2019   GFRNONAA 52 (L) 04/03/2019   GFRAA 59 (L) 04/03/2019    Medications: I have reviewed the patient's current medications.   Assessment/Plan: 1.Stage I (T1 N0) left-sided breast cancer diagnosed in January of 1999, ER positive, PR positive, HER-2 negative, status post a left lumpectomy, left axillary lymph node dissection, and left breast radiation. She completed 5 years of adjuvant tamoxifen therapy in January of 2004.  2. Recurrent invasive breast cancer near the left lumpectomy scar-confirmed on a  needle core biopsy 01/05/2012, the pathology is consistent with invasive breast cancer, ER positive, PR positive, HER-2 negative. ? Local recurrence versus a new breast primary  -breast MRI 01/17/2012 confirmed an isolated mass in the upper outer left breast  -bone scan on 01/19/2012-negative aside from an area of very subtle uptake in the inferior sternum without a CT correlate  -staging CTs of the chest, abdomen, and pelvis on 01/19/2012-negative for metastatic disease  -Partial mastectomy 02/14/2012 confirmed a 1.5 cm grade 2 invasive carcinoma with associated DCIS and negative surgical margins  -Oncotype recurrence score-24  -Initiation of Femara after an office visit on 03/09/2012  3. Tiny cutaneous nodular lesion overlying the left clavicle when she was here on 03/09/2012  4. Knee Arthralgias-most likely related to degenerative arthritis-followed by orthopedics 5. Pea-sized nodular lesion near the left axillary scar -stable    Disposition: Melissa [name] is in remission from breast cancer.  She will continue Femara.  She continues yearly mammography.  She will follow-up with Dr. Inda Merlin for management of bone density.  Melissa [name] received an influenza vaccine today.   I recommend continuing indefinite Femara as it is unclear whether the most recent left breast cancer is a new primary versus local recurrence.  Melissa [name] will return for office visit in 1 year.  She will contact us in the interim for new symptoms.  Betsy Coder, MD  04/06/2019  11:59 AM

## 2019-04-09 ENCOUNTER — Telehealth: Payer: Self-pay | Admitting: Oncology

## 2019-04-09 NOTE — Telephone Encounter (Signed)
Called and left msg. Mailed printout  °

## 2019-04-19 NOTE — Telephone Encounter (Signed)
Advised patient of lab results and sent to PCP  Mailed patient copy

## 2019-06-12 MED FILL — TRIMETHOPRIM 100 MG TABLET: 100 | 90 days supply | Qty: 180 | Fill #0

## 2019-07-05 ENCOUNTER — Other Ambulatory Visit: Payer: Self-pay | Admitting: Cardiovascular Disease

## 2019-07-05 ENCOUNTER — Other Ambulatory Visit: Payer: Self-pay | Admitting: Oncology

## 2019-07-05 MED FILL — ROSUVASTATIN CALCIUM 10 MG: 10 | 90 days supply | Qty: 90 | Fill #0

## 2019-07-05 MED FILL — LETROZOLE 2.5 MG TABLET: 2.5 | 90 days supply | Qty: 90 | Fill #0

## 2019-08-26 ENCOUNTER — Ambulatory Visit: Payer: 59

## 2019-09-01 ENCOUNTER — Ambulatory Visit: Payer: 59

## 2019-09-06 ENCOUNTER — Ambulatory Visit: Payer: 59

## 2019-09-19 MED FILL — TRIMETHOPRIM 100 MG TABLET: 100 | 90 days supply | Qty: 180 | Fill #1

## 2019-09-26 MED FILL — LETROZOLE 2.5 MG TABLET: 2.5 | 90 days supply | Qty: 90 | Fill #1

## 2019-10-29 DIAGNOSIS — Z0000 Encounter for general adult medical examination without abnormal findings: Secondary | ICD-10-CM | POA: Diagnosis not present

## 2019-10-29 DIAGNOSIS — Z13220 Encounter for screening for lipoid disorders: Secondary | ICD-10-CM | POA: Diagnosis not present

## 2019-10-29 DIAGNOSIS — R3129 Other microscopic hematuria: Secondary | ICD-10-CM | POA: Diagnosis not present

## 2019-10-29 DIAGNOSIS — Z800 Family history of malignant neoplasm of digestive organs: Secondary | ICD-10-CM | POA: Diagnosis not present

## 2019-10-29 DIAGNOSIS — Z8249 Family history of ischemic heart disease and other diseases of the circulatory system: Secondary | ICD-10-CM | POA: Diagnosis not present

## 2019-10-29 DIAGNOSIS — N390 Urinary tract infection, site not specified: Secondary | ICD-10-CM | POA: Diagnosis not present

## 2019-10-29 DIAGNOSIS — Z79899 Other long term (current) drug therapy: Secondary | ICD-10-CM | POA: Diagnosis not present

## 2019-10-29 DIAGNOSIS — D649 Anemia, unspecified: Secondary | ICD-10-CM | POA: Diagnosis not present

## 2019-10-29 DIAGNOSIS — M179 Osteoarthritis of knee, unspecified: Secondary | ICD-10-CM | POA: Diagnosis not present

## 2019-10-29 DIAGNOSIS — E669 Obesity, unspecified: Secondary | ICD-10-CM | POA: Diagnosis not present

## 2019-10-29 DIAGNOSIS — Z853 Personal history of malignant neoplasm of breast: Secondary | ICD-10-CM | POA: Diagnosis not present

## 2019-10-29 DIAGNOSIS — E559 Vitamin D deficiency, unspecified: Secondary | ICD-10-CM | POA: Diagnosis not present

## 2019-11-01 ENCOUNTER — Other Ambulatory Visit: Payer: Self-pay | Admitting: Internal Medicine

## 2019-11-01 DIAGNOSIS — M8580 Other specified disorders of bone density and structure, unspecified site: Secondary | ICD-10-CM

## 2020-01-01 ENCOUNTER — Other Ambulatory Visit: Payer: Self-pay | Admitting: Oncology

## 2020-01-01 DIAGNOSIS — Z1231 Encounter for screening mammogram for malignant neoplasm of breast: Secondary | ICD-10-CM

## 2020-01-03 ENCOUNTER — Other Ambulatory Visit (HOSPITAL_COMMUNITY): Payer: Self-pay | Admitting: Internal Medicine

## 2020-01-07 ENCOUNTER — Other Ambulatory Visit: Payer: Self-pay | Admitting: Oncology

## 2020-01-07 MED FILL — LETROZOLE 2.5 MG TABLET: 2.5 | 90 days supply | Qty: 90 | Fill #0

## 2020-01-18 ENCOUNTER — Other Ambulatory Visit: Payer: 59

## 2020-01-31 ENCOUNTER — Ambulatory Visit: Payer: 59

## 2020-02-06 ENCOUNTER — Ambulatory Visit
Admission: RE | Admit: 2020-02-06 | Discharge: 2020-02-06 | Disposition: A | Discharge status: Home / Self Care | Payer: 59 | Source: Ambulatory Visit | Attending: Internal Medicine | Admitting: Internal Medicine

## 2020-02-06 ENCOUNTER — Ambulatory Visit
Admission: RE | Admit: 2020-02-06 | Discharge: 2020-02-06 | Disposition: A | Discharge status: Home / Self Care | Payer: 59 | Source: Ambulatory Visit | Attending: Oncology | Admitting: Oncology

## 2020-02-06 ENCOUNTER — Other Ambulatory Visit: Payer: Self-pay

## 2020-02-06 DIAGNOSIS — Z780 Asymptomatic menopausal state: Secondary | ICD-10-CM | POA: Diagnosis not present

## 2020-02-06 DIAGNOSIS — Z1231 Encounter for screening mammogram for malignant neoplasm of breast: Secondary | ICD-10-CM

## 2020-02-06 DIAGNOSIS — M8580 Other specified disorders of bone density and structure, unspecified site: Secondary | ICD-10-CM

## 2020-02-06 DIAGNOSIS — M85851 Other specified disorders of bone density and structure, right thigh: Secondary | ICD-10-CM | POA: Diagnosis not present

## 2020-02-20 MED FILL — NULYTELY LEMON-LI SOL: 1 days supply | Qty: 4000 | Fill #0

## 2020-03-04 DIAGNOSIS — Z86010 Personal history of colonic polyps: Secondary | ICD-10-CM | POA: Diagnosis not present

## 2020-03-04 DIAGNOSIS — D122 Benign neoplasm of ascending colon: Secondary | ICD-10-CM | POA: Diagnosis not present

## 2020-03-04 DIAGNOSIS — Z800 Family history of malignant neoplasm of digestive organs: Secondary | ICD-10-CM | POA: Diagnosis not present

## 2020-03-05 DIAGNOSIS — M25561 Pain in right knee: Secondary | ICD-10-CM | POA: Diagnosis not present

## 2020-03-05 DIAGNOSIS — M1711 Unilateral primary osteoarthritis, right knee: Secondary | ICD-10-CM | POA: Diagnosis not present

## 2020-04-07 ENCOUNTER — Inpatient Hospital Stay: Payer: 59 | Attending: Oncology | Admitting: Oncology

## 2020-04-07 ENCOUNTER — Other Ambulatory Visit: Payer: Self-pay

## 2020-04-07 VITALS — BP 106/65 | HR 74 | Temp 98.0°F | Resp 20 | Ht 66.0 in | Wt 236.0 lb

## 2020-04-07 DIAGNOSIS — M25561 Pain in right knee: Secondary | ICD-10-CM | POA: Diagnosis not present

## 2020-04-07 DIAGNOSIS — C50412 Malignant neoplasm of upper-outer quadrant of left female breast: Secondary | ICD-10-CM

## 2020-04-07 DIAGNOSIS — M1711 Unilateral primary osteoarthritis, right knee: Secondary | ICD-10-CM

## 2020-04-07 DIAGNOSIS — Z23 Encounter for immunization: Secondary | ICD-10-CM | POA: Diagnosis not present

## 2020-04-07 DIAGNOSIS — Z170 Estrogen receptor positive status [ER+]: Secondary | ICD-10-CM | POA: Diagnosis not present

## 2020-04-07 DIAGNOSIS — M8580 Other specified disorders of bone density and structure, unspecified site: Secondary | ICD-10-CM | POA: Diagnosis not present

## 2020-04-07 MED ORDER — INFLUENZA VAC A&B SA ADJ QUAD 0.5 ML IM PRSY
0.5000 mL | PREFILLED_SYRINGE | Freq: Once | INTRAMUSCULAR | Status: AC
  Administered 2020-04-07: 0.5 mL via INTRAMUSCULAR

## 2020-04-07 MED ORDER — INFLUENZA VAC A&B SA ADJ QUAD 0.5 ML IM PRSY
PREFILLED_SYRINGE | INTRAMUSCULAR | Status: AC
  Filled 2020-04-07: qty 0.5

## 2020-04-07 MED FILL — CEPHALEXIN 250 MG CAPS: 250 | 84 days supply | Qty: 36 | Fill #1

## 2020-04-07 NOTE — Progress Notes (Signed)
[place] OFFICE PROGRESS NOTE   Diagnosis: Breast cancer  INTERVAL HISTORY:   Melissa [name] returns as scheduled.  She continues letrozole.  She has pain in the right knee related to arthritis.  She does not have diffuse arthralgias.  A bilateral mammogram on 02/06/2020 was negative.  Objective:  Vital signs in last 24 hours:  Blood pressure 106/65, pulse 74, temperature 98 F (36.7 C), temperature source Tympanic, resp. rate 20, height $RemoveBe'5\' 6"'PeRIWtxZw$  (1.676 m), weight 236 lb (107 kg), SpO2 100 %.    Lymphatics: No cervical, supraclavicular, or axillary nodes Resp: Lungs clear bilaterally Cardio: Regular rate and rhythm GI: No hepatosplenomegaly Vascular: No leg edema Breast: Bilateral breasts without mass.  No palpable nodule at the left axillary scar.    Lab Results:  Lab Results  Component Value Date   WBC 4.4 04/03/2019   HGB 12.6 04/03/2019   HCT 38.7 04/03/2019   MCV 84 04/03/2019   PLT 229 04/03/2019   NEUTROABS 1.9 04/03/2019    CMP  Lab Results  Component Value Date   NA 136 04/03/2019   K 5.0 04/03/2019   CL 103 04/03/2019   CO2 23 04/03/2019   GLUCOSE 107 (H) 04/03/2019   BUN 15 04/03/2019   CREATININE 1.11 (H) 04/03/2019   CALCIUM 9.4 04/03/2019   PROT 6.9 04/03/2019   ALBUMIN 4.3 04/03/2019   AST 16 04/03/2019   ALT 13 04/03/2019   ALKPHOS 103 04/03/2019   BILITOT 0.4 04/03/2019   GFRNONAA 52 (L) 04/03/2019   GFRAA 59 (L) 04/03/2019    No results found for: CEA1  Lab Results  Component Value Date   INR 0.90 07/03/2018    Imaging:  No results found.  Medications: I have reviewed the patient's current medications.   Assessment/Plan:  1.Stage I (T1 N0) left-sided breast cancer diagnosed in January of 1999, ER positive, PR positive, HER-2 negative, status post a left lumpectomy, left axillary lymph node dissection, and left breast radiation. She completed 5 years of adjuvant tamoxifen therapy in January of 2004.  2. Recurrent  invasive breast cancer near the left lumpectomy scar-confirmed on a needle core biopsy 01/05/2012, the pathology is consistent with invasive breast cancer, ER positive, PR positive, HER-2 negative. ? Local recurrence versus a new breast primary  -breast MRI 01/17/2012 confirmed an isolated mass in the upper outer left breast  -bone scan on 01/19/2012-negative aside from an area of very subtle uptake in the inferior sternum without a CT correlate  -staging CTs of the chest, abdomen, and pelvis on 01/19/2012-negative for metastatic disease  -Partial mastectomy 02/14/2012 confirmed a 1.5 cm grade 2 invasive carcinoma with associated DCIS and negative surgical margins  -Oncotype recurrence score-24  -Initiation of Femara after an office visit on 03/09/2012  3. Tiny cutaneous nodular lesion overlying the left clavicle when she was here on 03/09/2012  4. Knee Arthralgias-most likely related to degenerative arthritis-status post left total knee arthroplasty 07/10/2018 5. Pea-sized nodular lesion near the left axillary scar -stable     Disposition: Melissa [name] is in remission from breast cancer.  She will continue letrozole.  She will continue follow-up with Dr. Inda Merlin for management of osteopenia.  She will continue follow-up with Dr.Aluisio to consider right knee replacement. Melissa [name] will return for an office visit in 1 year.  She received an influenza vaccine today.  She has received the COVID-19 vaccine and plans to obtain a booster vaccine when available.  Betsy Coder, MD  04/07/2020  11:53  AM   

## 2020-04-08 ENCOUNTER — Telehealth: Payer: Self-pay | Admitting: Oncology

## 2020-04-08 NOTE — Telephone Encounter (Signed)
Scheduled appointment per 9/13 los. Patient is aware of appointment date and time.

## 2020-04-10 ENCOUNTER — Encounter: Payer: Self-pay | Admitting: Cardiovascular Disease

## 2020-04-10 ENCOUNTER — Other Ambulatory Visit: Payer: Self-pay

## 2020-04-10 ENCOUNTER — Ambulatory Visit (INDEPENDENT_AMBULATORY_CARE_PROVIDER_SITE_OTHER): Payer: 59 | Admitting: Cardiovascular Disease

## 2020-04-10 VITALS — BP 110/72 | HR 65 | Ht 66.0 in | Wt 238.0 lb

## 2020-04-10 DIAGNOSIS — I2584 Coronary atherosclerosis due to calcified coronary lesion: Secondary | ICD-10-CM

## 2020-04-10 DIAGNOSIS — I2510 Atherosclerotic heart disease of native coronary artery without angina pectoris: Secondary | ICD-10-CM | POA: Insufficient documentation

## 2020-04-10 DIAGNOSIS — E669 Obesity, unspecified: Secondary | ICD-10-CM | POA: Diagnosis not present

## 2020-04-10 DIAGNOSIS — E7800 Pure hypercholesterolemia, unspecified: Secondary | ICD-10-CM

## 2020-04-10 DIAGNOSIS — E785 Hyperlipidemia, unspecified: Secondary | ICD-10-CM

## 2020-04-10 HISTORY — DX: Atherosclerotic heart disease of native coronary artery without angina pectoris: I25.10

## 2020-04-10 HISTORY — DX: Pure hypercholesterolemia, unspecified: E78.00

## 2020-04-10 HISTORY — DX: Obesity, unspecified: E66.9

## 2020-04-10 NOTE — Patient Instructions (Signed)
Medication Instructions:  Your physician recommends that you continue on your current medications as directed. Please refer to the Current Medication list given to you today.  *If you need a refill on your cardiac medications before your next appointment, please call your pharmacy*  Lab Work: NONE   Testing/Procedures: NONE  Follow-Up: At [facility], you and your health needs are our priority.  As part of our continuing mission to provide you with exceptional heart care, we have created designated Provider Care Teams.  These Care Teams include your primary Cardiologist (physician) and Advanced Practice Providers (APPs -  Physician Assistants and Nurse Practitioners) who all work together to provide you with the care you need, when you need it.  We recommend signing up for the patient portal called "MyChart".  Sign up information is provided on this After Visit Summary.  MyChart is used to connect with patients for Virtual Visits (Telemedicine).  Patients are able to view lab/test results, encounter notes, upcoming appointments, etc.  Non-urgent messages can be sent to your provider as well.   To learn more about what you can do with MyChart, go to [email or web address].    Your next appointment:   6 month(s)  The format for your next appointment:   In Person  Provider:   You may see Skeet Latch, MD or one of the following Advanced Practice Providers on your designated Care Team:    Kerin Ransom, PA-C  [place], [place]  Coletta Memos, [place]  Other Instructions TRY TO EXERCISE DAILY

## 2020-04-10 NOTE — Progress Notes (Signed)
Cardiology Office Note   Date:  04/10/2020   ID:  Melissa [name], DOB July 25, 1951, MRN [id number]  PCP:  Josetta Huddle, MD  Cardiologist:   Skeet Latch, MD   No chief complaint on file.   History of Present Illness:  Melissa [name] is a 69 y.o. female with asymptomatic coronary calcification, hyperlipidemia, and recurrent breast cancer status post lumpectomy and XRT who presents for follow up.   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 She has several family members who died of cardiovascular disease and her brother had recently died from a heart attack. Her father had a heart attack in his 57s and several paternal uncles also had heart attacks in their 61s. She denied chest pain but did report exertional dyspnea.  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  Exercise Cardiolite 06/2015 was negative for ischemia.  She achieved 8.5 METS. 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  She had proximal LAD plaque.  She was started on rosuvastatin.  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  At her last minute she was doing well.  Rosuvastatin was reduced to every other day due to myalgias.  She previously reported occasional episodes of heart fluttering that lasted 10 to 15 seconds.  Given that the episodes and ambulatory monitor was placed at that time.  She has been struggling with pain in her R knee.  It has gotten worse since she had her L knee replaced.  She has not been exercising as much lately.  She has been under a lot of stress and hasn't had time.  She notes that she is more short of breath when going up stairs but has no chest pain.  She attributes this to weight gain (8lb in August) and inactivity.  She denies lower extremity edema, orthopnea, or PND.  She notes that she has been stress eating.   She is otherwise well.  She has not experienced any more palpitations.   Past Medical History:  Diagnosis Date  . Anemia   . AR (aortic regurgitation) 06/2015   Trivial, Noted on ECHO  . Breast cancer ([place]) 07/23/97   left, tx w/xrt, Tamoxifen x 5 yrs  . Chronic pain    left knee  . Coronary artery calcification 04/10/2020   Mild proximal LAD seen on coronary CT-A in 2018  . DDD (degenerative disc disease), lumbar 04/05/2018   xray  . Diastolic dysfunction without heart failure 09/01/2015   Grade 1 diastolic dysfunction on echo 06/2015  . Diverticulitis    pt unaware  . DVT complicating pregnancy    Right calf, surface  . History of bladder infections   . History of endometriosis   . Hx of radiation therapy 08/22/97 - 10/03/97   left breast  . MR (mitral regurgitation) 06/2015   Noted on ECHO  . OA (osteoarthritis)    Bilateral Knees  . Obesity   . Obesity (BMI 30-39.9) 04/10/2020  . Personal history of radiation therapy   . Pure hypercholesterolemia 04/10/2020  . Recurrent breast cancer ([place]) 01/04/12   biopsy, ER/PR+, Her 2 -  . Shortness of breath 06/26/2015, 09/07/2016  . TR (tricuspid regurgitation) 06/2015   Noted on ECHO    Past Surgical History:  Procedure Laterality Date  . ABDOMINAL HYSTERECTOMY  1987  endometriosos  . BREAST BIOPSY Left 01/04/2012  . BREAST LUMPECTOMY  02/14/2012   LUMPECTOMY;  Surgeon: Haywood Lasso, MD;  Location: [place];  Service: General;  Laterality: Left;  . [place]   lumpectomy-left  . CESAREAN SECTION     x 3  . CHOLECYSTECTOMY  1993  . COLONOSCOPY    . HERNIA REPAIR  6503   umbilical   . ovary removed  1987  . TOTAL KNEE ARTHROPLASTY Left 07/10/2018   Procedure: LEFT TOTAL KNEE ARTHROPLASTY;  Surgeon: Gaynelle Arabian, MD;  Location: [facility];  Service: Orthopedics;  Laterality: Left;  20min     Current Outpatient Medications  Medication Sig Dispense Refill  . cephALEXin (KEFLEX) 250 MG capsule Take 250 mg by  mouth 3 (three) times a week.    . cholecalciferol (VITAMIN D3) 25 MCG (1000 UT) tablet Take 1,000 Units by mouth daily.    Marland Kitchen estradiol (ESTRACE) 0.1 MG/GM vaginal cream Place 5 g vaginally once a week. Once symptoms improve, decrease frequency of esterase to once weekly and further decrease based on symptoms    . letrozole (FEMARA) 2.5 MG tablet TAKE 1 TABLET BY MOUTH DAILY. 90 tablet 1  . rosuvastatin (CRESTOR) 10 MG tablet TAKE 1 TABLET BY MOUTH DAILY. 90 tablet 3   No current facility-administered medications for this visit.    Allergies:   Patient has no known allergies.    Social History:  The patient  reports that she has never smoked. She has never used smokeless tobacco. She reports that she does not drink alcohol and does not use drugs.   Family History:  The patient's family history includes Breast cancer in her maternal aunt; Cancer in her maternal aunt, maternal aunt, maternal grandfather, and paternal aunt; Cancer (age of onset: 67) in her mother; Heart attack in her brother, father, and paternal grandfather; Heart failure in her sister; Prostate cancer in her maternal uncle.    ROS:  Please see the history of present illness.   Otherwise, review of systems are positive for none.   All other systems are reviewed and negative.    PHYSICAL EXAM: VS:  BP 110/72   Pulse 65   Ht 5\' 6"  (1.676 m)   Wt 238 lb (108 kg)   SpO2 97%   BMI 38.41 kg/m  , BMI Body mass index is 38.41 kg/m. GENERAL:  Well appearing HEENT: Pupils equal round and reactive, fundi not visualized, oral mucosa unremarkable NECK:  No jugular venous distention, waveform within normal limits, carotid upstroke brisk and symmetric, no bruits LUNGS:  Clear to auscultation bilaterally HEART:  RRR.  PMI not displaced or sustained,S1 and S2 within normal limits, no S3, no S4, no clicks, no rubs, no murmurs ABD:  Flat, positive bowel sounds normal in frequency in pitch, no bruits, no rebound, no guarding, no midline  pulsatile mass, no hepatomegaly, no splenomegaly EXT:  2 plus pulses throughout, no edema, no cyanosis no clubbing SKIN:  No rashes no nodules NEURO:  Cranial nerves II through XII grossly intact, motor grossly intact throughout PSYCH:  Cognitively intact, oriented to person place and time   EKG:  EKG is ordered today. 09/07/16: Sinus rhythm.  Rate 71.  LAFB.  Diffuse TWI anterolateral and inferior TWI 10/05/17: Sinus rhythm.  Rate 62 bpm.  Anterolateral and inferior T wave inversions.  04/03/19: Sinus rhythm.  Rate 70 bpm.   Anterolateral T wave inversions.  Unchanged from prior. 04/10/2020: Sinus rhythm.  Rate 65  bpm.  Anterolateral lateral T wave inversions unchanged from prior.  Echo 07/08/15: Study Conclusions  - Left ventricle: The cavity size was normal. Wall thickness was normal. Systolic function was normal. The estimated ejection fraction was in the range of 55% to 60%. Wall motion was normal; there were no regional wall motion abnormalities. Doppler parameters are consistent with abnormal left ventricular relaxation (grade 1 diastolic dysfunction). - Aortic valve: There was no stenosis. There was trivial regurgitation. - Aorta: Mildly dilated aortic root. Aortic root dimension: 38 mm (ED). - Mitral valve: There was mild regurgitation. - Right ventricle: The cavity size was normal. Systolic function was normal. - Tricuspid valve: Peak RV-RA gradient (S): 29 mm Hg. - Pulmonary arteries: PA peak pressure: 32 mm Hg (S). - Inferior vena cava: The vessel was normal in size. The respirophasic diameter changes were in the normal range (>= 50%), consistent with normal central venous pressure.  Impressions:  - Normal LV size with EF 55-60%. Normal RV size and systolic function. Mild mitral regurgitation and mild tricuspid regurgitation.  Exercise Cardiolite 07/16/15:  The left ventricular ejection fraction is normal (55-65%).  Nuclear stress EF:  57%.  There was no ST segment deviation noted during stress.  This is a low risk study.  Low risk stress nuclear study with a small, mild, fixed apical defect consistent with soft tissue attenuation/apical thinning; no ischemia; EF 57 with normal wall motion.  Coronary calcium score 09/23/16: IMPRESSION: 1. Coronary artery calcium score 5.8 Agatston units. This places the patient in the 59th percentile for age and gender. This suggests intermediate risk for future cardiac events.  2. Mixed plaque in proximal LAD without significant stenosis. No other coronary disease noted.  Recent Labs: No results found for requested labs within last 8760 hours.    Lipid Panel    Component Value Date/Time   CHOL 145 04/03/2019 1118   TRIG 50 04/03/2019 1118   HDL 67 04/03/2019 1118   CHOLHDL 2.2 04/03/2019 1118   LDLCALC 67 04/03/2019 1118   08/26/14: Chol 181, HDL 62, LDL 98  08/27/16: Cholesterol 179, HDL 65, LDL 100, triglycerides 67 Sodium 142, potassium 4.8, BUN 14, creatinine 0.96 AST 18, ALT 19  09/05/17:  Sodium 141, potassium 4.5, BUN 19, creatinine 0.93 AST 20, ALT 18 Total cholesterol 190, triglycerides 57, HDL 60, LDL 119 TSH 1.19.  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  She attributes this to significant weight gain and being with physically active.  We will give her 1 month to get back into her exercise routine.  If her dyspnea does not improve then we will need to do an ischemia evaluation.  She has no evidence of heart failure on exam.  Lipids are at goal.  Continue  rosuvastatin.  # Palpitations: Resolved.  Thyroid, blood counts, and electrolytes are normal.  # Diastolic dysfunction: Echo revealed grade 1 diastolic dysfunction.  She has no evidence of heart failure on exam.   # Obesity: Mrs. Topp will get back into her exercise routine and work on her diet.    Current  medicines are reviewed at length with the patient today.  The patient does not have concerns regarding medicines.  The following changes have been made:  no change   Labs/ tests ordered today include:   Orders Placed This Encounter  Procedures  . EKG 12-Lead     Disposition:   FU with Kinney Sackmann C. Oval Linsey, MD, [facility] in 6 months.   Signed, Vegas Fritze C. Oval Linsey, MD, [facility]  04/10/2020 9:05 AM    [place]

## 2020-05-01 MED FILL — LETROZOLE 2.5 MG TABLET: 2.5 | 90 days supply | Qty: 90 | Fill #1

## 2020-05-06 ENCOUNTER — Ambulatory Visit: Payer: 59 | Attending: Critical Care Medicine

## 2020-05-06 DIAGNOSIS — Z23 Encounter for immunization: Secondary | ICD-10-CM

## 2020-05-06 NOTE — Progress Notes (Signed)
   Covid-19 Vaccination Clinic  Name:  Melissa [name]    MRN: [id number] DOB: 09-06-1950  05/06/2020  [name] was observed post Covid-19 immunization for 15 minutes without incident. She was provided with Vaccine Information Sheet and instruction to access the V-Safe system.   [name] was instructed to call 911 with any severe reactions post vaccine: Marland Kitchen Difficulty breathing  . Swelling of face and throat  . A fast heartbeat  . A bad rash all over body  . Dizziness and weakness

## 2020-06-30 MED FILL — CEPHALEXIN 250 MG CAPS: 250 | 18 days supply | Qty: 8 | Fill #2

## 2020-07-17 ENCOUNTER — Other Ambulatory Visit: Payer: Self-pay | Admitting: Cardiovascular Disease

## 2020-07-17 MED FILL — ROSUVASTATIN CALCIUM 10 MG: 10 | 90 days supply | Qty: 90 | Fill #0

## 2020-07-17 MED FILL — CEPHALEXIN 250 MG CAPS: 250 | 84 days supply | Qty: 36 | Fill #0

## 2020-08-25 ENCOUNTER — Other Ambulatory Visit: Payer: Self-pay | Admitting: Oncology

## 2020-08-25 MED FILL — LETROZOLE 2.5 MG TABLET: 2.5 | 90 days supply | Qty: 90 | Fill #0

## 2020-08-25 NOTE — Telephone Encounter (Signed)
Received refill request for Letrozole 2.5 mg from [facility].

## 2020-10-06 ENCOUNTER — Encounter: Payer: Self-pay | Admitting: Cardiovascular Disease

## 2020-10-06 ENCOUNTER — Telehealth (INDEPENDENT_AMBULATORY_CARE_PROVIDER_SITE_OTHER): Payer: 59 | Admitting: Cardiovascular Disease

## 2020-10-06 DIAGNOSIS — Z6838 Body mass index (BMI) 38.0-38.9, adult: Secondary | ICD-10-CM | POA: Diagnosis not present

## 2020-10-06 DIAGNOSIS — I5032 Chronic diastolic (congestive) heart failure: Secondary | ICD-10-CM

## 2020-10-06 DIAGNOSIS — E7800 Pure hypercholesterolemia, unspecified: Secondary | ICD-10-CM

## 2020-10-06 DIAGNOSIS — R0602 Shortness of breath: Secondary | ICD-10-CM | POA: Diagnosis not present

## 2020-10-06 DIAGNOSIS — E66812 Obesity, class 2: Secondary | ICD-10-CM

## 2020-10-06 NOTE — Progress Notes (Signed)
Virtual Visit via Video Note   This visit type was conducted due to national recommendations for restrictions regarding the COVID-19 Pandemic (e.g. social distancing) in an effort to limit this patient's exposure and mitigate transmission in our community.  Due to her co-morbid illnesses, this patient is at least at moderate risk for complications without adequate follow up.  This format is felt to be most appropriate for this patient at this time.  All issues noted in this document were discussed and addressed.  A limited physical exam was performed with this format.  Please refer to the patient's chart for her consent to telehealth for [facility].  The patient was identified using 2 identifiers.  Date:  10/07/2020   ID:  Melissa [name], DOB 09-15-1950, MRN [id number]  Patient Location: Home Provider Location: Home Office  PCP:  Josetta Huddle, MD  Cardiologist:  Skeet Latch, MD  Electrophysiologist:  None   Evaluation Performed:  Follow-Up Visit  Chief Complaint:  Coronary calcification  History of Present Illness:    The patient does not have symptoms concerning for COVID-19 infection (fever, chills, cough, or new shortness of breath).   Melissa [name] is a 70 y.o. female with asymptomatic coronary calcification, hyperlipidemia, and recurrent breast cancer status post lumpectomy and XRT who presents for follow up.   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 She has several family members who died of cardiovascular disease and her brother had recently died from a heart attack. Her father had a heart attack in his 35s and several paternal uncles also had heart attacks in their 13s. She denied chest pain but did report exertional dyspnea.  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  Exercise Cardiolite 06/2015 was negative for ischemia.  She achieved 8.5 METS. 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  She had proximal LAD plaque.  She was started on rosuvastatin.  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  At her last minute she was doing well.  Rosuvastatin was reduced to every other day due to myalgias.  She previously reported occasional episodes of heart fluttering that lasted 10 to 15 seconds.  Given that the episodes were sporadic, an ambulatory monitor was not placed at that time.  Lately she has been feeling well.  She has started exercising more regularly.  She is doing it at least 4 days/week and notes improvement in her breathing.  She denies any exertional chest pain or pressure.  She does struggle with intermittent heartburn.  It always occurs after eating and never with exertion.  She has not noticed an association with any particular foods.  She has some exertional intolerance in her left arm which she attributes to her treatment for breast cancer.  She otherwise denies myalgias.  She has no lower extremity edema, orthopnea, or PND.  She is frustrated that she struggles to lose weight despite exercising more.  She has been working on her diet and trying to limit carbohydrates and snacks.  She struggles with accountability.  She does not check her weight or blood pressure at home but notes that it was 112/70 when she saw the dentist a few weeks ago.   Past Medical History:  Diagnosis Date  . Anemia   . AR (aortic regurgitation) 06/2015   Trivial, Noted on  ECHO  . Breast cancer ([place]) 07/23/97   left, tx w/xrt, Tamoxifen x 5 yrs  . Chronic pain    left knee  . Coronary artery calcification 04/10/2020   Mild proximal LAD seen on coronary CT-A in 2018  . DDD (degenerative disc disease), lumbar 04/05/2018   xray  . Diastolic dysfunction without heart failure 09/01/2015   Grade 1 diastolic dysfunction on echo 06/2015  . Diverticulitis    pt unaware  . DVT complicating  pregnancy    Right calf, surface  . History of bladder infections   . History of endometriosis   . Hx of radiation therapy 08/22/97 - 10/03/97   left breast  . MR (mitral regurgitation) 06/2015   Noted on ECHO  . OA (osteoarthritis)    Bilateral Knees  . Obesity   . Obesity (BMI 30-39.9) 04/10/2020  . Personal history of radiation therapy   . Pure hypercholesterolemia 04/10/2020  . Recurrent breast cancer ([place]) 01/04/12   biopsy, ER/PR+, Her 2 -  . Shortness of breath 06/26/2015, 09/07/2016  . TR (tricuspid regurgitation) 06/2015   Noted on ECHO    Past Surgical History:  Procedure Laterality Date  . ABDOMINAL HYSTERECTOMY  1987   endometriosos  . BREAST BIOPSY Left 01/04/2012  . BREAST LUMPECTOMY  02/14/2012   LUMPECTOMY;  Surgeon: Haywood Lasso, MD;  Location: [place];  Service: General;  Laterality: Left;  . [place]   lumpectomy-left  . CESAREAN SECTION     x 3  . CHOLECYSTECTOMY  1993  . COLONOSCOPY    . HERNIA REPAIR  8416   umbilical   . ovary removed  1987  . TOTAL KNEE ARTHROPLASTY Left 07/10/2018   Procedure: LEFT TOTAL KNEE ARTHROPLASTY;  Surgeon: Gaynelle Arabian, MD;  [facility];  Service: Orthopedics;  Laterality: Left;  77min     Current Outpatient Medications  Medication Sig Dispense Refill  . cephALEXin (KEFLEX) 250 MG capsule Take 250 mg by mouth 3 (three) times a week.    . cholecalciferol (VITAMIN D3) 25 MCG (1000 UT) tablet Take 1,000 Units by mouth daily.    Marland Kitchen estradiol (ESTRACE) 0.1 MG/GM vaginal cream Place 5 g vaginally once a week. Once symptoms improve, decrease frequency of esterase to once weekly and further decrease based on symptoms    . letrozole (FEMARA) 2.5 MG tablet TAKE 1 TABLET BY MOUTH DAILY. 90 tablet 1  . rosuvastatin (CRESTOR) 10 MG tablet Take 10 mg by mouth daily.     No current facility-administered medications for this visit.    Allergies:   Patient has no known allergies.    Social History:  The patient   reports that she has never smoked. She has never used smokeless tobacco. She reports that she does not drink alcohol and does not use drugs.   Family History:  The patient's family history includes Breast cancer in her maternal aunt; Cancer in her maternal aunt, maternal aunt, maternal grandfather, and paternal aunt; Cancer (age of onset: 33) in her mother; Heart attack in her brother, father, and paternal grandfather; Heart failure in her sister; Prostate cancer in her maternal uncle.    ROS:  Please see the history of present illness.   Otherwise, review of systems are positive for none.   All other systems are reviewed and negative.    PHYSICAL EXAM: There were no vitals taken for this visit. GENERAL: Well-appearing.  No acute distress. HEENT: Pupils equal round.  Oral mucosa unremarkable NECK:  No jugular venous distention, no visible thyromegaly EXT:  No edema, no cyanosis no clubbing SKIN:  No rashes no nodules NEURO:  Speech fluent.  Cranial nerves grossly intact.  Moves all 4 extremities freely PSYCH:  Cognitively intact, oriented to person place and time  EKG:  EKG is not ordered today. 09/07/16: Sinus rhythm.  Rate 71.  LAFB.  Diffuse TWI anterolateral and inferior TWI 10/05/17: Sinus rhythm.  Rate 62 bpm.  Anterolateral and inferior T wave inversions.  04/03/19: Sinus rhythm.  Rate 70 bpm.   Anterolateral T wave inversions.  Unchanged from prior. 04/10/2020: Sinus rhythm.  Rate 65 bpm.  Anterolateral lateral T wave inversions unchanged from prior.  Echo 07/08/15: Study Conclusions  - Left ventricle: The cavity size was normal. Wall thickness was normal. Systolic function was normal. The estimated ejection fraction was in the range of 55% to 60%. Wall motion was normal; there were no regional wall motion abnormalities. Doppler parameters are consistent with abnormal left ventricular relaxation (grade 1 diastolic dysfunction). - Aortic valve: There was no stenosis.  There was trivial regurgitation. - Aorta: Mildly dilated aortic root. Aortic root dimension: 38 mm (ED). - Mitral valve: There was mild regurgitation. - Right ventricle: The cavity size was normal. Systolic function was normal. - Tricuspid valve: Peak RV-RA gradient (S): 29 mm Hg. - Pulmonary arteries: PA peak pressure: 32 mm Hg (S). - Inferior vena cava: The vessel was normal in size. The respirophasic diameter changes were in the normal range (>= 50%), consistent with normal central venous pressure.  Impressions:  - Normal LV size with EF 55-60%. Normal RV size and systolic function. Mild mitral regurgitation and mild tricuspid regurgitation.  Exercise Cardiolite 07/16/15:  The left ventricular ejection fraction is normal (55-65%).  Nuclear stress EF: 57%.  There was no ST segment deviation noted during stress.  This is a low risk study.  Low risk stress nuclear study with a small, mild, fixed apical defect consistent with soft tissue attenuation/apical thinning; no ischemia; EF 57 with normal wall motion.  Coronary calcium score 09/23/16: IMPRESSION: 1. Coronary artery calcium score 5.8 Agatston units. This places the patient in the 59th percentile for age and gender. This suggests intermediate risk for future cardiac events.  2. Mixed plaque in proximal LAD without significant stenosis. No other coronary disease noted.  Recent Labs: No results found for requested labs within last 8760 hours.    Lipid Panel    Component Value Date/Time   CHOL 145 04/03/2019 1118   TRIG 50 04/03/2019 1118   HDL 67 04/03/2019 1118   CHOLHDL 2.2 04/03/2019 1118   LDLCALC 67 04/03/2019 1118   08/26/14: Chol 181, HDL 62, LDL 98  08/27/16: Cholesterol 179, HDL 65, LDL 100, triglycerides 67 Sodium 142, potassium 4.8, BUN 14, creatinine 0.96 AST 18, ALT 19  09/05/17:  Sodium 141, potassium 4.5, BUN 19, creatinine 0.93 AST 20, ALT 18 Total cholesterol 190,  triglycerides 57, HDL 60, LDL 119 TSH 1.19.  Free T4 0.92  10/29/2019: Total cholesterol 132, triglycerides 58, HDL 64, LDL 55 Hemoglobin 13.2 Creatinine 0.93, potassium 4.9 TSH 1.49 ALT 14   Wt Readings from Last 3 Encounters:  04/10/20 238 lb (108 kg)  04/07/20 236 lb (107 kg)  04/06/19 231 lb 14.4 oz (105.2 kg)      ASSESSMENT AND PLAN:  # Asymptomatic coronary calcification: # Hyperlipidemia: # Shortness of breath: Shortness of breath improving with exercise.  Lipids were at goal when last checked but it has  been over a year.  She is due to see her PCP next month.  She will ask them to send a copy of her records.  She will continue with rosuvastatin at current dosing.  # Palpitations: Resolved.  Thyroid, blood counts, and electrolytes are normal.  # Diastolic dysfunction: Echo revealed grade 1 diastolic dysfunction.  She has no evidence of heart failure on exam.   # Obesity: Mrs. Stangeland continues to struggle with weight loss.  She will continue exercising and we will refer her to the healthy weight and wellness clinic.    Current medicines are reviewed at length with the patient today.  The patient does not have concerns regarding medicines.  The following changes have been made:  no change   Labs/ tests ordered today include:   Orders Placed This Encounter  Procedures  . Ambulatory referral to [facility]    COVID-19 Education: The signs and symptoms of COVID-19 were discussed with the patient and how to seek care for testing (follow up with PCP or arrange E-visit).  The importance of social distancing was discussed today.  Time:   Today, I have spent 25 minutes with the patient with telehealth technology discussing the above problems.    Disposition:   FU with Tiffany C. Oval Linsey, MD, [facility] in 1 year   Signed, Tiffany C. Oval Linsey, [place], [facility]  10/07/2020 10:24 AM    [place]

## 2020-10-06 NOTE — Patient Instructions (Addendum)
Medication Instructions:  Your physician recommends that you continue on your current medications as directed. Please refer to the Current Medication list given to you today.  *If you need a refill on your cardiac medications before your next appointment, please call your pharmacy*  Lab Work: HAVE YOUR PRIMARY CARE FAX LABS AFTER YOU HAVE COMPLETED  Testing/Procedures: NONE   Follow-Up: At [facility], you and your health needs are our priority.  As part of our continuing mission to provide you with exceptional heart care, we have created designated Provider Care Teams.  These Care Teams include your primary Cardiologist (physician) and Advanced Practice Providers (APPs -  Physician Assistants and Nurse Practitioners) who all work together to provide you with the care you need, when you need it.  We recommend signing up for the patient portal called "MyChart".  Sign up information is provided on this After Visit Summary.  MyChart is used to connect with patients for Virtual Visits (Telemedicine).  Patients are able to view lab/test results, encounter notes, upcoming appointments, etc.  Non-urgent messages can be sent to your provider as well.   To learn more about what you can do with MyChart, go to [email or web address].    Your next appointment:   12 month(s)  The format for your next appointment:   In Person  Provider:   You may see Skeet Latch, MD   or one of the following Advanced Practice Providers on your designated Care Team:    Sande Rives, PA-C  Coletta Memos, FNP   You have been referred to    [facility] referral to [facility], MD)  Where: [place] Address: [address] [place] [address] Phone: [phone]  If you do not hear from them in 2 weeks you can call the office directly at the number listed above  Other Instructions  COME TO [place], ASK FOR  DR [facility]

## 2020-10-06 NOTE — Addendum Note (Signed)
Addended by: Alvina Filbert B on: 10/06/2020 07:14 PM   Modules accepted: Orders

## 2020-10-06 NOTE — Progress Notes (Signed)
error 

## 2020-10-09 ENCOUNTER — Ambulatory Visit: Payer: 59 | Admitting: Cardiovascular Disease

## 2020-10-17 ENCOUNTER — Other Ambulatory Visit (HOSPITAL_COMMUNITY): Payer: Self-pay | Admitting: Internal Medicine

## 2020-10-17 MED FILL — CEPHALEXIN 250 MG CAPS: 250 | 9 days supply | Qty: 4 | Fill #1

## 2020-10-28 ENCOUNTER — Other Ambulatory Visit (HOSPITAL_COMMUNITY): Payer: Self-pay

## 2020-10-28 MED FILL — Cephalexin Cap 250 MG: ORAL | 84 days supply | Qty: 40 | Fill #0 | Status: AC

## 2020-11-11 ENCOUNTER — Ambulatory Visit (INDEPENDENT_AMBULATORY_CARE_PROVIDER_SITE_OTHER): Payer: 59 | Admitting: Family Medicine

## 2020-11-25 ENCOUNTER — Ambulatory Visit (INDEPENDENT_AMBULATORY_CARE_PROVIDER_SITE_OTHER): Payer: 59 | Admitting: Family Medicine

## 2020-12-05 ENCOUNTER — Other Ambulatory Visit (HOSPITAL_COMMUNITY): Payer: Self-pay

## 2020-12-05 MED FILL — Letrozole Tab 2.5 MG: ORAL | 90 days supply | Qty: 90 | Fill #0 | Status: AC

## 2020-12-09 ENCOUNTER — Ambulatory Visit (INDEPENDENT_AMBULATORY_CARE_PROVIDER_SITE_OTHER): Payer: Medicare Other | Admitting: Family Medicine

## 2020-12-09 ENCOUNTER — Encounter (INDEPENDENT_AMBULATORY_CARE_PROVIDER_SITE_OTHER): Payer: Self-pay

## 2020-12-09 ENCOUNTER — Other Ambulatory Visit: Payer: Self-pay

## 2020-12-09 ENCOUNTER — Encounter (INDEPENDENT_AMBULATORY_CARE_PROVIDER_SITE_OTHER): Payer: Self-pay | Admitting: Family Medicine

## 2020-12-09 ENCOUNTER — Ambulatory Visit (INDEPENDENT_AMBULATORY_CARE_PROVIDER_SITE_OTHER): Payer: 59 | Admitting: Family Medicine

## 2020-12-09 ENCOUNTER — Other Ambulatory Visit (INDEPENDENT_AMBULATORY_CARE_PROVIDER_SITE_OTHER): Payer: Self-pay | Admitting: Family Medicine

## 2020-12-09 VITALS — BP 112/73 | HR 97 | Temp 98.0°F | Ht 65.0 in | Wt 229.0 lb

## 2020-12-09 DIAGNOSIS — K5909 Other constipation: Secondary | ICD-10-CM | POA: Diagnosis not present

## 2020-12-09 DIAGNOSIS — M8949 Other hypertrophic osteoarthropathy, multiple sites: Secondary | ICD-10-CM | POA: Diagnosis not present

## 2020-12-09 DIAGNOSIS — Z9189 Other specified personal risk factors, not elsewhere classified: Secondary | ICD-10-CM

## 2020-12-09 DIAGNOSIS — R0602 Shortness of breath: Secondary | ICD-10-CM

## 2020-12-09 DIAGNOSIS — E65 Localized adiposity: Secondary | ICD-10-CM

## 2020-12-09 DIAGNOSIS — E559 Vitamin D deficiency, unspecified: Secondary | ICD-10-CM

## 2020-12-09 DIAGNOSIS — C50919 Malignant neoplasm of unspecified site of unspecified female breast: Secondary | ICD-10-CM

## 2020-12-09 DIAGNOSIS — E7800 Pure hypercholesterolemia, unspecified: Secondary | ICD-10-CM

## 2020-12-09 DIAGNOSIS — R7301 Impaired fasting glucose: Secondary | ICD-10-CM | POA: Diagnosis not present

## 2020-12-09 DIAGNOSIS — Z6838 Body mass index (BMI) 38.0-38.9, adult: Secondary | ICD-10-CM

## 2020-12-09 DIAGNOSIS — D649 Anemia, unspecified: Secondary | ICD-10-CM | POA: Diagnosis not present

## 2020-12-09 DIAGNOSIS — R0683 Snoring: Secondary | ICD-10-CM

## 2020-12-09 DIAGNOSIS — F3289 Other specified depressive episodes: Secondary | ICD-10-CM

## 2020-12-09 DIAGNOSIS — I5189 Other ill-defined heart diseases: Secondary | ICD-10-CM

## 2020-12-09 DIAGNOSIS — M1990 Unspecified osteoarthritis, unspecified site: Secondary | ICD-10-CM

## 2020-12-09 DIAGNOSIS — R5383 Other fatigue: Secondary | ICD-10-CM

## 2020-12-09 DIAGNOSIS — E7849 Other hyperlipidemia: Secondary | ICD-10-CM

## 2020-12-09 DIAGNOSIS — M159 Polyosteoarthritis, unspecified: Secondary | ICD-10-CM

## 2020-12-09 DIAGNOSIS — Z0289 Encounter for other administrative examinations: Secondary | ICD-10-CM

## 2020-12-09 DIAGNOSIS — R635 Abnormal weight gain: Secondary | ICD-10-CM

## 2020-12-10 LAB — CBC WITH DIFFERENTIAL/PLATELET
Basophils Absolute: 0.1 10*3/uL (ref 0.0–0.2)
Basos: 1 %
EOS (ABSOLUTE): 0.1 10*3/uL (ref 0.0–0.4)
Eos: 1 %
Hematocrit: 45.9 % (ref 34.0–46.6)
Hemoglobin: 14.8 g/dL (ref 11.1–15.9)
Immature Grans (Abs): 0 10*3/uL (ref 0.0–0.1)
Immature Granulocytes: 0 %
Lymphocytes Absolute: 2 10*3/uL (ref 0.7–3.1)
Lymphs: 44 %
MCH: 27.4 pg (ref 26.6–33.0)
MCHC: 32.2 g/dL (ref 31.5–35.7)
MCV: 85 fL (ref 79–97)
Monocytes Absolute: 0.3 10*3/uL (ref 0.1–0.9)
Monocytes: 6 %
Neutrophils Absolute: 2.2 10*3/uL (ref 1.4–7.0)
Neutrophils: 48 %
Platelets: 228 10*3/uL (ref 150–450)
RBC: 5.4 x10E6/uL — ABNORMAL HIGH (ref 3.77–5.28)
RDW: 13.9 % (ref 11.7–15.4)
WBC: 4.5 10*3/uL (ref 3.4–10.8)

## 2020-12-10 LAB — ANEMIA PANEL
Ferritin: 235 ng/mL — ABNORMAL HIGH (ref 15–150)
Folate, Hemolysate: 301 ng/mL
Folate, RBC: 653 ng/mL (ref >498)
Hematocrit: 46.1 % (ref 34.0–46.6)
Iron Saturation: 28 % (ref 15–55)
Iron: 102 ug/dL (ref 27–139)
Retic Ct Pct: 1.5 % (ref 0.6–2.6)
Total Iron Binding Capacity: 362 ug/dL (ref 250–450)
UIBC: 260 ug/dL (ref 118–369)
Vitamin B-12: 538 pg/mL (ref 232–1245)

## 2020-12-10 LAB — COMPREHENSIVE METABOLIC PANEL
ALT: 17 IU/L (ref 0–32)
AST: 16 IU/L (ref 0–40)
Albumin/Globulin Ratio: 1.8 (ref 1.2–2.2)
Albumin: 4.5 g/dL (ref 3.8–4.8)
Alkaline Phosphatase: 120 IU/L (ref 44–121)
BUN/Creatinine Ratio: 15 (ref 12–28)
BUN: 14 mg/dL (ref 8–27)
Bilirubin Total: 0.5 mg/dL (ref 0.0–1.2)
CO2: 23 mmol/L (ref 20–29)
Calcium: 9.7 mg/dL (ref 8.7–10.3)
Chloride: 101 mmol/L (ref 96–106)
Creatinine, Ser: 0.96 mg/dL (ref 0.57–1.00)
Globulin, Total: 2.5 g/dL (ref 1.5–4.5)
Glucose: 100 mg/dL — ABNORMAL HIGH (ref 65–99)
Potassium: 4.7 mmol/L (ref 3.5–5.2)
Sodium: 141 mmol/L (ref 134–144)
Total Protein: 7 g/dL (ref 6.0–8.5)
eGFR: 64 mL/min/{1.73_m2} (ref >59)

## 2020-12-10 LAB — LIPID PANEL
Chol/HDL Ratio: 2.3 ratio (ref 0.0–4.4)
Cholesterol, Total: 155 mg/dL (ref 100–199)
HDL: 68 mg/dL (ref >39)
LDL Chol Calc (NIH): 75 mg/dL (ref 0–99)
Triglycerides: 59 mg/dL (ref 0–149)
VLDL Cholesterol Cal: 12 mg/dL (ref 5–40)

## 2020-12-10 LAB — HEMOGLOBIN A1C
Est. average glucose Bld gHb Est-mCnc: 140 mg/dL
Hgb A1c MFr Bld: 6.5 % — ABNORMAL HIGH (ref 4.8–5.6)

## 2020-12-10 LAB — TSH: TSH: 1.34 u[IU]/mL (ref 0.450–4.500)

## 2020-12-10 LAB — T4, FREE: Free T4: 1.37 ng/dL (ref 0.82–1.77)

## 2020-12-10 LAB — INSULIN, RANDOM: INSULIN: 18.7 u[IU]/mL (ref 2.6–24.9)

## 2020-12-10 LAB — VITAMIN D 25 HYDROXY (VIT D DEFICIENCY, FRACTURES): Vit D, 25-Hydroxy: 47.3 ng/mL (ref 30.0–100.0)

## 2020-12-15 NOTE — Progress Notes (Signed)
Dear Melissa [name],   Thank you for referring Melissa [name] to our clinic. The following note includes my evaluation and treatment recommendations.  Chief Complaint:   Melissa [name] (MR# [id number]) is a 70 y.o. female who presents for evaluation and treatment of obesity and related comorbidities. Current BMI is Body mass index is 38.11 kg/m. Melissa [name] has been struggling with her weight for many years and has been unsuccessful in either losing weight, maintaining weight loss, or reaching her healthy weight goal.  Melissa [name] is currently in the action stage of change and ready to dedicate time achieving and maintaining a healthier weight. Melissa [name] is interested in becoming our patient and working on intensive lifestyle modifications including (but not limited to) diet and exercise for weight loss.  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 She exercises regularly.  Her PCP is Dr. Inda [name] at [place].  Her goal weight is 170s-180s (last time at this weight around 15 years ago).  Melissa [name]'s habits were reviewed today and are as follows: Her family eats meals together, she thinks her family will eat healthier with her, her desired weight loss is 58 pounds, she started gaining weight after her last pregnancy, her heaviest weight ever was 234 pounds, she craves sweets, she snacks frequently in the evenings, she skips breakfast frequently, she is frequently drinking liquids with calories, she frequently eats larger portions than normal and she struggles with emotional eating.  Depression Screen Melissa [name] (modified PHQ-9) score was 9.  Depression screen PHQ 2/9 12/09/2020  Decreased Interest 1  Down, Depressed, Hopeless 1  PHQ - 2 Score 2  Altered sleeping 1  Tired, decreased energy 2  Change in appetite 2  Feeling bad or failure about yourself  1  Trouble concentrating 1  Moving slowly or fidgety/restless 0  Suicidal thoughts 0  PHQ-9 Score 9   Difficult doing work/chores Not difficult at all   Assessment/Plan:   Orders Placed This Encounter  Procedures  . Anemia panel  . Comprehensive metabolic panel  . CBC with Differential/Platelet  . Hemoglobin A1c  . Insulin, random  . Lipid panel  . VITAMIN D 25 Hydroxy (Vit-D Deficiency, Fractures)  . TSH  . T4, free  . EKG 12-Lead    1. Other fatigue Melissa [name] admits to daytime somnolence and reports waking up still tired. Patent has a history of symptoms of daytime fatigue, morning fatigue and snoring. Melissa [name] generally gets 5-7 hours of sleep per night, and states that she has generally restful sleep. Snoring is present. Apneic episodes are not present. Epworth Sleepiness Score is 5.  Melissa [name] does feel that her weight is causing her energy to be lower than it should be. Fatigue may be related to obesity, depression or many other causes. Labs will be ordered, and in the meanwhile, Melissa [name] will focus on self care including making healthy food choices, increasing physical activity and focusing on stress reduction.  - EKG 12-Lead - Anemia panel - Comprehensive metabolic panel - CBC with Differential/Platelet - TSH - T4, free  2. SOB (shortness of breath) on exertion Melissa [name] notes increasing shortness of breath with exercising and seems to be worsening over time with weight gain. She notes getting out of breath sooner with activity than she used to. This has gotten worse recently. Melissa [name] denies shortness of breath at rest or orthopnea.  Melissa [name] does feel that she gets out of breath more easily that she used  to when she exercises. Melissa [name] shortness of breath appears to be obesity related and exercise induced. She has agreed to work on weight loss and gradually increase exercise to treat her exercise induced shortness of breath. Will continue to monitor closely.  3. Visceral obesity Current visceral fat rating: 16. Visceral fat rating should be < 13. Visceral adipose tissue is a hormonally  active component of total body fat. This body composition phenotype is associated with medical disorders such as metabolic syndrome, cardiovascular disease and several malignancies including prostate, breast, and colorectal cancers. Starting goal: Lose 7-10% of starting weight.   4. Osteoarthritis Will follow because mobility and pain control are important for weight management.   5. Recurrent malignant neoplasm of breast ([place]) In remission.  Previously on tamoxifen (1999, 2013), now letrozole.  Hitting 10th soon. We will continue to monitor symptoms as they relate to her weight loss journey.  6. Other hyperlipidemia Course: Controlled. Lipid-lowering medications: Crestor 10 mg daily.   Plan:  Continue Crestor. Dietary changes: Increase soluble fiber, decrease simple carbohydrates, decrease saturated fat. Exercise changes: Moderate to vigorous-intensity aerobic activity 150 minutes per week or as tolerated. We will continue to monitor along with PCP/specialists as it pertains to her weight loss journey.  Will check lipid panel today, as per below.  Lab Results  Component Value Date   CHOL 155 12/09/2020   HDL 68 12/09/2020   LDLCALC 75 12/09/2020   TRIG 59 12/09/2020   CHOLHDL 2.3 12/09/2020   Lab Results  Component Value Date   ALT 17 12/09/2020   AST 16 12/09/2020   ALKPHOS 120 12/09/2020   BILITOT 0.5 12/09/2020   The 10-year ASCVD risk score Mikey Bussing DC Jr., et al., 2013) is: 6%   Values used to calculate the score:     Age: 84 years     Sex: Female     Is Non-Hispanic African American: Yes     Diabetic: No     Tobacco smoker: No     Systolic Blood Pressure: [id number] mmHg     Is BP treated: No     HDL Cholesterol: 68 mg/dL     Total Cholesterol: 155 mg/dL  - Lipid panel  7. Vitamin D deficiency Jazzmon is taking OTC vitamin D 1,000 IU daily.  Optimal goal > 50 ng/dL.   Plan:  Continue current OTC vitamin D supplement at this time.  We will check vitamin D level today.   -  VITAMIN D 25 Hydroxy (Vit-D Deficiency, Fractures)  8. Other constipation This problem is moderately controlled. [place] was informed that a decrease in bowel movement frequency is normal while losing weight, but stools should not be hard or painful.    Counseling: Getting to Good Bowel Health: Your goal is to have one soft bowel movement each day. Drink at least 8 glasses of water each day. Eat plenty of fiber (goal is over 30 grams each day). It is best to get most of your fiber from dietary sources which includes leafy green vegetables, fresh fruit, and whole grains. You may need to add fiber with the help of OTC fiber supplements. These include Metamucil, Citrucel, and Benefiber. If you are still having trouble, try adding an osmotic laxative such as Miralax. If all of these changes do not work, Cabin crew.   9. Anemia, unspecified type Prior to cancer diagnosis.    CBC Latest Ref Rng & Units 12/09/2020 12/09/2020 04/03/2019  WBC 3.4 - 10.8 x10E3/uL - 4.5 4.4  Hemoglobin  11.1 - 15.9 g/dL - 14.8 12.6  Hematocrit 34.0 - 46.6 % 46.1 45.9 38.7  Platelets 150 - 450 x10E3/uL - 228 229   Lab Results  Component Value Date   IRON 102 12/09/2020   TIBC 362 12/09/2020   FERRITIN 235 (H) 12/09/2020   10. Snores Melissa [name] endorses snoring.  Epworth sleepiness score is 5.  11. Abnormal weight gain Will check labs today and Melissa [name] will start her Category 2 meal plan.  12. Diastolic dysfunction without heart failure Melissa [name] is followed by Cardiology, and we will continue to follow along as it relates to her weight loss journey.  13. Impaired fasting glucose Will check A1c and insulin level today, as per below.  - Hemoglobin A1c - Insulin, random  14. Other depression, with emotional eating Not at goal. Medication: None.  Melissa [name] eats when stressed, bored, and for comfort.  She craves sweets, especially when bored.  Plan:  Behavior modification techniques were discussed today to help  deal with emotional/non-hunger eating behaviors.  15. At risk for heart disease Due to Melissa [name] current state of health and medical condition(s), she is at a higher risk for heart disease.  This puts the patient at much greater risk to subsequently develop cardiopulmonary conditions that can significantly affect patient's quality of life in a negative manner.    At least 8 minutes were spent on counseling Melissa [name] about these concerns today. Evidence-based interventions for health behavior change were utilized today including the discussion of self monitoring techniques, problem-solving barriers, and SMART goal setting techniques.  Specifically, regarding patient's less desirable eating habits and patterns, we employed the technique of small changes when Tameshia has not been able to fully commit to her prudent nutritional plan.  16. Class 2 severe obesity with serious comorbidity and body mass index (BMI) of 38.0 to 38.9 in adult, unspecified obesity type (HCC)  Melissa [name] is currently in the action stage of change and her goal is to continue with weight loss efforts. I recommend Melissa [name] begin the structured treatment plan as follows:  She has agreed to the Category 2 Plan +300 calories.  Exercise goals: As is.   Behavioral modification strategies: increasing lean protein intake, decreasing simple carbohydrates, increasing vegetables, increasing water intake, decreasing liquid calories, decreasing alcohol intake, decreasing sodium intake and increasing high fiber foods.  She was informed of the importance of frequent follow-up visits to maximize her success with intensive lifestyle modifications for her multiple health conditions. She was informed we would discuss her lab results at her next visit unless there is a critical issue that needs to be addressed sooner. Melissa [name] agreed to keep her next visit at the agreed upon time to discuss these results.  Objective:   Blood pressure 112/73, pulse 97,  temperature 98 F (36.7 C), temperature source Oral, height 5\' 5"  (1.651 m), weight 229 lb (103.9 kg), SpO2 98 %. Body mass index is 38.11 kg/m.  EKG: Normal sinus rhythm, rate 69 bpm.  Indirect Calorimeter completed today shows a VO2 of 265 and a REE of 1847.  Her calculated basal metabolic rate is 8756 thus her basal metabolic rate is better than expected.  General: Cooperative, alert, well developed, in no acute distress. HEENT: Conjunctivae and lids unremarkable. Cardiovascular: Regular rhythm.  Lungs: Normal work of breathing. Neurologic: No focal deficits.   Lab Results  Component Value Date   CREATININE 0.96 12/09/2020   BUN 14 12/09/2020   NA 141 12/09/2020   K 4.7 12/09/2020   CL 101 12/09/2020  CO2 23 12/09/2020   Lab Results  Component Value Date   ALT 17 12/09/2020   AST 16 12/09/2020   ALKPHOS 120 12/09/2020   BILITOT 0.5 12/09/2020   Lab Results  Component Value Date   HGBA1C 6.5 (H) 12/09/2020   Lab Results  Component Value Date   INSULIN 18.7 12/09/2020   Lab Results  Component Value Date   TSH 1.340 12/09/2020   Lab Results  Component Value Date   CHOL 155 12/09/2020   HDL 68 12/09/2020   LDLCALC 75 12/09/2020   TRIG 59 12/09/2020   CHOLHDL 2.3 12/09/2020   Lab Results  Component Value Date   WBC 4.5 12/09/2020   HGB 14.8 12/09/2020   HCT 46.1 12/09/2020   MCV 85 12/09/2020   PLT 228 12/09/2020   Lab Results  Component Value Date   IRON 102 12/09/2020   TIBC 362 12/09/2020   FERRITIN 235 (H) 12/09/2020   Attestation Statements:   This is the patient's first visit at [facility]. The patient's NEW PATIENT PACKET was reviewed at length. Included in the packet: current and past health history, medications, allergies, ROS, gynecologic history (women only), surgical history, family history, social history, weight history, weight loss surgery history (for those that have had weight loss surgery), nutritional evaluation,  [name] and food questionnaire, PHQ9, Epworth questionnaire, sleep habits questionnaire, patient life and health improvement goals questionnaire. These will all be scanned into the patient's chart under media.   During the visit, I independently reviewed the patient's EKG, bioimpedance scale results, and indirect calorimeter results. I used this information to tailor a meal plan for the patient that will help her to lose weight and will improve her obesity-related conditions going forward. I performed a medically necessary appropriate examination and/or evaluation. I discussed the assessment and treatment plan with the patient. The patient was provided an opportunity to ask questions and all were answered. The patient agreed with the plan and demonstrated an understanding of the instructions. Labs were ordered at this visit and will be reviewed at the next visit unless more critical results need to be addressed immediately. Clinical information was updated and documented in the EMR.   I, Water quality scientist, CMA, am acting as transcriptionist for Briscoe Deutscher, DO  I have reviewed the above documentation for accuracy and completeness, and I agree with the above. Briscoe Deutscher, DO

## 2020-12-23 ENCOUNTER — Encounter (INDEPENDENT_AMBULATORY_CARE_PROVIDER_SITE_OTHER): Payer: Self-pay | Admitting: Family Medicine

## 2020-12-23 ENCOUNTER — Ambulatory Visit (INDEPENDENT_AMBULATORY_CARE_PROVIDER_SITE_OTHER): Payer: Medicare Other | Admitting: Family Medicine

## 2020-12-23 ENCOUNTER — Other Ambulatory Visit: Payer: Self-pay

## 2020-12-23 VITALS — BP 101/66 | HR 66 | Temp 98.1°F | Ht 65.0 in | Wt 228.0 lb

## 2020-12-23 DIAGNOSIS — E7849 Other hyperlipidemia: Secondary | ICD-10-CM

## 2020-12-23 DIAGNOSIS — Z6838 Body mass index (BMI) 38.0-38.9, adult: Secondary | ICD-10-CM | POA: Diagnosis not present

## 2020-12-23 DIAGNOSIS — Z9189 Other specified personal risk factors, not elsewhere classified: Secondary | ICD-10-CM

## 2020-12-23 DIAGNOSIS — E559 Vitamin D deficiency, unspecified: Secondary | ICD-10-CM | POA: Diagnosis not present

## 2020-12-23 DIAGNOSIS — R7303 Prediabetes: Secondary | ICD-10-CM

## 2020-12-23 NOTE — Progress Notes (Signed)
Chief Complaint:   OBESITY Melissa [name] is here to discuss her progress with her obesity treatment plan along with follow-up of her obesity related diagnoses.   Today's visit was #: 2 Starting weight: 229 lbs Starting date: 12/09/2020 Today's weight: 228 lbs Today's date: 12/23/2020 Weight change since last visit: -1 lb Total lbs lost to date: 1 lb Body mass index is 37.94 kg/m.  Total weight loss percentage to date: -0.44%  Interim History:Melissa [name] reports getting back last night from her [place] trip.  Current Meal Plan: the Category 2 Plan + 300 calories for 50% of the time.  Current Exercise Plan: Walking/cardio/strenght training for 45-60 minutes 4 times per week.. Current Anti-Obesity Medications: None.  Assessment/Plan:   1. Prediabetes Not at goal. Goal is HgbA1c < 5.7.  Medication: None.  Plan:  She will continue to focus on protein-rich, low simple carbohydrate foods. We reviewed the importance of hydration, regular exercise for stress reduction, and restorative sleep.   Lab Results  Component Value Date   HGBA1C 6.5 (H) 12/09/2020   Lab Results  Component Value Date   INSULIN 18.7 12/09/2020    2. Other hyperlipidemia Course: Controlled. Lipid-lowering medications: Crestor 10 mg daily. Plan: Dietary changes: Increase soluble fiber, decrease simple carbohydrates, decrease saturated fat. Exercise changes: Moderate to vigorous-intensity aerobic activity 150 minutes per week or as tolerated. We will continue to monitor along with PCP/specialists as it pertains to her weight loss journey.  Lab Results  Component Value Date   CHOL 155 12/09/2020   HDL 68 12/09/2020   LDLCALC 75 12/09/2020   TRIG 59 12/09/2020   CHOLHDL 2.3 12/09/2020   Lab Results  Component Value Date   ALT 17 12/09/2020   AST 16 12/09/2020   ALKPHOS 120 12/09/2020   BILITOT 0.5 12/09/2020   The 10-year ASCVD risk score Mikey Bussing DC Jr., et al., 2013) is: 4.9%   Values used to calculate the  score:     Age: 70 years     Sex: Female     Is Non-Hispanic African American: Yes     Diabetic: No     Tobacco smoker: No     Systolic Blood Pressure: 790 mmHg     Is BP treated: No     HDL Cholesterol: 68 mg/dL     Total Cholesterol: 155 mg/dL  3. Vitamin D deficiency Not at goal. Current vitamin D is 47.3, tested on 12/09/2020. Optimal goal > 50 ng/dL. Plan: Continue current OTC vitamin D supplementation.  Follow-up for routine testing of Vitamin D, at least 2-3 times per year to avoid over-replacement.  4. At risk for heart disease Due to Melissa [name]'s current state of health and medical condition(s), she is at a higher risk for heart disease.  This puts the patient at much greater risk to subsequently develop cardiopulmonary conditions that can significantly affect patient's quality of life in a negative manner.    At least 8 minutes were spent on counseling Melissa [name] about these concerns today. Evidence-based interventions for health behavior change were utilized today including the discussion of self monitoring techniques, problem-solving barriers, and SMART goal setting techniques.  Specifically, regarding patient's less desirable eating habits and patterns, we employed the technique of small changes when Melissa [name] has not been able to fully commit to her prudent nutritional plan.  5. Obesity, current BMI 38  Course: Melissa [name] is currently in the action stage of change. As such, her goal is to continue with weight loss efforts.   Nutrition  goals: She has agreed to the Category 2 Plan + 300 calories.   Exercise goals: For substantial health benefits, adults should do at least 150 minutes (2 hours and 30 minutes) a week of moderate-intensity, or 75 minutes (1 hour and 15 minutes) a week of vigorous-intensity aerobic physical activity, or an equivalent combination of moderate- and vigorous-intensity aerobic activity. Aerobic activity should be performed in episodes of at least 10 minutes, and  preferably, it should be spread throughout the week.  Behavioral modification strategies: increasing lean protein intake, decreasing simple carbohydrates, increasing vegetables and increasing water intake.  Melissa [name] has agreed to follow-up with our clinic in 2 weeks. She was informed of the importance of frequent follow-up visits to maximize her success with intensive lifestyle modifications for her multiple health conditions.   Objective:   Blood pressure 101/66, pulse 66, temperature 98.1 F (36.7 C), temperature source Oral, height 5\' 5"  (1.651 m), weight 228 lb (103.4 kg), SpO2 98 %. Body mass index is 37.94 kg/m.  General: Cooperative, alert, well developed, in no acute distress. HEENT: Conjunctivae and lids unremarkable. Cardiovascular: Regular rhythm.  Lungs: Normal work of breathing. Neurologic: No focal deficits.   Lab Results  Component Value Date   CREATININE 0.96 12/09/2020   BUN 14 12/09/2020   NA 141 12/09/2020   K 4.7 12/09/2020   CL 101 12/09/2020   CO2 23 12/09/2020   Lab Results  Component Value Date   ALT 17 12/09/2020   AST 16 12/09/2020   ALKPHOS 120 12/09/2020   BILITOT 0.5 12/09/2020   Lab Results  Component Value Date   HGBA1C 6.5 (H) 12/09/2020   Lab Results  Component Value Date   INSULIN 18.7 12/09/2020   Lab Results  Component Value Date   TSH 1.340 12/09/2020   Lab Results  Component Value Date   CHOL 155 12/09/2020   HDL 68 12/09/2020   LDLCALC 75 12/09/2020   TRIG 59 12/09/2020   CHOLHDL 2.3 12/09/2020   Lab Results  Component Value Date   WBC 4.5 12/09/2020   HGB 14.8 12/09/2020   HCT 46.1 12/09/2020   MCV 85 12/09/2020   PLT 228 12/09/2020   Lab Results  Component Value Date   IRON 102 12/09/2020   TIBC 362 12/09/2020   FERRITIN 235 (H) 12/09/2020    Obesity Behavioral Intervention:   Approximately 15 minutes were spent on the discussion below.  ASK: We discussed the diagnosis of obesity with Melissa [name] today and  Melissa [name] agreed to give [place] permission to discuss obesity behavioral modification therapy today.  ASSESS: Melissa [name] has the diagnosis of obesity and her BMI today is 38. Melissa [name] is in the action stage of change.   ADVISE: Melissa [name] was educated on the multiple health risks of obesity as well as the benefit of weight loss to improve her health. She was advised of the need for long term treatment and the importance of lifestyle modifications to improve her current health and to decrease her risk of future health problems.  AGREE: Multiple dietary modification options and treatment options were discussed and Melissa [name] agreed to follow the recommendations documented in the above note.  ARRANGE: Melissa [name] was educated on the importance of frequent visits to treat obesity as outlined per CMS and USPSTF guidelines and agreed to schedule her next follow up appointment today.  Attestation Statements:   Reviewed by clinician on day of visit: allergies, medications, problem list, medical history, surgical history, family history, social history, and previous encounter notes.  I,  Inez Catalina Friedenbach, CMA, am acting as Location manager for [employer], DO.  I have reviewed the above documentation for accuracy and completeness, and I agree with the above. Briscoe Deutscher, DO

## 2021-01-05 ENCOUNTER — Other Ambulatory Visit: Payer: Self-pay | Admitting: Internal Medicine

## 2021-01-05 DIAGNOSIS — Z1231 Encounter for screening mammogram for malignant neoplasm of breast: Secondary | ICD-10-CM

## 2021-01-15 ENCOUNTER — Other Ambulatory Visit (HOSPITAL_COMMUNITY): Payer: Self-pay

## 2021-01-15 DIAGNOSIS — Z0000 Encounter for general adult medical examination without abnormal findings: Secondary | ICD-10-CM | POA: Diagnosis not present

## 2021-01-15 DIAGNOSIS — R3129 Other microscopic hematuria: Secondary | ICD-10-CM | POA: Diagnosis not present

## 2021-01-15 DIAGNOSIS — E669 Obesity, unspecified: Secondary | ICD-10-CM | POA: Diagnosis not present

## 2021-01-15 DIAGNOSIS — D649 Anemia, unspecified: Secondary | ICD-10-CM | POA: Diagnosis not present

## 2021-01-15 DIAGNOSIS — Z8249 Family history of ischemic heart disease and other diseases of the circulatory system: Secondary | ICD-10-CM | POA: Diagnosis not present

## 2021-01-15 DIAGNOSIS — N390 Urinary tract infection, site not specified: Secondary | ICD-10-CM | POA: Diagnosis not present

## 2021-01-15 DIAGNOSIS — Z853 Personal history of malignant neoplasm of breast: Secondary | ICD-10-CM | POA: Diagnosis not present

## 2021-01-15 DIAGNOSIS — Z79899 Other long term (current) drug therapy: Secondary | ICD-10-CM | POA: Diagnosis not present

## 2021-01-15 DIAGNOSIS — M179 Osteoarthritis of knee, unspecified: Secondary | ICD-10-CM | POA: Diagnosis not present

## 2021-01-15 DIAGNOSIS — E559 Vitamin D deficiency, unspecified: Secondary | ICD-10-CM | POA: Diagnosis not present

## 2021-01-15 MED ORDER — ESTRADIOL 0.1 MG/GM VA CREA
TOPICAL_CREAM | VAGINAL | 1 refills | Status: DC
  Filled 2021-01-15: qty 42.5, 90d supply, fill #0

## 2021-01-19 ENCOUNTER — Encounter (INDEPENDENT_AMBULATORY_CARE_PROVIDER_SITE_OTHER): Payer: Self-pay | Admitting: Family Medicine

## 2021-01-19 ENCOUNTER — Ambulatory Visit (INDEPENDENT_AMBULATORY_CARE_PROVIDER_SITE_OTHER): Payer: 59 | Admitting: Family Medicine

## 2021-01-19 ENCOUNTER — Other Ambulatory Visit: Payer: Self-pay

## 2021-01-19 VITALS — BP 103/68 | HR 68 | Temp 98.1°F | Ht 65.0 in | Wt 226.0 lb

## 2021-01-19 DIAGNOSIS — R7303 Prediabetes: Secondary | ICD-10-CM

## 2021-01-19 DIAGNOSIS — E559 Vitamin D deficiency, unspecified: Secondary | ICD-10-CM

## 2021-01-19 DIAGNOSIS — Z6838 Body mass index (BMI) 38.0-38.9, adult: Secondary | ICD-10-CM

## 2021-01-19 DIAGNOSIS — E7849 Other hyperlipidemia: Secondary | ICD-10-CM | POA: Diagnosis not present

## 2021-01-19 DIAGNOSIS — Z9189 Other specified personal risk factors, not elsewhere classified: Secondary | ICD-10-CM | POA: Diagnosis not present

## 2021-01-20 ENCOUNTER — Other Ambulatory Visit (HOSPITAL_COMMUNITY): Payer: Self-pay

## 2021-01-27 ENCOUNTER — Other Ambulatory Visit (HOSPITAL_COMMUNITY): Payer: Self-pay

## 2021-01-27 NOTE — Progress Notes (Signed)
Chief Complaint:   OBESITY Melissa [name] is here to discuss her progress with her obesity treatment plan along with follow-up of her obesity related diagnoses.   Today's visit was #: 3 Starting weight: 229 lbs Starting date: 12/09/2020 Today's weight: 226 lbs Today's date: 01/19/2021 Weight change since last visit: 2 lbs Total lbs lost to date: 3 lbs Body mass index is 37.61 kg/m.  Total weight loss percentage to date: -1.31%  Interim History: Melissa [name] recently took a trip to [place].  She says she enjoyed her favorite restaurants.  She says she still has a sweet tooth, especially at night. Current Meal Plan: the Category 2 Plan +300 for 50-60% of the time.  Current Exercise Plan: Cardio/strength training for 60 minutes 4 times per week.  Assessment/Plan:   1. Prediabetes Not at goal. Goal is HgbA1c < 5.7.  Medication: None.    Plan:  She will continue to focus on protein-rich, low simple carbohydrate foods. We reviewed the importance of hydration, regular exercise for stress reduction, and restorative sleep.   Lab Results  Component Value Date   HGBA1C 6.5 (H) 12/09/2020   Lab Results  Component Value Date   INSULIN 18.7 12/09/2020   2. Vitamin D deficiency Not optimized.  She is taking 1,000 IU daily.  Plan: Continue current OTC vitamin D supplementation.  Follow-up for routine testing of Vitamin D, at least 2-3 times per year to avoid over-replacement.  Lab Results  Component Value Date   VD25OH 47.3 12/09/2020   3. Other hyperlipidemia Course: At goal. Lipid-lowering medications: Crestor 10 mg daily.   Plan: Dietary changes: Increase soluble fiber, decrease simple carbohydrates, decrease saturated fat. Exercise changes: Moderate to vigorous-intensity aerobic activity 150 minutes per week or as tolerated. We will continue to monitor along with PCP/specialists as it pertains to her weight loss journey.  Lab Results  Component Value Date   CHOL 155 12/09/2020   HDL 68  12/09/2020   LDLCALC 75 12/09/2020   TRIG 59 12/09/2020   CHOLHDL 2.3 12/09/2020   Lab Results  Component Value Date   ALT 17 12/09/2020   AST 16 12/09/2020   ALKPHOS 120 12/09/2020   BILITOT 0.5 12/09/2020   The 10-year ASCVD risk score Melissa [name] DC Jr., et al., 2013) is: 5.1%   Values used to calculate the score:     Age: 70 years     Sex: Female     Is Non-Hispanic African American: Yes     Diabetic: No     Tobacco smoker: No     Systolic Blood Pressure: 329 mmHg     Is BP treated: No     HDL Cholesterol: 68 mg/dL     Total Cholesterol: 155 mg/dL  4. At risk for diabetes mellitus Melissa [name] was given diabetes prevention education and counseling today of more than 8 minutes. During insulin resistance, several metabolic alterations induce the development of cardiovascular disease. For instance, insulin resistance can induce an imbalance in glucose metabolism that generates chronic hyperglycemia, which in turn triggers oxidative stress and causes an inflammatory response that leads to cell damage. Insulin resistance can also alter systemic lipid metabolism which then leads to the development of dyslipidemia and the well-known lipid triad: (1) high levels of plasma triglycerides, (2) low levels of high-density lipoprotein, and (3) the appearance of small dense low-density lipoproteins. This triad, along with endothelial dysfunction, which can also be induced by aberrant insulin signaling, contribute to atherosclerotic plaque formation.   5. Obesity, current  BMI 37.6  Course: Melissa [name] is currently in the action stage of change. As such, her goal is to continue with weight loss efforts.   Nutrition goals: She has agreed to keeping a food journal and adhering to recommended goals of 1200-1500 calories and 95 grams of protein.   Exercise goals: For substantial health benefits, adults should do at least 150 minutes (2 hours and 30 minutes) a week of moderate-intensity, or 75 minutes (1 hour and 15  minutes) a week of vigorous-intensity aerobic physical activity, or an equivalent combination of moderate- and vigorous-intensity aerobic activity. Aerobic activity should be performed in episodes of at least 10 minutes, and preferably, it should be spread throughout the week.  Behavioral modification strategies: increasing lean protein intake, decreasing simple carbohydrates, increasing vegetables, increasing water intake, decreasing sodium intake, and increasing high fiber foods.  [name] has agreed to follow-up with our clinic in 3 weeks. She was informed of the importance of frequent follow-up visits to maximize her success with intensive lifestyle modifications for her multiple health conditions.   Objective:   Blood pressure 103/68, pulse 68, temperature 98.1 F (36.7 C), temperature source Oral, height 5\' 5"  (1.651 m), weight 226 lb (102.5 kg), SpO2 96 %. Body mass index is 37.61 kg/m.  General: Cooperative, alert, well developed, in no acute distress. HEENT: Conjunctivae and lids unremarkable. Cardiovascular: Regular rhythm.  Lungs: Normal work of breathing. Neurologic: No focal deficits.   Lab Results  Component Value Date   CREATININE 0.96 12/09/2020   BUN 14 12/09/2020   NA 141 12/09/2020   K 4.7 12/09/2020   CL 101 12/09/2020   CO2 23 12/09/2020   Lab Results  Component Value Date   ALT 17 12/09/2020   AST 16 12/09/2020   ALKPHOS 120 12/09/2020   BILITOT 0.5 12/09/2020   Lab Results  Component Value Date   HGBA1C 6.5 (H) 12/09/2020   Lab Results  Component Value Date   INSULIN 18.7 12/09/2020   Lab Results  Component Value Date   TSH 1.340 12/09/2020   Lab Results  Component Value Date   CHOL 155 12/09/2020   HDL 68 12/09/2020   LDLCALC 75 12/09/2020   TRIG 59 12/09/2020   CHOLHDL 2.3 12/09/2020   Lab Results  Component Value Date   VD25OH 47.3 12/09/2020   Lab Results  Component Value Date   WBC 4.5 12/09/2020   HGB 14.8 12/09/2020   HCT 46.1  12/09/2020   MCV 85 12/09/2020   PLT 228 12/09/2020   Lab Results  Component Value Date   IRON 102 12/09/2020   TIBC 362 12/09/2020   FERRITIN 235 (H) 12/09/2020   Attestation Statements:   Reviewed by clinician on day of visit: allergies, medications, problem list, medical history, surgical history, family history, social history, and previous encounter notes.  I, Water quality scientist, CMA, am acting as transcriptionist for Briscoe Deutscher, DO  I have reviewed the above documentation for accuracy and completeness, and I agree with the above. Briscoe Deutscher, DO

## 2021-01-28 ENCOUNTER — Other Ambulatory Visit (HOSPITAL_COMMUNITY): Payer: Self-pay

## 2021-02-02 ENCOUNTER — Other Ambulatory Visit (HOSPITAL_COMMUNITY): Payer: Self-pay

## 2021-02-02 MED ORDER — CEPHALEXIN 250 MG PO CAPS
ORAL_CAPSULE | ORAL | 0 refills | Status: DC
  Filled 2021-02-02: qty 40, 13d supply, fill #0

## 2021-02-03 ENCOUNTER — Other Ambulatory Visit (HOSPITAL_COMMUNITY): Payer: Self-pay

## 2021-02-03 MED ORDER — ROSUVASTATIN CALCIUM 10 MG PO TABS
ORAL_TABLET | ORAL | 1 refills | Status: DC
  Filled 2021-02-03: qty 90, 90d supply, fill #0
  Filled 2021-06-25: qty 90, 90d supply, fill #1

## 2021-02-09 ENCOUNTER — Ambulatory Visit (INDEPENDENT_AMBULATORY_CARE_PROVIDER_SITE_OTHER): Payer: 59 | Admitting: Bariatrics

## 2021-02-09 ENCOUNTER — Other Ambulatory Visit: Payer: Self-pay

## 2021-02-09 ENCOUNTER — Encounter (INDEPENDENT_AMBULATORY_CARE_PROVIDER_SITE_OTHER): Payer: Self-pay | Admitting: Bariatrics

## 2021-02-09 VITALS — BP 114/77 | HR 57 | Temp 98.1°F | Ht 65.0 in | Wt 227.0 lb

## 2021-02-09 DIAGNOSIS — E559 Vitamin D deficiency, unspecified: Secondary | ICD-10-CM | POA: Diagnosis not present

## 2021-02-09 DIAGNOSIS — E7849 Other hyperlipidemia: Secondary | ICD-10-CM | POA: Diagnosis not present

## 2021-02-09 DIAGNOSIS — Z6838 Body mass index (BMI) 38.0-38.9, adult: Secondary | ICD-10-CM

## 2021-02-11 NOTE — Progress Notes (Signed)
Chief Complaint:   OBESITY Melissa [name] is here to discuss her progress with her obesity treatment plan along with follow-up of her obesity related diagnoses. Melissa [name] is on the Category 2 Plan and states she is following her eating plan approximately 40% of the time. Melissa [name] states she is doing cardio and weight training 60-75 minutes 3 times per week.  Today's visit was #: 4 Starting weight: 229 lbs Starting date: 12/09/2020 Today's weight: 227 lbs Today's date: 02/09/2021 Total lbs lost to date: 2 lbs Total lbs lost since last in-office visit: 0  Interim History: Melissa [name] is up 1 lb. She has been keeping her grandchildren. She is doing well with her H2O.  Subjective:   1. Other hyperlipidemia Melissa [name] is taking Crestor.  2. Vitamin D deficiency Melissa [name] is taking Vitamin D.  Assessment/Plan:   1. Other hyperlipidemia Melissa [name] will continue Crestor. She will continue meals with no trans fats and saturated fats. Cardiovascular risk and specific lipid/LDL goals reviewed.  We discussed several lifestyle modifications today and Melissa [name] will continue to work on diet, exercise and weight loss efforts. Orders and follow up as documented in patient record.   Counseling Intensive lifestyle modifications are the first line treatment for this issue. Dietary changes: Increase soluble fiber. Decrease simple carbohydrates. Exercise changes: Moderate to vigorous-intensity aerobic activity 150 minutes per week if tolerated. Lipid-lowering medications: see documented in medical record.   2. Vitamin D deficiency Low Vitamin D level contributes to fatigue and are associated with obesity, breast, and colon cancer. Melissa [name] will continue taking Vitamin D OTC 50,000 IU every week and will follow-up for routine testing of Vitamin D, at least 2-3 times per year to avoid over-replacement.   3. Obesity, current BMI 37.8 Melissa [name] is currently in the action stage of change. As such, her goal is to continue with weight  loss efforts. She has agreed to the Category 2 Plan.   Melissa [name] will continue to meal plan. She will continue intentional eating and increase H2O. I reviewed th fruit sheet with Melissa [name]. She will get back on track.  Exercise goals:  As is, decrease frequency.  Behavioral modification strategies: increasing lean protein intake, decreasing simple carbohydrates, increasing vegetables, increasing water intake, decreasing eating out, no skipping meals, meal planning and cooking strategies, keeping healthy foods in the home, and planning for success.  Melissa [name] has agreed to follow-up with our clinic in 2-3 weeks with Dr. Juleen China. She was informed of the importance of frequent follow-up visits to maximize her success with intensive lifestyle modifications for her multiple health conditions.   Objective:   Blood pressure 114/77, pulse (!) 57, temperature 98.1 F (36.7 C), height 5\' 5"  (1.651 m), weight 227 lb (103 kg), SpO2 98 %. Body mass index is 37.77 kg/m.  General: Cooperative, alert, well developed, in no acute distress. HEENT: Conjunctivae and lids unremarkable. Cardiovascular: Regular rhythm.  Lungs: Normal work of breathing. Neurologic: No focal deficits.   Lab Results  Component Value Date   CREATININE 0.96 12/09/2020   BUN 14 12/09/2020   NA 141 12/09/2020   K 4.7 12/09/2020   CL 101 12/09/2020   CO2 23 12/09/2020   Lab Results  Component Value Date   ALT 17 12/09/2020   AST 16 12/09/2020   ALKPHOS 120 12/09/2020   BILITOT 0.5 12/09/2020   Lab Results  Component Value Date   HGBA1C 6.5 (H) 12/09/2020   Lab Results  Component Value Date   INSULIN 18.7 12/09/2020   Lab Results  Component Value Date   TSH 1.340 12/09/2020   Lab Results  Component Value Date   CHOL 155 12/09/2020   HDL 68 12/09/2020   LDLCALC 75 12/09/2020   TRIG 59 12/09/2020   CHOLHDL 2.3 12/09/2020   Lab Results  Component Value Date   VD25OH 47.3 12/09/2020   Lab Results  Component Value  Date   WBC 4.5 12/09/2020   HGB 14.8 12/09/2020   HCT 46.1 12/09/2020   MCV 85 12/09/2020   PLT 228 12/09/2020   Lab Results  Component Value Date   IRON 102 12/09/2020   TIBC 362 12/09/2020   FERRITIN 235 (H) 12/09/2020   Attestation Statements:   Reviewed by clinician on day of visit: allergies, medications, problem list, medical history, surgical history, family history, social history, and previous encounter notes.  Time spent on visit including pre-visit chart review and post-visit care and charting was 20 minutes.   I, Lizbeth Bark, RMA, am acting as Location manager for [employer], DO.   I have reviewed the above documentation for accuracy and completeness, and I agree with the above. Jearld Lesch, DO

## 2021-02-12 ENCOUNTER — Encounter (INDEPENDENT_AMBULATORY_CARE_PROVIDER_SITE_OTHER): Payer: Self-pay | Admitting: Bariatrics

## 2021-03-03 ENCOUNTER — Ambulatory Visit
Admission: RE | Admit: 2021-03-03 | Discharge: 2021-03-03 | Disposition: A | Discharge status: Home / Self Care | Payer: 59 | Source: Ambulatory Visit | Attending: Internal Medicine | Admitting: Internal Medicine

## 2021-03-03 ENCOUNTER — Other Ambulatory Visit: Payer: Self-pay

## 2021-03-03 DIAGNOSIS — Z1231 Encounter for screening mammogram for malignant neoplasm of breast: Secondary | ICD-10-CM | POA: Diagnosis not present

## 2021-03-09 ENCOUNTER — Encounter (INDEPENDENT_AMBULATORY_CARE_PROVIDER_SITE_OTHER): Payer: Self-pay

## 2021-03-10 ENCOUNTER — Ambulatory Visit (INDEPENDENT_AMBULATORY_CARE_PROVIDER_SITE_OTHER): Payer: Medicare Other | Admitting: Family Medicine

## 2021-03-11 ENCOUNTER — Ambulatory Visit (INDEPENDENT_AMBULATORY_CARE_PROVIDER_SITE_OTHER): Payer: 59 | Admitting: Family Medicine

## 2021-03-11 ENCOUNTER — Ambulatory Visit (INDEPENDENT_AMBULATORY_CARE_PROVIDER_SITE_OTHER): Payer: Medicare Other | Admitting: Family Medicine

## 2021-03-11 ENCOUNTER — Other Ambulatory Visit: Payer: Self-pay

## 2021-03-11 ENCOUNTER — Encounter (INDEPENDENT_AMBULATORY_CARE_PROVIDER_SITE_OTHER): Payer: Self-pay | Admitting: Family Medicine

## 2021-03-11 VITALS — BP 100/70 | HR 74 | Temp 98.5°F | Ht 65.0 in | Wt 224.0 lb

## 2021-03-11 DIAGNOSIS — Z9189 Other specified personal risk factors, not elsewhere classified: Secondary | ICD-10-CM | POA: Diagnosis not present

## 2021-03-11 DIAGNOSIS — R7303 Prediabetes: Secondary | ICD-10-CM

## 2021-03-11 DIAGNOSIS — E65 Localized adiposity: Secondary | ICD-10-CM | POA: Diagnosis not present

## 2021-03-11 DIAGNOSIS — E669 Obesity, unspecified: Secondary | ICD-10-CM | POA: Diagnosis not present

## 2021-03-11 DIAGNOSIS — F3289 Other specified depressive episodes: Secondary | ICD-10-CM | POA: Diagnosis not present

## 2021-03-16 NOTE — Progress Notes (Signed)
Chief Complaint:   OBESITY Melissa [name] is here to discuss her progress with her obesity treatment plan along with follow-up of her obesity related diagnoses.   Today's visit was #: 5 Starting weight: 229 lbs Starting date: 12/09/2020 Today's weight: 224 lbs Today's date: 03/11/2021 Weight change since last visit: 3 lbs Total lbs lost to date: 5 lbs Body mass index is 37.28 kg/m.  Total weight loss percentage to date: -2.18%  Current Meal Plan: the Category 2 Plan for 50% of the time.  Current Exercise Plan: Cardio/bike for 60 minutes 4 times per week.  Interim History:  Melissa [name]'s plan is for 100% plan adherence by September 12th.  She will be going to the [place] soon.  She asks about protein supplements.  We discussed collagen.  Assessment/Plan:   1. Prediabetes Not at goal. Goal is HgbA1c < 5.7.  Medication: None.    Plan:  She will continue to focus on protein-rich, low simple carbohydrate foods. We reviewed the importance of hydration, regular exercise for stress reduction, and restorative sleep.   Lab Results  Component Value Date   HGBA1C 6.5 (H) 12/09/2020   Lab Results  Component Value Date   INSULIN 18.7 12/09/2020   2. Visceral obesity Current visceral fat rating: 15. Visceral fat rating should be < 13. Visceral adipose tissue is a hormonally active component of total body fat. This body composition phenotype is associated with medical disorders such as metabolic syndrome, cardiovascular disease and several malignancies including prostate, breast, and colorectal cancers. Starting goal: Lose 7-10% of starting weight.   3. Emotional eating tendencies Controlled. Medication: None.  Plan:  Discussed cues and consequences, how thoughts affect eating, model of thoughts, feelings, and behaviors, and strategies for change by focusing on the cue. Discussed cognitive distortions, coping thoughts, and how to change your thoughts.  4. At risk for heart disease Due to  Melissa [name]'s current state of health and medical condition(s), she is at a higher risk for heart disease.  This puts the patient at much greater risk to subsequently develop cardiopulmonary conditions that can significantly affect patient's quality of life in a negative manner.    At least 8 minutes were spent on counseling Melissa [name] about these concerns today. Evidence-based interventions for health behavior change were utilized today including the discussion of self monitoring techniques, problem-solving barriers, and SMART goal setting techniques.  Specifically, regarding patient's less desirable eating habits and patterns, we employed the technique of small changes when Eri has not been able to fully commit to her prudent nutritional plan.  5. Obesity with current BMI of 37.3  Course: Melissa [name] is currently in the action stage of change. As such, her goal is to continue with weight loss efforts.   Nutrition goals: She has agreed to the Category 2 Plan.   Exercise goals:  As is.  Behavioral modification strategies: increasing lean protein intake, decreasing simple carbohydrates, increasing vegetables, and increasing water intake.  Melissa [name] has agreed to follow-up with our clinic in 4 weeks. She was informed of the importance of frequent follow-up visits to maximize her success with intensive lifestyle modifications for her multiple health conditions.   Objective:   Blood pressure 100/70, pulse 74, temperature 98.5 F (36.9 C), height '5\' 5"'$  (1.651 m), weight 224 lb (101.6 kg), SpO2 96 %. Body mass index is 37.28 kg/m.  General: Cooperative, alert, well developed, in no acute distress. HEENT: Conjunctivae and lids unremarkable. Cardiovascular: Regular rhythm.  Lungs: Normal work of breathing. Neurologic: No focal  deficits.   Lab Results  Component Value Date   CREATININE 0.96 12/09/2020   BUN 14 12/09/2020   NA 141 12/09/2020   K 4.7 12/09/2020   CL 101 12/09/2020   CO2 23 12/09/2020    Lab Results  Component Value Date   ALT 17 12/09/2020   AST 16 12/09/2020   ALKPHOS 120 12/09/2020   BILITOT 0.5 12/09/2020   Lab Results  Component Value Date   HGBA1C 6.5 (H) 12/09/2020   Lab Results  Component Value Date   INSULIN 18.7 12/09/2020   Lab Results  Component Value Date   TSH 1.340 12/09/2020   Lab Results  Component Value Date   CHOL 155 12/09/2020   HDL 68 12/09/2020   LDLCALC 75 12/09/2020   TRIG 59 12/09/2020   CHOLHDL 2.3 12/09/2020   Lab Results  Component Value Date   VD25OH 47.3 12/09/2020   Lab Results  Component Value Date   WBC 4.5 12/09/2020   HGB 14.8 12/09/2020   HCT 46.1 12/09/2020   MCV 85 12/09/2020   PLT 228 12/09/2020   Lab Results  Component Value Date   IRON 102 12/09/2020   TIBC 362 12/09/2020   FERRITIN 235 (H) 12/09/2020   Attestation Statements:   Reviewed by clinician on day of visit: allergies, medications, problem list, medical history, surgical history, family history, social history, and previous encounter notes.  I, Water quality scientist, CMA, am acting as transcriptionist for Briscoe Deutscher, DO  I have reviewed the above documentation for accuracy and completeness, and I agree with the above. Briscoe Deutscher, DO

## 2021-03-19 DIAGNOSIS — M25561 Pain in right knee: Secondary | ICD-10-CM | POA: Diagnosis not present

## 2021-03-19 DIAGNOSIS — Z96652 Presence of left artificial knee joint: Secondary | ICD-10-CM | POA: Diagnosis not present

## 2021-03-23 ENCOUNTER — Other Ambulatory Visit: Payer: Self-pay | Admitting: Oncology

## 2021-03-23 ENCOUNTER — Other Ambulatory Visit (HOSPITAL_COMMUNITY): Payer: Self-pay

## 2021-03-23 MED ORDER — LETROZOLE 2.5 MG PO TABS
ORAL_TABLET | Freq: Every day | ORAL | 1 refills | Status: DC
  Filled 2021-03-23: qty 90, 90d supply, fill #0
  Filled 2021-07-08: qty 90, 90d supply, fill #1

## 2021-04-09 ENCOUNTER — Other Ambulatory Visit: Payer: Self-pay

## 2021-04-09 ENCOUNTER — Inpatient Hospital Stay: Payer: 59 | Attending: Oncology | Admitting: Oncology

## 2021-04-09 VITALS — BP 117/86 | HR 72 | Temp 98.1°F | Resp 18 | Ht 65.0 in | Wt 224.2 lb

## 2021-04-09 DIAGNOSIS — C50812 Malignant neoplasm of overlapping sites of left female breast: Secondary | ICD-10-CM | POA: Insufficient documentation

## 2021-04-09 DIAGNOSIS — C50912 Malignant neoplasm of unspecified site of left female breast: Secondary | ICD-10-CM | POA: Diagnosis not present

## 2021-04-09 DIAGNOSIS — Z170 Estrogen receptor positive status [ER+]: Secondary | ICD-10-CM | POA: Insufficient documentation

## 2021-04-09 DIAGNOSIS — Z923 Personal history of irradiation: Secondary | ICD-10-CM | POA: Insufficient documentation

## 2021-04-09 NOTE — Progress Notes (Signed)
Melissa [name] OFFICE PROGRESS NOTE   Diagnosis: Breast cancer  INTERVAL HISTORY:   [name] returns as scheduled.  She continues letrozole.  She feels well.  No palpable change over either breast.  She has numbness in the left arm when she sleeps on the left side.  This resolves with movement.  She is being followed in a weight loss clinic.  She plans to schedule a knee replacement in December. A bilateral mammogram on 03/03/2021 was negative. Objective:  Vital signs in last 24 hours:  Blood pressure 117/86, pulse 72, temperature 98.1 F (36.7 C), temperature source Oral, resp. rate 18, height '5\' 5"'  (1.651 m), weight 224 lb 3.2 oz (101.7 kg), SpO2 100 %.    Lymphatics: No cervical, supraclavicular, or axillary nodes Resp: Lungs clear bilaterally Cardio: Regular rate and rhythm GI: No hepatomegaly Vascular: No leg edema Breast: No mass in either breast.  The left axilla appears benign.  No palpable nodule in the left axilla   Lab Results:  Lab Results  Component Value Date   WBC 4.5 12/09/2020   HGB 14.8 12/09/2020   HCT 46.1 12/09/2020   MCV 85 12/09/2020   PLT 228 12/09/2020   NEUTROABS 2.2 12/09/2020    CMP  Lab Results  Component Value Date   NA 141 12/09/2020   K 4.7 12/09/2020   CL 101 12/09/2020   CO2 23 12/09/2020   GLUCOSE 100 (H) 12/09/2020   BUN 14 12/09/2020   CREATININE 0.96 12/09/2020   CALCIUM 9.7 12/09/2020   PROT 7.0 12/09/2020   ALBUMIN 4.5 12/09/2020   AST 16 12/09/2020   ALT 17 12/09/2020   ALKPHOS 120 12/09/2020   BILITOT 0.5 12/09/2020   GFRNONAA 52 (L) 04/03/2019   GFRAA 59 (L) 04/03/2019     Medications: I have reviewed the patient's current medications.   Assessment/Plan: 1.Stage I (T1 N0) left-sided breast cancer diagnosed in January of 1999, ER positive, PR positive, HER-2 negative, status post a left lumpectomy, left axillary lymph node dissection, and left breast radiation. She completed 5 years of adjuvant  tamoxifen therapy in January of 2004.   2. Recurrent invasive breast cancer near the left lumpectomy scar-confirmed on a needle core biopsy 01/05/2012, the pathology is consistent with invasive breast cancer, ER positive, PR positive, HER-2 negative. ? Local recurrence versus a new breast primary   -breast MRI 01/17/2012 confirmed an isolated mass in the upper outer left breast   -bone scan on 01/19/2012-negative aside from an area of very subtle uptake in the inferior sternum without a CT correlate   -staging CTs of the chest, abdomen, and pelvis on 01/19/2012-negative for metastatic disease   -Partial mastectomy 02/14/2012 confirmed a 1.5 cm grade 2 invasive carcinoma with associated DCIS and negative surgical margins   -Oncotype recurrence score-24   -Initiation of [facility] after an office visit on 03/09/2012   3. Tiny cutaneous nodular lesion overlying the left clavicle when she was here on 03/09/2012  4. Knee Arthralgias-most likely related to degenerative arthritis-status post left total knee arthroplasty 07/10/2018 5. Pea-sized  nodular lesion near the left axillary scar-not palpated today       Disposition: [name] remains in clinical remission from breast cancer.  She will continue letrozole.  She is taking vitamin D.  She will asked Dr. Inda Merlin to schedule a bone density scan for the summer 2023.  She continues yearly mammography.  She will return for an office visit in 1 year.  She will contact  us in the interim for new symptoms.  Betsy Coder, MD  04/09/2021  10:34 AM

## 2021-04-15 ENCOUNTER — Other Ambulatory Visit: Payer: Self-pay

## 2021-04-15 ENCOUNTER — Ambulatory Visit (INDEPENDENT_AMBULATORY_CARE_PROVIDER_SITE_OTHER): Payer: 59 | Admitting: Family Medicine

## 2021-04-15 ENCOUNTER — Encounter (INDEPENDENT_AMBULATORY_CARE_PROVIDER_SITE_OTHER): Payer: Self-pay | Admitting: Family Medicine

## 2021-04-15 VITALS — BP 104/67 | HR 61 | Temp 98.0°F | Ht 65.0 in | Wt 222.0 lb

## 2021-04-15 DIAGNOSIS — E65 Localized adiposity: Secondary | ICD-10-CM

## 2021-04-15 DIAGNOSIS — Z6838 Body mass index (BMI) 38.0-38.9, adult: Secondary | ICD-10-CM

## 2021-04-15 DIAGNOSIS — F3289 Other specified depressive episodes: Secondary | ICD-10-CM | POA: Diagnosis not present

## 2021-04-15 DIAGNOSIS — R7303 Prediabetes: Secondary | ICD-10-CM | POA: Diagnosis not present

## 2021-04-15 DIAGNOSIS — Z9189 Other specified personal risk factors, not elsewhere classified: Secondary | ICD-10-CM | POA: Diagnosis not present

## 2021-04-16 NOTE — Progress Notes (Signed)
Chief Complaint:   OBESITY Melissa [name] is here to discuss her progress with her obesity treatment plan along with follow-up of her obesity related diagnoses.   Today's visit was #: 6 Starting weight: 229 lbs Starting date: 12/09/2020 Today's weight: 222 lbs Today's date: 04/15/2021 Weight change since last visit: 2 lbs Total lbs lost to date: 7 lbs Body mass index is 36.94 kg/m.  Total weight loss percentage to date: -3.06%  Current Meal Plan: the Category 2 Plan for 50-60% of the time.  Current Exercise Plan: Cardio/strength training for 60-70 minutes 4 times per week.  Interim History:  Kalana is open to medication if approved by her oncologist Darcel Bayley or Ozempic).  Assessment/Plan:   1. Prediabetes Not at goal. Goal is HgbA1c < 5.7.  Medication: None.    Plan:  She will discuss medication with her oncologist.  She will continue to focus on protein-rich, low simple carbohydrate foods. We reviewed the importance of hydration, regular exercise for stress reduction, and restorative sleep.   Lab Results  Component Value Date   HGBA1C 6.5 (H) 12/09/2020   Lab Results  Component Value Date   INSULIN 18.7 12/09/2020   2. Visceral obesity Current visceral fat rating: 15. Visceral fat rating should be < 13. Visceral adipose tissue is a hormonally active component of total body fat. This body composition phenotype is associated with medical disorders such as metabolic syndrome, cardiovascular disease and several malignancies including prostate, breast, and colorectal cancers. Starting goal: Lose 7-10% of starting weight.   3. Emotional eating tendencies Not at goal. Medication: None.  Plan:  Discussed cues and consequences, how thoughts affect eating, model of thoughts, feelings, and behaviors, and strategies for change by focusing on the cue. Discussed cognitive distortions, coping thoughts, and how to change your thoughts.  4. At risk for heart disease Due to Elveta's  current state of health and medical condition(s), she is at a higher risk for heart disease.  This puts the patient at much greater risk to subsequently develop cardiopulmonary conditions that can significantly affect patient's quality of life in a negative manner.    At least 8 minutes were spent on counseling Winni about these concerns today. Evidence-based interventions for health behavior change were utilized today including the discussion of self monitoring techniques, problem-solving barriers, and SMART goal setting techniques.  Specifically, regarding patient's less desirable eating habits and patterns, we employed the technique of small changes when Pinkie has not been able to fully commit to her prudent nutritional plan.  5. Obesity, current BMI 37  Course: Soleil is currently in the action stage of change. As such, her goal is to continue with weight loss efforts.   Nutrition goals: She has agreed to the Category 2 Plan.   Exercise goals:  As is.  Behavioral modification strategies: increasing lean protein intake, decreasing simple carbohydrates, increasing vegetables, and increasing water intake.  Lashundra has agreed to follow-up with our clinic in 4 weeks. She was informed of the importance of frequent follow-up visits to maximize her success with intensive lifestyle modifications for her multiple health conditions.   Objective:   Blood pressure 104/67, pulse 61, temperature 98 F (36.7 C), temperature source Oral, height 5\' 5"  (1.651 m), weight 222 lb (100.7 kg), SpO2 97 %. Body mass index is 36.94 kg/m.  General: Cooperative, alert, well developed, in no acute distress. HEENT: Conjunctivae and lids unremarkable. Cardiovascular: Regular rhythm.  Lungs: Normal work of breathing. Neurologic: No focal deficits.   Lab  Results  Component Value Date   CREATININE 0.96 12/09/2020   BUN 14 12/09/2020   NA 141 12/09/2020   K 4.7 12/09/2020   CL 101 12/09/2020   CO2 23 12/09/2020    Lab Results  Component Value Date   ALT 17 12/09/2020   AST 16 12/09/2020   ALKPHOS 120 12/09/2020   BILITOT 0.5 12/09/2020   Lab Results  Component Value Date   HGBA1C 6.5 (H) 12/09/2020   Lab Results  Component Value Date   INSULIN 18.7 12/09/2020   Lab Results  Component Value Date   TSH 1.340 12/09/2020   Lab Results  Component Value Date   CHOL 155 12/09/2020   HDL 68 12/09/2020   LDLCALC 75 12/09/2020   TRIG 59 12/09/2020   CHOLHDL 2.3 12/09/2020   Lab Results  Component Value Date   VD25OH 47.3 12/09/2020   Lab Results  Component Value Date   WBC 4.5 12/09/2020   HGB 14.8 12/09/2020   HCT 46.1 12/09/2020   MCV 85 12/09/2020   PLT 228 12/09/2020   Lab Results  Component Value Date   IRON 102 12/09/2020   TIBC 362 12/09/2020   FERRITIN 235 (H) 12/09/2020   Attestation Statements:   Reviewed by clinician on day of visit: allergies, medications, problem list, medical history, surgical history, family history, social history, and previous encounter notes.  I, Water quality scientist, CMA, am acting as transcriptionist for Briscoe Deutscher, DO  I have reviewed the above documentation for accuracy and completeness, and I agree with the above. Briscoe Deutscher, DO

## 2021-04-20 ENCOUNTER — Other Ambulatory Visit (HOSPITAL_COMMUNITY): Payer: Self-pay

## 2021-04-20 ENCOUNTER — Encounter (INDEPENDENT_AMBULATORY_CARE_PROVIDER_SITE_OTHER): Payer: Self-pay

## 2021-04-20 ENCOUNTER — Other Ambulatory Visit (INDEPENDENT_AMBULATORY_CARE_PROVIDER_SITE_OTHER): Payer: Self-pay

## 2021-04-20 DIAGNOSIS — R7303 Prediabetes: Secondary | ICD-10-CM

## 2021-04-20 MED ORDER — TIRZEPATIDE 2.5 MG/0.5ML ~~LOC~~ SOAJ
2.5000 mg | SUBCUTANEOUS | 0 refills | Status: DC
Start: 2021-04-20 — End: 2021-05-14
  Filled 2021-04-20: qty 2, 28d supply, fill #0

## 2021-04-21 ENCOUNTER — Other Ambulatory Visit (HOSPITAL_COMMUNITY): Payer: Self-pay

## 2021-04-23 DIAGNOSIS — Z23 Encounter for immunization: Secondary | ICD-10-CM | POA: Diagnosis not present

## 2021-04-28 ENCOUNTER — Other Ambulatory Visit (HOSPITAL_COMMUNITY): Payer: Self-pay

## 2021-04-28 MED ORDER — CEPHALEXIN 250 MG PO CAPS
ORAL_CAPSULE | ORAL | 0 refills | Status: DC
  Filled 2021-04-28: qty 36, 84d supply, fill #0
  Filled 2021-07-27: qty 36, 84d supply, fill #1

## 2021-05-12 ENCOUNTER — Telehealth (HOSPITAL_BASED_OUTPATIENT_CLINIC_OR_DEPARTMENT_OTHER): Payer: Self-pay | Admitting: Cardiovascular Disease

## 2021-05-12 NOTE — Telephone Encounter (Signed)
   [facility] Pre-operative Risk Assessment    Patient Name: MARKELL SCHRIER  DOB: 1950/11/25 MRN: [id number]   Request for surgical clearance:  What type of surgery is being performed? Right Total Knee Arthroplasty  When is this surgery scheduled? 08/24/21  What type of clearance is required (medical clearance vs. Pharmacy clearance to hold med vs. Both)? Medical  Are there any medications that need to be held prior to surgery and how long? None specified--pt not on anticoag  Practice name and name of physician performing surgery? [facility]; Dr. Pilar Plate Aluisio  What is the office phone number? [phone]   7.   What is the office fax number? [place]  8.   Anesthesia type (None, local, MAC, general) ? Choice   Francella Solian 05/12/2021, 2:08 PM  _________________________________________________________________   (provider comments below)

## 2021-05-12 NOTE — Telephone Encounter (Signed)
Dr. Oval Linsey to review.  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  Echocardiogram in December 2016 showed normal EF 55 to 60%, grade 1 DD.  Exercise Myoview in December 2016 was negative for ischemia.  She has a coronary CT in March 2018 that showed coronary calcium score 5.8 which placed her at 59th percentile for age and sex matched control, proximal LAD plaque without significant disease.  She previously underwent a left knee replacement surgery in December 2019.  She requires upcoming right total knee surgery on 08/24/2021.  She was last seen by Dr. Oval Linsey in March 2022 via virtual visit at which time she was struggling with weight loss.  Talking with the patient today, she continues to exercise on a daily basis despite knee issue.  She gets winded climbing up 19 steps to the second floor, however attributed shortness of breath to the knee issue and lack of exercise.  Otherwise, she denies any recent exertional chest pain.  She is able to walk 2 blocks away from her home and back without any exertional symptoms.  Dr. Oval Linsey, does Dr. Leigh Aurora still need to be seen or can she be cleared given good functional ability without exertional chest pain?  Please forward your response to [facility] PREOP

## 2021-05-12 NOTE — Telephone Encounter (Signed)
   Name: Melissa [name]  DOB: [birth year]  MRN: [id number]   Primary Cardiologist: Skeet Latch, MD  Chart reviewed as part of pre-operative protocol coverage. Patient was contacted 05/12/2021 in reference to pre-operative risk assessment for pending surgery as outlined below.  Melissa [name] was last seen on 10/06/2020 by Dr. Oval Linsey via virtual visit.  Since that day, Melissa [name] has done well without chest pain or worsening dyspnea.  Therefore, based on ACC/AHA guidelines, the patient would be at acceptable risk for the planned procedure without further cardiovascular testing.   Her case has been discussed with Dr. Oval Linsey who commented "She is OK for surgery without another appointment. "  The patient was advised that if she develops new symptoms prior to surgery to contact our office to arrange for a follow-up visit, and she verbalized understanding.  I will route this recommendation to the requesting party via Epic fax function and remove from pre-op pool. Please call with questions.  [place], [place] 05/12/2021, 7:08 PM

## 2021-05-12 NOTE — Telephone Encounter (Signed)
Please inform Dr. Leigh Aurora that Dr. Oval Linsey has cleared her for surgery. I forwarded the cardiac clearance back to the surgeon's office tonight.

## 2021-05-13 ENCOUNTER — Other Ambulatory Visit: Payer: Self-pay

## 2021-05-13 ENCOUNTER — Ambulatory Visit (INDEPENDENT_AMBULATORY_CARE_PROVIDER_SITE_OTHER): Payer: 59 | Admitting: Family Medicine

## 2021-05-13 ENCOUNTER — Encounter (INDEPENDENT_AMBULATORY_CARE_PROVIDER_SITE_OTHER): Payer: Self-pay | Admitting: Family Medicine

## 2021-05-13 VITALS — BP 112/73 | HR 58 | Temp 98.4°F | Ht 65.0 in | Wt 222.0 lb

## 2021-05-13 DIAGNOSIS — E65 Localized adiposity: Secondary | ICD-10-CM

## 2021-05-13 DIAGNOSIS — R7303 Prediabetes: Secondary | ICD-10-CM

## 2021-05-13 DIAGNOSIS — Z6838 Body mass index (BMI) 38.0-38.9, adult: Secondary | ICD-10-CM

## 2021-05-13 DIAGNOSIS — K5909 Other constipation: Secondary | ICD-10-CM

## 2021-05-13 NOTE — Telephone Encounter (Signed)
I s/w the pt this morning and informed her that she has been cleared. She did state that someone called her yesterday. I did apologize for the duplicate call. I reinforced that I was calling per the pre op provider yesterday Almyra Deforest, [facility] who asked for me to call her. Pt said that was ok. I did assured the pt that she has been cleared and notes have been sent to the surgeon's office. Pt was grateful for getting the good news twice that she is cleared for her surgery. Pt was very pleasant to speak with.

## 2021-05-14 ENCOUNTER — Other Ambulatory Visit (HOSPITAL_COMMUNITY): Payer: Self-pay

## 2021-05-14 MED ORDER — TIRZEPATIDE 5 MG/0.5ML ~~LOC~~ SOAJ
5.0000 mg | SUBCUTANEOUS | 0 refills | Status: DC
  Filled 2021-05-14: qty 2, 28d supply, fill #0

## 2021-05-19 NOTE — Progress Notes (Signed)
Chief Complaint:   OBESITY Melissa [name] is here to discuss her progress with her obesity treatment plan along with follow-up of her obesity related diagnoses. See Medical Weight Management Flowsheet for complete bioelectrical impedance results.  Today's visit was #: 7 Starting weight: 229 lbs Starting date: 12/09/2020 Weight change since last visit: 0 Total lbs lost to date: 7 lbs Total weight loss percentage to date: -3.06%  Nutrition Plan: Category 2 Meal Plan for 75% of the time. Activity: Cardio/strength training for 60-75 minutes 4 times per week.  Anti-obesity medications: Mounjaro 2.5 mg subcutaneously weekly. Reported side effects: Mild constipation.  Interim History: Melissa [name] is frustrated with the lack of scale movement.  She says she felt that she made a strong effort.  She has some mild constipation with Mounjaro.  Assessment/Plan:   1. Prediabetes, with polyphagia Not at goal. Goal is HgbA1c < 5.7.  Medication: Mounjaro 2.5 mg subcutaneously weekly.    Plan:  Increase Mounjaro to 5 mg subcutaneously weekly, as per below.  She will continue to focus on protein-rich, low simple carbohydrate foods. We reviewed the importance of hydration, regular exercise for stress reduction, and restorative sleep.   Lab Results  Component Value Date   HGBA1C 6.5 (H) 12/09/2020   Lab Results  Component Value Date   INSULIN 18.7 12/09/2020   - Increase tirzepatide (MOUNJARO) 5 MG/0.5ML Pen; Inject 1 pen (5 mg) into the skin once a week.  Dispense: 2 mL; Refill: 0  2. Visceral obesity Current visceral fat rating: 15. Visceral fat rating goal is < 13. Visceral adipose tissue is a hormonally active component of total body fat. This body composition phenotype is associated with medical disorders such as metabolic syndrome, cardiovascular disease, and several malignancies including prostate, breast, and colorectal cancers. Starting goal: Lose 7-10% of starting weight.   3. Other  constipation We reviewed bowel regimen today. We will continue to monitor symptoms as they relate to her weight loss journey.  4. Obesity, current BMI 37.1  Course: Melissa [name] is currently in the action stage of change. As such, her goal is to continue with weight loss efforts.   Nutrition goals: She has agreed to keeping a food journal and adhering to recommended goals of 1200 calories and 95 grams of protein.   Exercise goals: For substantial health benefits, adults should do at least 150 minutes (2 hours and 30 minutes) a week of moderate-intensity, or 75 minutes (1 hour and 15 minutes) a week of vigorous-intensity aerobic physical activity, or an equivalent combination of moderate- and vigorous-intensity aerobic activity. Aerobic activity should be performed in episodes of at least 10 minutes, and preferably, it should be spread throughout the week.  Behavioral modification strategies: increasing lean protein intake, decreasing simple carbohydrates, and increasing vegetables.  Melissa [name] has agreed to follow-up with our clinic in 4 weeks. She was informed of the importance of frequent follow-up visits to maximize her success with intensive lifestyle modifications for her multiple health conditions.   Objective:   Blood pressure 112/73, pulse (!) 58, temperature 98.4 F (36.9 C), temperature source Oral, height 5\' 5"  (1.651 m), weight 222 lb (100.7 kg), SpO2 96 %. Body mass index is 36.94 kg/m.  General: Cooperative, alert, well developed, in no acute distress. HEENT: Conjunctivae and lids unremarkable. Cardiovascular: Regular rhythm.  Lungs: Normal work of breathing. Neurologic: No focal deficits.   Lab Results  Component Value Date   CREATININE 0.96 12/09/2020   BUN 14 12/09/2020   NA 141 12/09/2020  K 4.7 12/09/2020   CL 101 12/09/2020   CO2 23 12/09/2020   Lab Results  Component Value Date   ALT 17 12/09/2020   AST 16 12/09/2020   ALKPHOS 120 12/09/2020   BILITOT 0.5  12/09/2020   Lab Results  Component Value Date   HGBA1C 6.5 (H) 12/09/2020   Lab Results  Component Value Date   INSULIN 18.7 12/09/2020   Lab Results  Component Value Date   TSH 1.340 12/09/2020   Lab Results  Component Value Date   CHOL 155 12/09/2020   HDL 68 12/09/2020   LDLCALC 75 12/09/2020   TRIG 59 12/09/2020   CHOLHDL 2.3 12/09/2020   Lab Results  Component Value Date   VD25OH 47.3 12/09/2020   Lab Results  Component Value Date   WBC 4.5 12/09/2020   HGB 14.8 12/09/2020   HCT 46.1 12/09/2020   MCV 85 12/09/2020   PLT 228 12/09/2020   Lab Results  Component Value Date   IRON 102 12/09/2020   TIBC 362 12/09/2020   FERRITIN 235 (H) 12/09/2020   Attestation Statements:   Reviewed by clinician on day of visit: allergies, medications, problem list, medical history, surgical history, family history, social history, and previous encounter notes.  I, Water quality scientist, CMA, am acting as transcriptionist for Briscoe Deutscher, DO  I have reviewed the above documentation for accuracy and completeness, and I agree with the above. -  Briscoe Deutscher, DO, MS, FAAFP, DABOM - Family and Bariatric Medicine.

## 2021-06-04 ENCOUNTER — Encounter (INDEPENDENT_AMBULATORY_CARE_PROVIDER_SITE_OTHER): Payer: Self-pay | Admitting: Family Medicine

## 2021-06-04 ENCOUNTER — Ambulatory Visit (INDEPENDENT_AMBULATORY_CARE_PROVIDER_SITE_OTHER): Payer: 59 | Admitting: Family Medicine

## 2021-06-04 ENCOUNTER — Other Ambulatory Visit (HOSPITAL_COMMUNITY): Payer: Self-pay

## 2021-06-04 ENCOUNTER — Other Ambulatory Visit: Payer: Self-pay

## 2021-06-04 VITALS — BP 102/66 | HR 48 | Temp 97.7°F | Ht 65.0 in | Wt 217.0 lb

## 2021-06-04 DIAGNOSIS — E65 Localized adiposity: Secondary | ICD-10-CM

## 2021-06-04 DIAGNOSIS — R7303 Prediabetes: Secondary | ICD-10-CM

## 2021-06-04 DIAGNOSIS — Z6838 Body mass index (BMI) 38.0-38.9, adult: Secondary | ICD-10-CM | POA: Diagnosis not present

## 2021-06-04 DIAGNOSIS — F3289 Other specified depressive episodes: Secondary | ICD-10-CM

## 2021-06-04 MED ORDER — TIRZEPATIDE 2.5 MG/0.5ML ~~LOC~~ SOAJ
2.5000 mg | SUBCUTANEOUS | 0 refills | Status: DC
  Filled 2021-06-04: qty 2, 28d supply, fill #0

## 2021-06-04 NOTE — Progress Notes (Signed)
Chief Complaint:   OBESITY Melissa [name] is here to discuss her progress with her obesity treatment plan along with follow-up of her obesity related diagnoses. See Medical Weight Management Flowsheet for complete bioelectrical impedance results.  Today's visit was #: 8 Starting weight: 229 lbs Starting date: 12/09/2020 Weight change since last visit: 5 lbs  Total lbs lost to date: 12 lbs Total weight loss percentage to date: -5.24%  Nutrition Plan: Cardio/strength training/walking for 60-75 minutes 4 times per week.  Activity: Cardio/strength training/walking for 60-75 minutes 4 times per week.  Medication: Mounjaro 5 mg [place] weekly. Doing well. Decreased polyphagia. Mild nausea at times.  Interim History: Melissa [name] is scheduled for TKA on 1/30.  Her goal is maintaining weight throughout the Thanksgiving holidays.She is worried about being unable to enjoy the holiday on her current dose of Mounjaro.  Assessment/Plan:   1. Prediabetes Not at goal. Goal is HgbA1c < 5.7.  Medication: None.    Plan:  Start Mounjaro 2.5 mg subcutaneously weekly, as per below.  She will continue to focus on protein-rich, low simple carbohydrate foods. We reviewed the importance of hydration, regular exercise for stress reduction, and restorative sleep.   Lab Results  Component Value Date   HGBA1C 6.5 (H) 12/09/2020   Lab Results  Component Value Date   INSULIN 18.7 12/09/2020   - Decrease tirzepatide (MOUNJARO) 2.5 MG/0.5ML Pen; Inject 2.5 mg into the skin once a week.  Dispense: 2 mL; Refill: 0  2. Visceral obesity Current visceral fat rating: 15. Visceral fat rating goal is < 13. Visceral adipose tissue is a hormonally active component of total body fat. This body composition phenotype is associated with medical disorders such as metabolic syndrome, cardiovascular disease, and several malignancies including prostate, breast, and colorectal cancers. Starting goal: Lose 7-10% of starting weight.   3.  Emotional eating tendencies Improving, but not optimized. Medication: None.   Plan: Discussed cues and consequences, how thoughts affect eating, model of thoughts, feelings, and behaviors, and strategies for change by focusing on the cue. Discussed cognitive distortions, coping thoughts, and how to change your thoughts.  4. Obesity, current BMI 36.1  Course: Melissa [name] is currently in the action stage of change. As such, her goal is to continue with weight loss efforts.   Nutrition goals: She has agreed to keeping a food journal and adhering to recommended goals of 1200 calories and 95 grams of protein.   Exercise goals:  As is.  Behavioral modification strategies: increasing lean protein intake, decreasing simple carbohydrates, increasing vegetables, and increasing water intake.  Melissa [name] has agreed to follow-up with our clinic in 4 weeks. She was informed of the importance of frequent follow-up visits to maximize her success with intensive lifestyle modifications for her multiple health conditions.   Objective:   Blood pressure 102/66, pulse (!) 48, temperature 97.7 F (36.5 C), temperature source Oral, height 5\' 5"  (1.651 m), weight 217 lb (98.4 kg), SpO2 99 %. Body mass index is 36.11 kg/m.  General: Cooperative, alert, well developed, in no acute distress. HEENT: Conjunctivae and lids unremarkable. Cardiovascular: Regular rhythm.  Lungs: Normal work of breathing. Neurologic: No focal deficits.   Lab Results  Component Value Date   CREATININE 0.96 12/09/2020   BUN 14 12/09/2020   NA 141 12/09/2020   K 4.7 12/09/2020   CL 101 12/09/2020   CO2 23 12/09/2020   Lab Results  Component Value Date   ALT 17 12/09/2020   AST 16 12/09/2020   ALKPHOS  120 12/09/2020   BILITOT 0.5 12/09/2020   Lab Results  Component Value Date   HGBA1C 6.5 (H) 12/09/2020   Lab Results  Component Value Date   INSULIN 18.7 12/09/2020   Lab Results  Component Value Date   TSH 1.340 12/09/2020    Lab Results  Component Value Date   CHOL 155 12/09/2020   HDL 68 12/09/2020   LDLCALC 75 12/09/2020   TRIG 59 12/09/2020   CHOLHDL 2.3 12/09/2020   Lab Results  Component Value Date   VD25OH 47.3 12/09/2020   Lab Results  Component Value Date   WBC 4.5 12/09/2020   HGB 14.8 12/09/2020   HCT 46.1 12/09/2020   MCV 85 12/09/2020   PLT 228 12/09/2020   Lab Results  Component Value Date   IRON 102 12/09/2020   TIBC 362 12/09/2020   FERRITIN 235 (H) 12/09/2020   Attestation Statements:   Reviewed by clinician on day of visit: allergies, medications, problem list, medical history, surgical history, family history, social history, and previous encounter notes.  I, Water quality scientist, CMA, am acting as transcriptionist for Briscoe Deutscher, DO  I have reviewed the above documentation for accuracy and completeness, and I agree with the above. -  Briscoe Deutscher, DO, MS, FAAFP, DABOM - Family and Bariatric Medicine.

## 2021-06-08 ENCOUNTER — Other Ambulatory Visit (HOSPITAL_COMMUNITY): Payer: Self-pay

## 2021-06-08 ENCOUNTER — Other Ambulatory Visit (INDEPENDENT_AMBULATORY_CARE_PROVIDER_SITE_OTHER): Payer: Self-pay

## 2021-06-08 DIAGNOSIS — R7303 Prediabetes: Secondary | ICD-10-CM

## 2021-06-08 MED ORDER — TIRZEPATIDE 5 MG/0.5ML ~~LOC~~ SOAJ
5.0000 mg | SUBCUTANEOUS | 0 refills | Status: DC
  Filled 2021-06-08: qty 2, 28d supply, fill #0

## 2021-06-09 DIAGNOSIS — C50912 Malignant neoplasm of unspecified site of left female breast: Secondary | ICD-10-CM | POA: Diagnosis not present

## 2021-06-16 ENCOUNTER — Other Ambulatory Visit (HOSPITAL_COMMUNITY): Payer: Self-pay

## 2021-06-25 ENCOUNTER — Other Ambulatory Visit (HOSPITAL_COMMUNITY): Payer: Self-pay

## 2021-07-07 ENCOUNTER — Ambulatory Visit (INDEPENDENT_AMBULATORY_CARE_PROVIDER_SITE_OTHER): Payer: 59 | Admitting: Family Medicine

## 2021-07-07 ENCOUNTER — Other Ambulatory Visit (HOSPITAL_COMMUNITY): Payer: Self-pay

## 2021-07-07 ENCOUNTER — Encounter (INDEPENDENT_AMBULATORY_CARE_PROVIDER_SITE_OTHER): Payer: Self-pay | Admitting: Family Medicine

## 2021-07-07 ENCOUNTER — Other Ambulatory Visit: Payer: Self-pay

## 2021-07-07 VITALS — BP 96/57 | HR 59 | Temp 97.9°F | Ht 65.0 in | Wt 212.0 lb

## 2021-07-07 DIAGNOSIS — E7849 Other hyperlipidemia: Secondary | ICD-10-CM

## 2021-07-07 DIAGNOSIS — E65 Localized adiposity: Secondary | ICD-10-CM

## 2021-07-07 DIAGNOSIS — R7303 Prediabetes: Secondary | ICD-10-CM

## 2021-07-07 DIAGNOSIS — Z6838 Body mass index (BMI) 38.0-38.9, adult: Secondary | ICD-10-CM | POA: Diagnosis not present

## 2021-07-07 MED ORDER — TIRZEPATIDE 2.5 MG/0.5ML ~~LOC~~ SOAJ
2.5000 mg | SUBCUTANEOUS | 0 refills | Status: DC
  Filled 2021-07-07: qty 2, 28d supply, fill #0

## 2021-07-07 MED ORDER — TIRZEPATIDE 5 MG/0.5ML ~~LOC~~ SOAJ
5.0000 mg | SUBCUTANEOUS | 0 refills | Status: DC
  Filled 2021-07-07 – 2021-07-28 (×2): qty 2, 28d supply, fill #0

## 2021-07-08 ENCOUNTER — Other Ambulatory Visit (HOSPITAL_COMMUNITY): Payer: Self-pay

## 2021-07-08 NOTE — Progress Notes (Signed)
Chief Complaint:   OBESITY Melissa [name] is here to discuss her progress with her obesity treatment plan along with follow-up of her obesity related diagnoses. See Medical Weight Management Flowsheet for complete bioelectrical impedance results.  Today's visit was #: 9 Starting weight: 229 lbs Starting date: 12/09/2020 Weight change since last visit: 5 lbs Total lbs lost to date: 17 lbs Total weight loss percentage to date: -7.42%  Nutrition Plan: Keeping a food journal and adhering to recommended goals of 1200 calories and 95 grams of protein daily for 10% of the time. Activity: Cardio/strength training for 60-75 minutes 4 times per week.  Anti-obesity medications: Mounjaro 5 mg subcutaneously weekly. Reported side effects: None.  Interim History: Melissa [name] says she is happy with her progress.  She has had some slight nausea with the increase in her dose of Mounjaro.  Assessment/Plan:   1. Prediabetes, with polyphagia Not at goal. Goal is HgbA1c < 5.7.  Medication: Mounjaro 5 mg subcutaneously weekly.    Plan:  Continue Mounjaro.  Will refill today.  She will continue to focus on protein-rich, low simple carbohydrate foods. We reviewed the importance of hydration, regular exercise for stress reduction, and restorative sleep.   Lab Results  Component Value Date   HGBA1C 6.5 (H) 12/09/2020   Lab Results  Component Value Date   INSULIN 18.7 12/09/2020   - Refill tirzepatide (MOUNJARO) 2.5 MG/0.5ML Pen; Inject 2.5 mg into the skin once a week.  Dispense: 2 mL; Refill: 0 - Refill tirzepatide (MOUNJARO) 5 MG/0.5ML Pen; Inject 5 mg into the skin once a week.  Dispense: 2 mL; Refill: 0  2. Visceral obesity Current visceral fat rating: 15. Visceral fat rating goal is < 13. Visceral adipose tissue is a hormonally active component of total body fat. This body composition phenotype is associated with medical disorders such as metabolic syndrome, cardiovascular disease, and several malignancies  including prostate, breast, and colorectal cancers. Starting goal: Lose 7-10% of starting weight.   3. Other hyperlipidemia Course: At goal. Lipid-lowering medications: Crestor 10 mg daily.   Plan: Dietary changes: Increase soluble fiber, decrease simple carbohydrates, decrease saturated fat. Exercise changes: Moderate to vigorous-intensity aerobic activity 150 minutes per week or as tolerated. We will continue to monitor along with PCP/specialists as it pertains to her weight loss journey.  Lab Results  Component Value Date   CHOL 155 12/09/2020   HDL 68 12/09/2020   LDLCALC 75 12/09/2020   TRIG 59 12/09/2020   CHOLHDL 2.3 12/09/2020   Lab Results  Component Value Date   ALT 17 12/09/2020   AST 16 12/09/2020   ALKPHOS 120 12/09/2020   BILITOT 0.5 12/09/2020   The 10-year ASCVD risk score (Arnett DK, et al., 2019) is: 4.9%   Values used to calculate the score:     Age: 70 years     Sex: Female     Is Non-Hispanic African American: Yes     Diabetic: No     Tobacco smoker: No     Systolic Blood Pressure: 96 mmHg     Is BP treated: No     HDL Cholesterol: 68 mg/dL     Total Cholesterol: 155 mg/dL  4. Obesity, current BMI 35.3  Course: Melissa [name] is currently in the action stage of change. As such, her goal is to continue with weight loss efforts.   Nutrition goals: She has agreed to keeping a food journal and adhering to recommended goals of 1200 calories and 95 grams of protein.  Exercise goals:  As is.  Behavioral modification strategies: increasing lean protein intake, decreasing simple carbohydrates, increasing vegetables, and increasing water intake.  Melissa [name] has agreed to follow-up with our clinic in 4 weeks. She was informed of the importance of frequent follow-up visits to maximize her success with intensive lifestyle modifications for her multiple health conditions.   Objective:   Blood pressure (!) 96/57, pulse (!) 59, temperature 97.9 F (36.6 C), temperature  source Oral, height 5\' 5"  (1.651 m), weight 212 lb (96.2 kg), SpO2 100 %. Body mass index is 35.28 kg/m.  General: Cooperative, alert, well developed, in no acute distress. HEENT: Conjunctivae and lids unremarkable. Cardiovascular: Regular rhythm.  Lungs: Normal work of breathing. Neurologic: No focal deficits.   Lab Results  Component Value Date   CREATININE 0.96 12/09/2020   BUN 14 12/09/2020   NA 141 12/09/2020   K 4.7 12/09/2020   CL 101 12/09/2020   CO2 23 12/09/2020   Lab Results  Component Value Date   ALT 17 12/09/2020   AST 16 12/09/2020   ALKPHOS 120 12/09/2020   BILITOT 0.5 12/09/2020   Lab Results  Component Value Date   HGBA1C 6.5 (H) 12/09/2020   Lab Results  Component Value Date   INSULIN 18.7 12/09/2020   Lab Results  Component Value Date   TSH 1.340 12/09/2020   Lab Results  Component Value Date   CHOL 155 12/09/2020   HDL 68 12/09/2020   LDLCALC 75 12/09/2020   TRIG 59 12/09/2020   CHOLHDL 2.3 12/09/2020   Lab Results  Component Value Date   VD25OH 47.3 12/09/2020   Lab Results  Component Value Date   WBC 4.5 12/09/2020   HGB 14.8 12/09/2020   HCT 46.1 12/09/2020   MCV 85 12/09/2020   PLT 228 12/09/2020   Lab Results  Component Value Date   IRON 102 12/09/2020   TIBC 362 12/09/2020   FERRITIN 235 (H) 12/09/2020   Attestation Statements:   Reviewed by clinician on day of visit: allergies, medications, problem list, medical history, surgical history, family history, social history, and previous encounter notes.  I, Water quality scientist, CMA, am acting as transcriptionist for Briscoe Deutscher, DO  I have reviewed the above documentation for accuracy and completeness, and I agree with the above. -  Briscoe Deutscher, DO, MS, FAAFP, DABOM - Family and Bariatric Medicine.

## 2021-07-09 ENCOUNTER — Ambulatory Visit (INDEPENDENT_AMBULATORY_CARE_PROVIDER_SITE_OTHER): Payer: Medicare Other | Admitting: Family Medicine

## 2021-07-27 ENCOUNTER — Other Ambulatory Visit (HOSPITAL_COMMUNITY): Payer: Self-pay

## 2021-07-27 ENCOUNTER — Other Ambulatory Visit (INDEPENDENT_AMBULATORY_CARE_PROVIDER_SITE_OTHER): Payer: Self-pay | Admitting: Family Medicine

## 2021-07-27 DIAGNOSIS — R7303 Prediabetes: Secondary | ICD-10-CM

## 2021-07-28 ENCOUNTER — Other Ambulatory Visit (HOSPITAL_COMMUNITY): Payer: Self-pay

## 2021-07-28 MED ORDER — CEPHALEXIN 250 MG PO CAPS
ORAL_CAPSULE | ORAL | 3 refills | Status: DC
  Filled 2021-07-28: qty 40, 90d supply, fill #0

## 2021-07-28 NOTE — Telephone Encounter (Signed)
LOV w/ Wallace

## 2021-08-10 NOTE — Patient Instructions (Addendum)
DUE TO COVID-19 ONLY ONE VISITOR IS ALLOWED TO COME WITH YOU AND STAY IN THE WAITING ROOM ONLY DURING PRE OP AND PROCEDURE.   **NO VISITORS ARE ALLOWED IN THE SHORT STAY AREA OR RECOVERY ROOM!!**  IF YOU WILL BE ADMITTED INTO THE HOSPITAL YOU ARE ALLOWED ONLY TWO SUPPORT PEOPLE DURING VISITATION HOURS ONLY (7 AM -8PM)   The support person(s) must pass our screening, gel in and out, and wear a mask at all times, including in the patients room. Patients must also wear a mask when staff or their support person are in the room. Visitors GUEST BADGE MUST BE WORN VISIBLY  One adult visitor may remain with you overnight and MUST be in the room by 8 P.M.  No visitors under the age of 58. Any visitor under the age of 32 must be accompanied by an adult.    COVID SWAB TESTING MUST BE COMPLETED ON:  08/20/21 @ 8:30 AM   Site: [facility] [place] Enter: Main Entrance have a seat in the waiting area to the right of main entrance (DO NOT [place]!!!!!) Dial: [phone] to alert staff you have arrived  You are not required to quarantine, however you are required to wear a well-fitted mask when you are out and around people not in your household.  Hand Hygiene often Do NOT share personal items Notify your provider if you are in close contact with someone who has COVID or you develop fever 100.4 or greater, new onset of sneezing, cough, sore throat, shortness of breath or body aches.   Your procedure is scheduled on: 08/24/21   Report to [facility] Main Entrance    Report to admitting at 5:15 AM   Call this number if you have problems the morning of surgery [phone]   Do not eat food :After Midnight.   May have liquids until 4:15 AM day of surgery  CLEAR LIQUID DIET  Foods Allowed                                                                     Foods Excluded  Water, Black Coffee and tea (no milk or creamer)           liquids that  you cannot  Plain Jell-O in any flavor  (No red)                                    see through such as: Fruit ices (not with fruit pulp)                                            milk, soups, orange juice              Iced Popsicles (No red)  All solid food                                   Apple juices Sports drinks like Gatorade (No red) Lightly seasoned clear broth or consume(fat free) Sugar     The day of surgery:  Drink ONE (1) Pre-Surgery G2 at 4:15 AM the morning of surgery. Drink in one sitting. Do not sip.  This drink was given to you during your hospital  pre-op appointment visit. Nothing else to drink after completing the  Pre-Surgery G2.          If you have questions, please contact your surgeons office.     Oral Hygiene is also important to reduce your risk of infection.                                    Remember - BRUSH YOUR TEETH THE MORNING OF SURGERY WITH YOUR REGULAR TOOTHPASTE   Stop all vitamins and supplements 5 days before surgery  Take these medicines the morning of surgery with A SIP OF WATER: Keflex, Crestor                              You may not have any metal on your body including hair pins, jewelry, and body piercing             Do not wear make-up, lotions, powders, perfumes/cologne, or deodorant  Do not wear nail polish including gel and S&S, artificial/acrylic nails, or any other type of covering on natural nails including finger and toenails. If you have artificial nails, gel coating, etc. that needs to be removed by a nail salon please have this removed prior to surgery or surgery may need to be canceled/ delayed if the surgeon/ anesthesia feels like they are unable to be safely monitored.   Do not shave  48 hours prior to surgery.               Men may shave face and neck.   Do not bring valuables to the hospital. [place].   Bring small  overnight bag day of surgery.   Special Instructions: Bring a copy of your healthcare power of attorney and living will documents         the day of surgery if you haven't scanned them before.              Please read over the following fact sheets you were given: IF YOU HAVE QUESTIONS ABOUT YOUR PRE-OP INSTRUCTIONS PLEASE CALL [facility] - Preparing for Surgery Before surgery, you can play an important role.  Because skin is not sterile, your skin needs to be as free of germs as possible.  You can reduce the number of germs on your skin by washing with CHG (chlorahexidine gluconate) soap before surgery.  CHG is an antiseptic cleaner which kills germs and bonds with the skin to continue killing germs even after washing. Please DO NOT use if you have an allergy to CHG or antibacterial soaps.  If your skin becomes reddened/irritated stop using the CHG and inform your nurse when you arrive at Short Stay. Do  not shave (including legs and underarms) for at least 48 hours prior to the first CHG shower.  You may shave your face/neck.  Please follow these instructions carefully:  1.  Shower with CHG Soap the night before surgery and the  morning of surgery.  2.  If you choose to wash your hair, wash your hair first as usual with your normal  shampoo.  3.  After you shampoo, rinse your hair and body thoroughly to remove the shampoo.                             4.  Use CHG as you would any other liquid soap.  You can apply chg directly to the skin and wash.  Gently with a scrungie or clean washcloth.  5.  Apply the CHG Soap to your body ONLY FROM THE NECK DOWN.   Do   not use on face/ open                           Wound or open sores. Avoid contact with eyes, ears mouth and   genitals (private parts).                       Wash face,  Genitals (private parts) with your normal soap.             6.  Wash thoroughly, paying special attention to the area where your    surgery  will be  performed.  7.  Thoroughly rinse your body with warm water from the neck down.  8.  DO NOT shower/wash with your normal soap after using and rinsing off the CHG Soap.                9.  Pat yourself dry with a clean towel.            10.  Wear clean pajamas.            11.  Place clean sheets on your bed the night of your first shower and do not  sleep with pets. Day of Surgery : Do not apply any lotions/deodorants the morning of surgery.  Please wear clean clothes to the hospital/surgery center.  FAILURE TO FOLLOW THESE INSTRUCTIONS MAY RESULT IN THE CANCELLATION OF YOUR SURGERY  PATIENT SIGNATURE_________________________________  NURSE SIGNATURE__________________________________  ________________________________________________________________________   Adam Phenix  An incentive spirometer is a tool that can help keep your lungs clear and active. This tool measures how well you are filling your lungs with each breath. Taking long deep breaths may help reverse or decrease the chance of developing breathing (pulmonary) problems (especially infection) following: A long period of time when you are unable to move or be active. BEFORE THE PROCEDURE  If the spirometer includes an indicator to show your best effort, your nurse or respiratory therapist will set it to a desired goal. If possible, sit up straight or lean slightly forward. Try not to slouch. Hold the incentive spirometer in an upright position. INSTRUCTIONS FOR USE  Sit on the edge of your bed if possible, or sit up as far as you can in bed or on a chair. Hold the incentive spirometer in an upright position. Breathe out normally. Place the mouthpiece in your mouth and seal your lips tightly around it. Breathe in slowly and as deeply as possible, raising the piston or the ball  toward the top of the column. Hold your breath for 3-5 seconds or for as long as possible. Allow the piston or ball to fall to the bottom of the  column. Remove the mouthpiece from your mouth and breathe out normally. Rest for a few seconds and repeat Steps 1 through 7 at least 10 times every 1-2 hours when you are awake. Take your time and take a few normal breaths between deep breaths. The spirometer may include an indicator to show your best effort. Use the indicator as a goal to work toward during each repetition. After each set of 10 deep breaths, practice coughing to be sure your lungs are clear. If you have an incision (the cut made at the time of surgery), support your incision when coughing by placing a pillow or rolled up towels firmly against it. Once you are able to get out of bed, walk around indoors and cough well. You may stop using the incentive spirometer when instructed by your caregiver.  RISKS AND COMPLICATIONS Take your time so you do not get dizzy or light-headed. If you are in pain, you may need to take or ask for pain medication before doing incentive spirometry. It is harder to take a deep breath if you are having pain. AFTER USE Rest and breathe slowly and easily. It can be helpful to keep track of a log of your progress. Your caregiver can provide you with a simple table to help with this. If you are using the spirometer at home, follow these instructions: [place] IF:  You are having difficultly using the spirometer. You have trouble using the spirometer as often as instructed. Your pain medication is not giving enough relief while using the spirometer. You develop fever of 100.5 F (38.1 C) or higher. SEEK IMMEDIATE MEDICAL CARE IF:  You cough up bloody sputum that had not been present before. You develop fever of 102 F (38.9 C) or greater. You develop worsening pain at or near the incision site. MAKE SURE YOU:  Understand these instructions. Will watch your condition. Will get help right away if you are not doing well or get worse. Document Released: 11/22/2006 Document Revised: 10/04/2011  Document Reviewed: 01/23/2007 [facility] Patient Information 2014 [place], [place].   ________________________________________________________________________

## 2021-08-10 NOTE — Progress Notes (Addendum)
COVID swab appointment: 08/20/21 @ 0830  COVID Vaccine Completed: yes x3 Date COVID Vaccine completed: 08/05/19, 09/07/19 Has received booster: 05/06/20 COVID vaccine manufacturer: Pfizer      Date of COVID positive in last 90 days: no  PCP - Josetta Huddle, MD Cardiologist - Skeet Latch, MD  Cardiac clearance by Almyra Deforest 05/12/21 in [place] clearance by Josetta Huddle 05/12/21 on chart  Chest x-ray - n/a EKG - 12/09/20 Epic Stress Test - 2016 ECHO - 2016 Cardiac Cath - n/a Pacemaker/ICD device last checked: n/a Spinal Cord Stimulator: n/a  Sleep Study - n/a CPAP -   Fasting Blood Sugar - pre DM, no checks at home Checks Blood Sugar _____ times a day  Blood Thinner Instructions: n/a Aspirin Instructions: Last Dose:  Activity level: Can go up a flight of stairs and perform activities of daily living without stopping and without symptoms of chest pain or shortness of breath. Mopping SOB    Anesthesia review: preDM, SOB, coronary artery calcification   Patient denies shortness of breath, fever, cough and chest pain at PAT appointment   Patient verbalized understanding of instructions that were given to them at the PAT appointment. Patient was also instructed that they will need to review over the PAT instructions again at home before surgery.

## 2021-08-11 ENCOUNTER — Encounter (HOSPITAL_COMMUNITY): Payer: Self-pay

## 2021-08-11 ENCOUNTER — Other Ambulatory Visit: Payer: Self-pay

## 2021-08-11 ENCOUNTER — Encounter (HOSPITAL_COMMUNITY)
Admission: RE | Admit: 2021-08-11 | Discharge: 2021-08-11 | Disposition: A | Discharge status: Home / Self Care | Payer: 59 | Source: Ambulatory Visit | Attending: Orthopedic Surgery | Admitting: Orthopedic Surgery

## 2021-08-11 VITALS — BP 110/76 | HR 69 | Temp 98.0°F | Resp 18 | Ht 65.0 in | Wt 213.1 lb

## 2021-08-11 DIAGNOSIS — M1711 Unilateral primary osteoarthritis, right knee: Secondary | ICD-10-CM | POA: Insufficient documentation

## 2021-08-11 DIAGNOSIS — Z20822 Contact with and (suspected) exposure to covid-19: Secondary | ICD-10-CM | POA: Insufficient documentation

## 2021-08-11 DIAGNOSIS — R7303 Prediabetes: Secondary | ICD-10-CM | POA: Insufficient documentation

## 2021-08-11 DIAGNOSIS — Z01812 Encounter for preprocedural laboratory examination: Secondary | ICD-10-CM | POA: Diagnosis present

## 2021-08-11 DIAGNOSIS — Z01818 Encounter for other preprocedural examination: Secondary | ICD-10-CM

## 2021-08-11 LAB — COMPREHENSIVE METABOLIC PANEL
ALT: 20 U/L (ref 0–44)
AST: 22 U/L (ref 15–41)
Albumin: 4.3 g/dL (ref 3.5–5.0)
Alkaline Phosphatase: 82 U/L (ref 38–126)
Anion gap: 5 (ref 5–15)
BUN: 14 mg/dL (ref 8–23)
CO2: 26 mmol/L (ref 22–32)
Calcium: 9.4 mg/dL (ref 8.9–10.3)
Chloride: 106 mmol/L (ref 98–111)
Creatinine, Ser: 1 mg/dL (ref 0.44–1.00)
GFR, Estimated: >60 mL/min (ref >60)
Glucose, Bld: 81 mg/dL (ref 70–99)
Potassium: 4.4 mmol/L (ref 3.5–5.1)
Sodium: 137 mmol/L (ref 135–145)
Total Bilirubin: 1 mg/dL (ref 0.3–1.2)
Total Protein: 7.4 g/dL (ref 6.5–8.1)

## 2021-08-11 LAB — CBC
HCT: 44.2 % (ref 36.0–46.0)
Hemoglobin: 14.1 g/dL (ref 12.0–15.0)
MCH: 27.4 pg (ref 26.0–34.0)
MCHC: 31.9 g/dL (ref 30.0–36.0)
MCV: 85.8 fL (ref 80.0–100.0)
Platelets: 228 10*3/uL (ref 150–400)
RBC: 5.15 MIL/uL — ABNORMAL HIGH (ref 3.87–5.11)
RDW: 15.9 % — ABNORMAL HIGH (ref 11.5–15.5)
WBC: 4.9 10*3/uL (ref 4.0–10.5)
nRBC: 0 % (ref 0.0–0.2)

## 2021-08-11 LAB — HEMOGLOBIN A1C
Hgb A1c MFr Bld: 5.8 % — ABNORMAL HIGH (ref 4.8–5.6)
Mean Plasma Glucose: 119.76 mg/dL

## 2021-08-11 LAB — SURGICAL PCR SCREEN
MRSA, PCR: NEGATIVE
Staphylococcus aureus: NEGATIVE

## 2021-08-11 LAB — PROTIME-INR
INR: 1 (ref 0.8–1.2)
Prothrombin Time: 13 seconds (ref 11.4–15.2)

## 2021-08-11 LAB — GLUCOSE, CAPILLARY: Glucose-Capillary: 86 mg/dL (ref 70–99)

## 2021-08-13 IMAGING — MG MM DIGITAL SCREENING BILAT W/ TOMO AND CAD
6 of 10 series · 6 of 30 positions shown · non-contrast
Comparison: Previous exam(s).

CLINICAL DATA: Screening.

EXAM:
DIGITAL SCREENING BILATERAL MAMMOGRAM WITH TOMOSYNTHESIS AND CAD
TECHNIQUE: Bilateral screening digital craniocaudal and mediolateral oblique
mammograms were obtained. Bilateral screening digital breast
tomosynthesis was performed. The images were evaluated with
computer-aided detection.

[R MLO synth-2D]
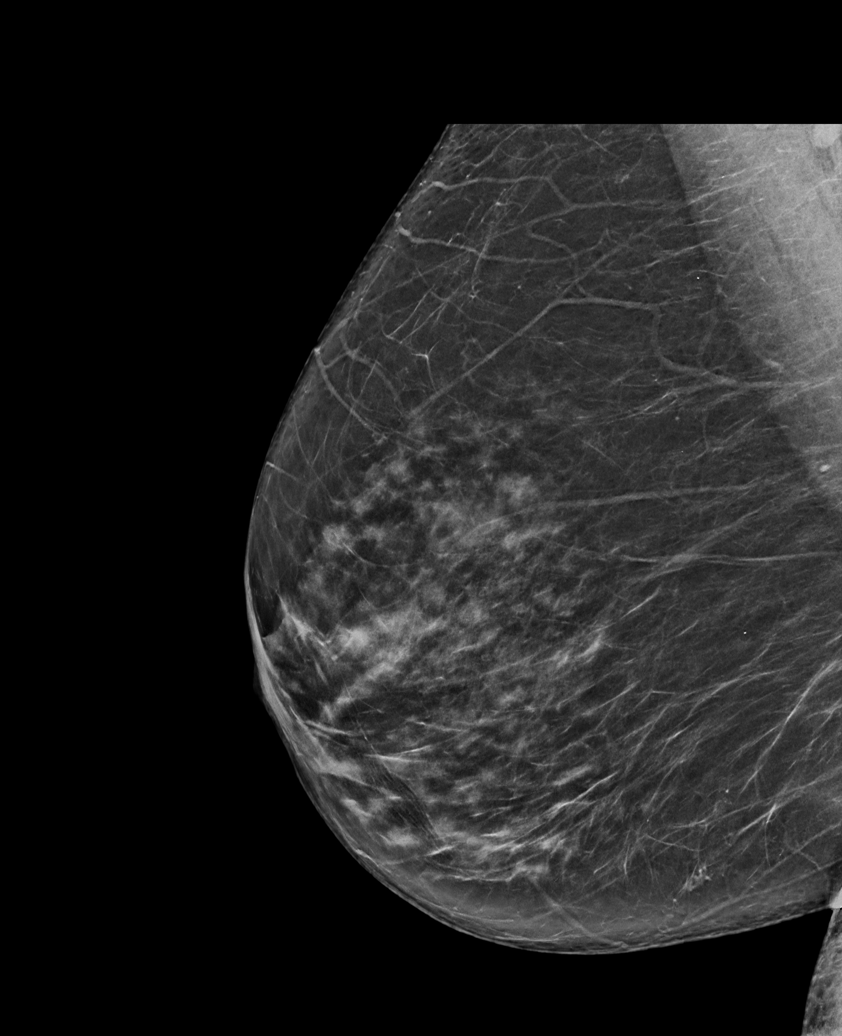

[L CC synth-2D (1 of 2)]
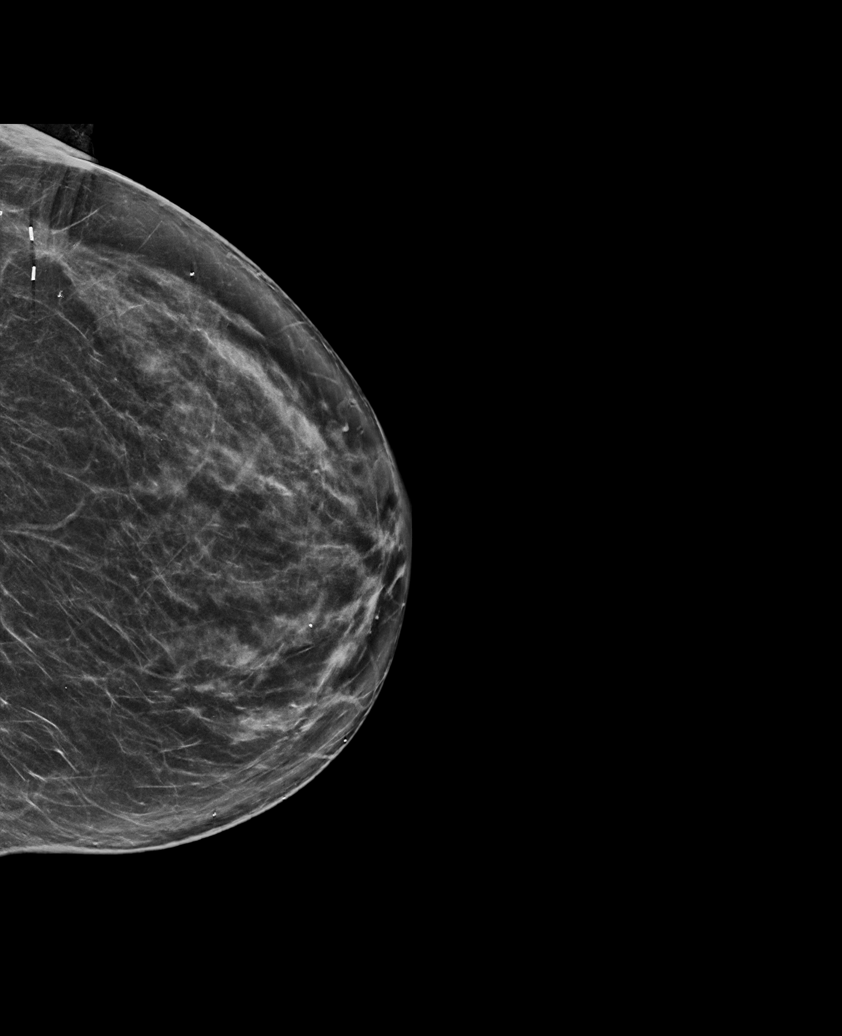

[L MLO synth-2D]
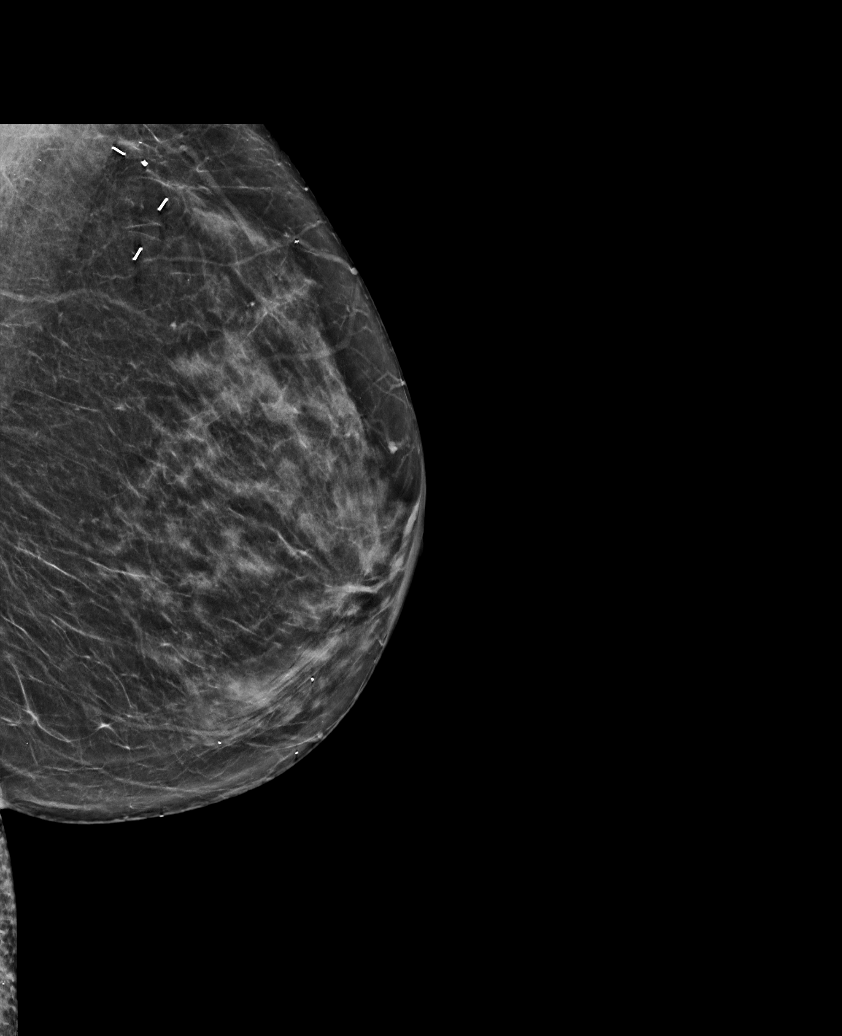

[L CC synth-2D (2 of 2)]
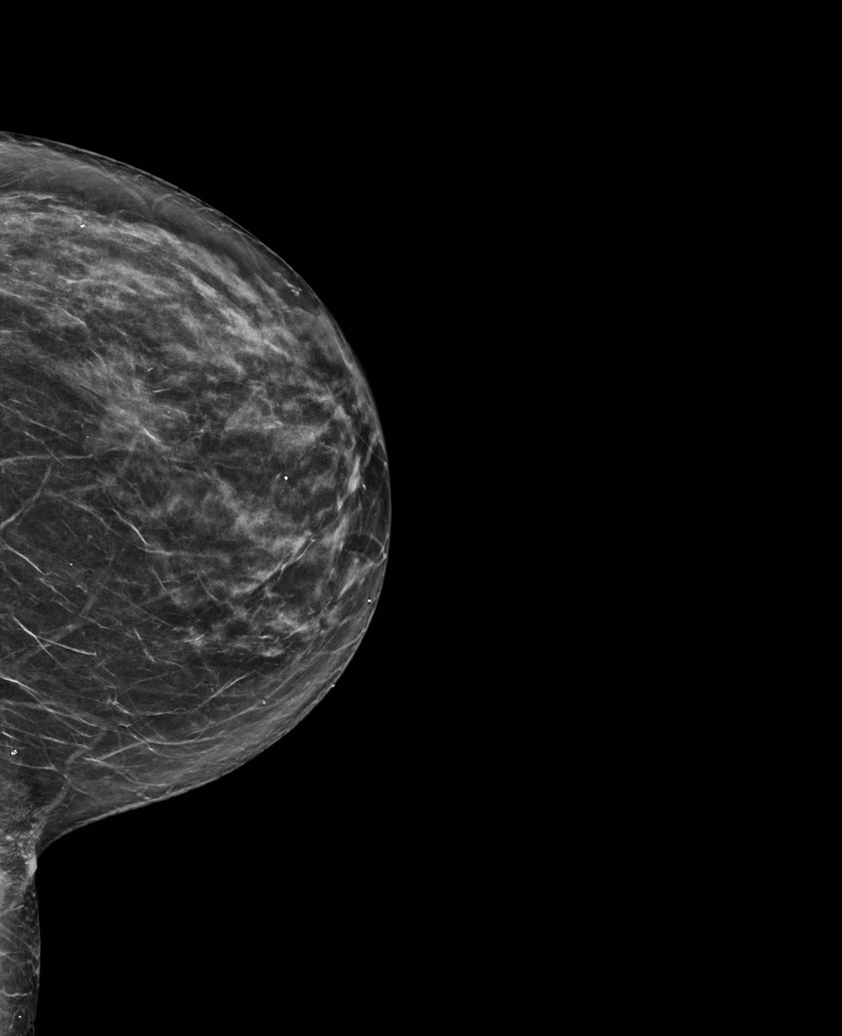

[R CC synth-2D]
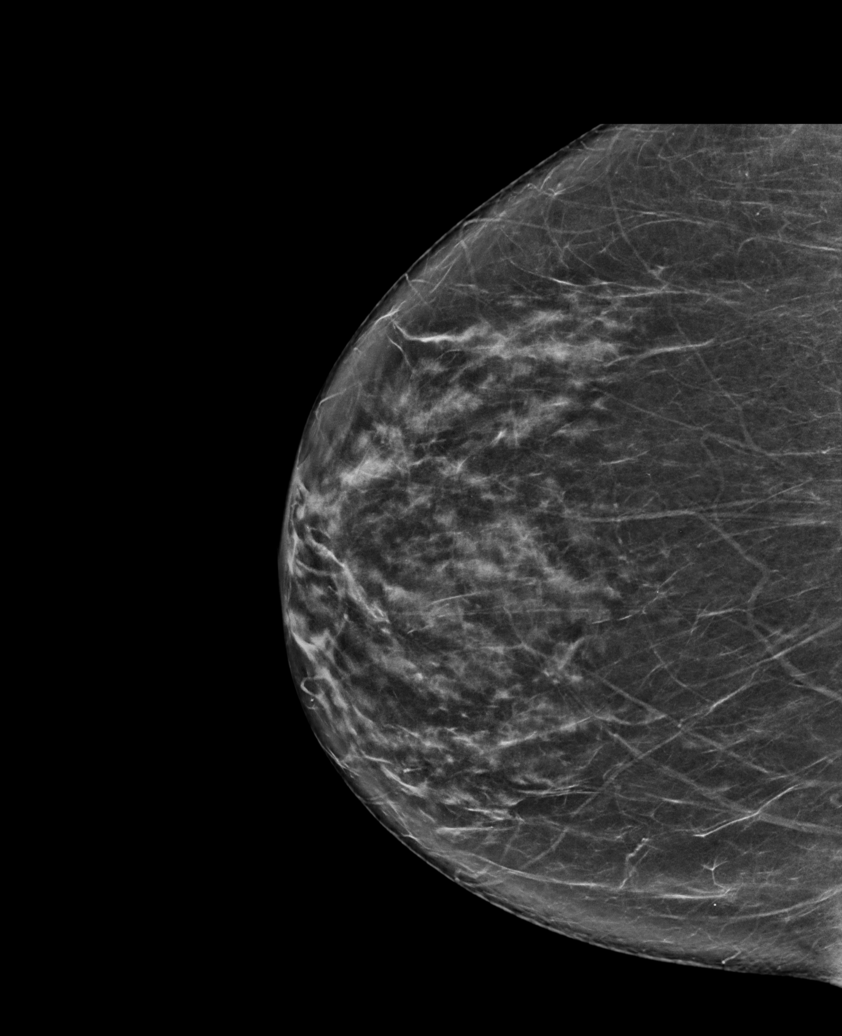

[L CC tomo · tomo slice 37/73.0]
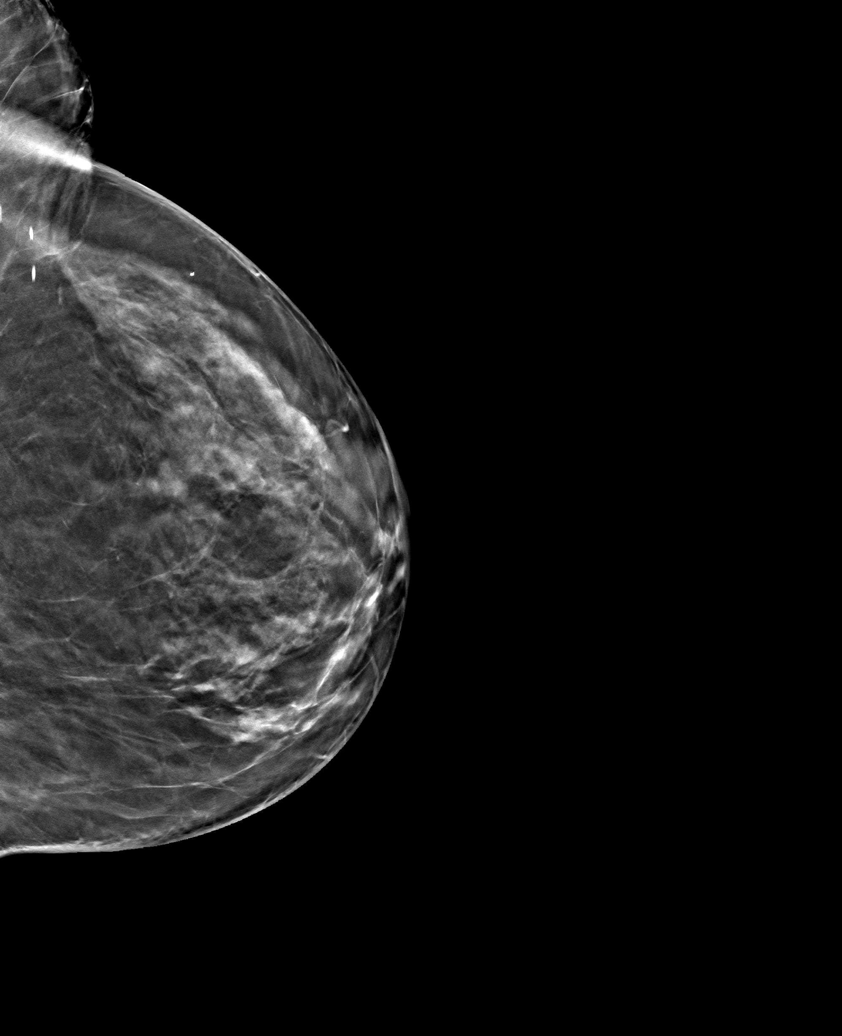

[6 of 30 positions shown; findings below may reference images not displayed]

ACR Breast Density Category c: The breast tissue is heterogeneously
dense, which may obscure small masses.
FINDINGS: There are no findings suspicious for malignancy.
IMPRESSION: No mammographic evidence of malignancy. A result letter of this
screening mammogram will be mailed directly to the patient.

RECOMMENDATION:
Screening mammogram in one year. (Code:Q3-W-BC3)

BI-RADS CATEGORY  1: Negative.

## 2021-08-14 NOTE — Progress Notes (Signed)
Anesthesia Chart Review   Case: [id number] Date/Time: 08/24/21 0700   Procedure: TOTAL KNEE ARTHROPLASTY (Right: Knee)   Anesthesia type: Choice   Pre-op diagnosis: right knee osteoarthritis   Location: [place] 09 / [facility]   Surgeons: Gaynelle Arabian, MD       DISCUSSION:70 y.o. never smoker with h/o DVT, breast cancer, right knee OA scheduled for above procedure 08/24/2021 with Dr. Gaynelle Arabian.   Per cardiology preoperative evaluation 05/12/2021, "Chart reviewed as part of pre-operative protocol coverage. Patient was contacted 05/12/2021 in reference to pre-operative risk assessment for pending surgery as outlined below.  Melissa [name] was last seen on 10/06/2020 by Dr. Oval Linsey via virtual visit.  Since that day, Melissa [name] has done well without chest pain or worsening dyspnea.   Therefore, based on ACC/AHA guidelines, the patient would be at acceptable risk for the planned procedure without further cardiovascular testing.    Her case has been discussed with Dr. Oval Linsey who commented "She is OK for surgery without another appointment. "  Anticipate pt can proceed with planned procedure barring acute status change.   VS: BP 110/76    Pulse 69    Temp 36.7 C (Oral)    Resp 18    Ht 5\' 5"  (1.651 m)    Wt 96.7 kg    SpO2 97%    BMI 35.47 kg/m   PROVIDERS: Josetta Huddle, MD is PCP   Skeet Latch, MD is Cardiologist  LABS: Labs reviewed: Acceptable for surgery. (all labs ordered are listed, but only abnormal results are displayed)  Labs Reviewed  CBC - Abnormal; Notable for the following components:      Result Value   RBC 5.15 (*)    RDW 15.9 (*)    All other components within normal limits  HEMOGLOBIN A1C - Abnormal; Notable for the following components:   Hgb A1c MFr Bld 5.8 (*)    All other components within normal limits  SURGICAL PCR SCREEN  COMPREHENSIVE METABOLIC PANEL  PROTIME-INR  GLUCOSE, CAPILLARY     IMAGES:   EKG: 12/09/2020 Rate 69 bpm  Sinus   Rhythm  -  T-abnormality  -Possible  Anterior and inferior  ischemia.    CV: Myocardial Perfusion 07/16/2015 The left ventricular ejection fraction is normal (55-65%). Nuclear stress EF: 57%. There was no ST segment deviation noted during stress. This is a low risk study.   Low risk stress nuclear study with a small, mild, fixed apical defect consistent with soft tissue attenuation/apical thinning; no ischemia; EF 57 with normal wall motion.  Echo 07/08/2015 - Left ventricle: The cavity size was normal. Wall thickness was    normal. Systolic function was normal. The estimated ejection    fraction was in the range of 55% to 60%. Wall motion was normal;    there were no regional wall motion abnormalities. Doppler    parameters are consistent with abnormal left ventricular    relaxation (grade 1 diastolic dysfunction).  - Aortic valve: There was no stenosis. There was trivial    regurgitation.  - Aorta: Mildly dilated aortic root. Aortic root dimension: 38 mm    (ED).  - Mitral valve: There was mild regurgitation.  - Right ventricle: The cavity size was normal. Systolic function    was normal.  - Tricuspid valve: Peak RV-RA gradient (S): 29 mm Hg.  - Pulmonary arteries: PA peak pressure: 32 mm Hg (S).  - Inferior vena cava: The vessel was normal in size. The  respirophasic diameter changes were in the normal range (>= 50%),    consistent with normal central venous pressure. Past Medical History:  Diagnosis Date   Anemia    AR (aortic regurgitation) 06/2015   Trivial, Noted on ECHO   Breast cancer ([place]) 07/23/97   left, tx w/xrt, Tamoxifen x 5 yrs   Chronic pain    left knee   Constipation    Coronary artery calcification 04/10/2020   Mild proximal LAD seen on coronary CT-A in 2018   DDD (degenerative disc disease), lumbar 41/32/4401   xray   Diastolic dysfunction without heart failure 09/01/2015   Grade 1 diastolic dysfunction on echo 06/2015   Diverticulitis    pt  unaware   DVT complicating pregnancy    Right calf, surface   Foot pain, bilateral    Gallbladder problem    H/O blood clots    History of bladder infections    History of endometriosis    Hx of radiation therapy 08/22/97 - 10/03/97   left breast   Joint pain    Knee pain    MR (mitral regurgitation) 06/2015   Noted on ECHO   OA (osteoarthritis)    Bilateral Knees   Obesity    Obesity (BMI 30-39.9) 04/10/2020   Personal history of radiation therapy    Prediabetes    Pure hypercholesterolemia 04/10/2020   Recurrent breast cancer ([place]) 01/04/12   biopsy, ER/PR+, Her 2 -   Shortness of breath 06/26/2015, 09/07/2016   TR (tricuspid regurgitation) 06/2015   Noted on ECHO   Vitamin D deficiency     Past Surgical History:  Procedure Laterality Date   ABDOMINAL HYSTERECTOMY  1987   endometriosos   BREAST BIOPSY Left 01/04/2012   BREAST LUMPECTOMY  02/14/2012   LUMPECTOMY;  Surgeon: Haywood Lasso, MD;  Location: [place];  Service: General;  Laterality: Left;   BREAST SURGERY  1999   lumpectomy-left   CESAREAN SECTION     x 3   CHOLECYSTECTOMY  1993   COLONOSCOPY     HERNIA REPAIR  0272   umbilical    ovary removed  [place] Left 07/10/2018   Procedure: LEFT TOTAL KNEE ARTHROPLASTY;  Surgeon: Gaynelle Arabian, MD;  Location: [facility];  Service: Orthopedics;  Laterality: Left;  69min    MEDICATIONS:  cephALEXin (KEFLEX) 250 MG capsule   cholecalciferol (VITAMIN D3) 25 MCG (1000 UT) tablet   estradiol (ESTRACE) 0.1 MG/GM vaginal cream   letrozole (FEMARA) 2.5 MG tablet   rosuvastatin (CRESTOR) 10 MG tablet   tirzepatide (MOUNJARO) 2.5 MG/0.5ML Pen   tirzepatide (MOUNJARO) 5 MG/0.5ML Pen   No current facility-administered medications for this encounter.   Konrad Felix Ward, PA-C [facility] Pre-Surgical Testing [phone]

## 2021-08-18 ENCOUNTER — Other Ambulatory Visit: Payer: Self-pay

## 2021-08-18 ENCOUNTER — Encounter (INDEPENDENT_AMBULATORY_CARE_PROVIDER_SITE_OTHER): Payer: Self-pay | Admitting: Family Medicine

## 2021-08-18 ENCOUNTER — Other Ambulatory Visit (HOSPITAL_COMMUNITY): Payer: Self-pay

## 2021-08-18 ENCOUNTER — Ambulatory Visit (INDEPENDENT_AMBULATORY_CARE_PROVIDER_SITE_OTHER): Payer: 59 | Admitting: Family Medicine

## 2021-08-18 VITALS — BP 110/74 | HR 57 | Temp 97.7°F | Ht 65.0 in | Wt 210.0 lb

## 2021-08-18 DIAGNOSIS — Z6835 Body mass index (BMI) 35.0-35.9, adult: Secondary | ICD-10-CM | POA: Diagnosis not present

## 2021-08-18 DIAGNOSIS — E65 Localized adiposity: Secondary | ICD-10-CM | POA: Diagnosis not present

## 2021-08-18 DIAGNOSIS — F3289 Other specified depressive episodes: Secondary | ICD-10-CM | POA: Diagnosis not present

## 2021-08-18 DIAGNOSIS — E669 Obesity, unspecified: Secondary | ICD-10-CM

## 2021-08-18 DIAGNOSIS — R7303 Prediabetes: Secondary | ICD-10-CM | POA: Diagnosis not present

## 2021-08-18 DIAGNOSIS — Z6838 Body mass index (BMI) 38.0-38.9, adult: Secondary | ICD-10-CM

## 2021-08-18 MED ORDER — TIRZEPATIDE 5 MG/0.5ML ~~LOC~~ SOAJ
5.0000 mg | SUBCUTANEOUS | 0 refills | Status: DC
  Filled 2021-08-18 – 2021-08-19 (×2): qty 2, 28d supply, fill #0

## 2021-08-19 ENCOUNTER — Other Ambulatory Visit (HOSPITAL_COMMUNITY): Payer: Self-pay

## 2021-08-19 MED ORDER — TIRZEPATIDE 5 MG/0.5ML ~~LOC~~ SOAJ
5.0000 mg | SUBCUTANEOUS | 3 refills | Status: DC
  Filled 2021-08-19: qty 2, 28d supply, fill #0

## 2021-08-19 NOTE — H&P (Signed)
TOTAL KNEE ADMISSION H&P  Patient is being admitted for right total knee arthroplasty.  Subjective:  Chief Complaint: Right knee pain.  HPI: Melissa [name], 71 y.o. female has a history of pain and functional disability in the right knee due to arthritis and has failed non-surgical conservative treatments for greater than 12 weeks to include NSAID's and/or analgesics, flexibility and strengthening excercises, and activity modification. Onset of symptoms was gradual, starting  several  years ago with gradually worsening course since that time. The patient noted no past surgery on the right knee.  Patient currently rates pain in the right knee at 7 out of 10 with activity. Patient has worsening of pain with activity and weight bearing, pain that interferes with activities of daily living, pain with passive range of motion, and crepitus. Patient has evidence of periarticular osteophytes and joint space narrowing by imaging studies. There is no active infection.  Patient Active Problem List   Diagnosis Date Noted   Coronary artery calcification 04/10/2020   Pure hypercholesterolemia 04/10/2020   Obesity (BMI 30-39.9) 04/10/2020   OA (osteoarthritis) of knee 94/85/4627   Diastolic dysfunction without heart failure 09/01/2015   Shortness of breath 06/26/2015   Hx of radiation therapy    Arthritis    Breast cancer, left breast ([place]) 01/07/2012   Recurrent breast cancer ([place]) 01/04/2012   Chronic UTI (urinary tract infection) 11/09/2011   Breast cancer ([place]) 07/23/1997    Past Medical History:  Diagnosis Date   Anemia    AR (aortic regurgitation) 06/2015   Trivial, Noted on ECHO   Breast cancer ([place]) 07/23/97   left, tx w/xrt, Tamoxifen x 5 yrs   Chronic pain    left knee   Constipation    Coronary artery calcification 04/10/2020   Mild proximal LAD seen on coronary CT-A in 2018   DDD (degenerative disc disease), lumbar 03/50/0938   xray   Diastolic dysfunction without heart failure  09/01/2015   Grade 1 diastolic dysfunction on echo 06/2015   Diverticulitis    pt unaware   DVT complicating pregnancy    Right calf, surface   Foot pain, bilateral    Gallbladder problem    H/O blood clots    History of bladder infections    History of endometriosis    Hx of radiation therapy 08/22/97 - 10/03/97   left breast   Joint pain    Knee pain    MR (mitral regurgitation) 06/2015   Noted on ECHO   OA (osteoarthritis)    Bilateral Knees   Obesity    Obesity (BMI 30-39.9) 04/10/2020   Personal history of radiation therapy    Prediabetes    Pure hypercholesterolemia 04/10/2020   Recurrent breast cancer ([place]) 01/04/12   biopsy, ER/PR+, Her 2 -   Shortness of breath 06/26/2015, 09/07/2016   TR (tricuspid regurgitation) 06/2015   Noted on ECHO   Vitamin D deficiency     Past Surgical History:  Procedure Laterality Date   ABDOMINAL HYSTERECTOMY  1987   endometriosos   BREAST BIOPSY Left 01/04/2012   BREAST LUMPECTOMY  02/14/2012   LUMPECTOMY;  Surgeon: Haywood Lasso, MD;  Location: [facility];  Service: General;  Laterality: Left;   BREAST SURGERY  1999   lumpectomy-left   CESAREAN SECTION     x 3   CHOLECYSTECTOMY  1993   COLONOSCOPY     HERNIA REPAIR  1829   umbilical    ovary removed  [place]  Left 07/10/2018   Procedure: LEFT TOTAL KNEE ARTHROPLASTY;  Surgeon: Gaynelle Arabian, MD;  Location: [facility];  Service: Orthopedics;  Laterality: Left;  96min    Prior to Admission medications   Medication Sig Start Date End Date Taking? Authorizing Provider  cephALEXin (KEFLEX) 250 MG capsule TAKE 1 CAPSULE BY MOUTH 3 TIMES A WEEK 07/27/21  Yes   cholecalciferol (VITAMIN D3) 25 MCG (1000 UT) tablet Take 1,000 Units by mouth daily.   Yes [provider]  estradiol (ESTRACE) 0.1 MG/GM vaginal cream Apply 1/2 gram (pea-sized amount) vaginally once a week Patient taking differently: 2 (two) times a week. 1/2 gram 01/15/21  Yes   letrozole (FEMARA) 2.5  MG tablet TAKE 1 TABLET BY MOUTH ONCE A DAY 03/23/21 03/23/22 Yes Ladell Pier, MD  rosuvastatin (CRESTOR) 10 MG tablet Take 1 tablet by mouth daily 07/17/20  Yes Skeet Latch, MD  tirzepatide [facility]) 5 MG/0.5ML Pen Inject 5 mg into the skin once a week. 08/18/21   Briscoe Deutscher, DO    No Known Allergies  Social History   Socioeconomic History   Marital status: Married    Spouse name: Not on file   Number of children: Not on file   Years of education: Not on file   Highest education level: Not on file  Occupational History   Occupation: Stay at home spouse  Tobacco Use   Smoking status: Never   Smokeless tobacco: Never  Vaping Use   Vaping Use: Never used  Substance and Sexual Activity   Alcohol use: No   Drug use: No   Sexual activity: Not on file    Comment: G3, P3  Other Topics Concern   Not on file  Social History Narrative   Not on file   Social Determinants of Health   Financial Resource Strain: Not on file  Food Insecurity: Not on file  Transportation Needs: Not on file  Physical Activity: Not on file  Stress: Not on file  Social Connections: Not on file  Intimate Partner Violence: Not on file    Tobacco Use: Low Risk    Smoking Tobacco Use: Never   Smokeless Tobacco Use: Never   Passive Exposure: Not on file   Social History   Substance and Sexual Activity  Alcohol Use No    Family History  Problem Relation Age of Onset   Cancer Mother 73       colon   Hypertension Mother    Obesity Mother    Cancer Maternal Aunt        colon   Breast cancer Maternal Aunt    Cancer Maternal Grandfather        prostate   Cancer Maternal Aunt        colon   Prostate cancer Maternal Uncle    Heart attack Father    Heart disease Father    Sudden death Father    Alcohol abuse Father    Heart attack Brother    Heart attack Paternal Grandfather    Heart failure Sister    Cancer Paternal Aunt        breast    ROS: Constitutional: no fever, no  chills, no night sweats, no significant weight loss Cardiovascular: no chest pain, no palpitations Respiratory: no cough, no shortness of breath, No COPD Gastrointestinal: no vomiting, no nausea Musculoskeletal: no swelling in Joints, Joint Pain Neurologic: no numbness, no tingling, no difficulty with balance   Objective:  Physical Exam: Well nourished and well developed.  General: Alert and oriented x3, cooperative and pleasant, no acute distress.  Head: normocephalic, atraumatic, neck supple.  Eyes: EOMI.  Respiratory: breath sounds clear in all fields, no wheezing, rales, or rhonchi. Cardiovascular: Regular rate and rhythm, no murmurs, gallops or rubs.  Abdomen: non-tender to palpation and soft, normoactive bowel sounds. Musculoskeletal:  Right Knee Exam:   Varus deformity.   No effusion present. No swelling present.   The range of motion is: 5 to 120 degrees.   Moderate crepitus on range of motion of the knee.   Positive medial greater than lateral joint line tenderness.   The knee is stable.     Left Knee Exam:   Looks great.   No effusion present. No swelling present.   The Range of motion is: 0 to 125 degrees.   No crepitus on range of motion of the knee.   No medial joint line tenderness.   No lateral joint line tenderness.   The knee is stable.  Calves soft and nontender. Motor function intact in LE. Strength 5/5 LE bilaterally. Neuro: Distal pulses 2+. Sensation to light touch intact in LE.   Vital signs in last 24 hours:    Imaging Review  Radiographs- AP and lateral of the bilateral knees dated 03/19/2021 show the prosthesis in excellent position with no periprosthetic abnormalities. On the right, there is bone-on-bone medial and patellofemoral arthritis with large osteophyte formation.  Assessment/Plan:  End stage arthritis, right knee   The patient history, physical examination, clinical judgment of the provider and imaging studies are consistent with  end stage degenerative joint disease of the right knee and total knee arthroplasty is deemed medically necessary. The treatment options including medical management, injection therapy arthroscopy and arthroplasty were discussed at length. The risks and benefits of total knee arthroplasty were presented and reviewed. The risks due to aseptic loosening, infection, stiffness, patella tracking problems, thromboembolic complications and other imponderables were discussed. The patient acknowledged the explanation, agreed to proceed with the plan and consent was signed. Patient is being admitted for inpatient treatment for surgery, pain control, PT, OT, prophylactic antibiotics, VTE prophylaxis, progressive ambulation and ADLs and discharge planning. The patient is planning to be discharged  home .   Patient's anticipated LOS is less than 2 midnights, meeting these requirements: - Younger than 3 - Lives within 1 hour of care - Has a competent adult at home to recover with post-op recover - NO history of  - Chronic pain requiring opiods  - Diabetes  - Coronary Artery Disease  - Heart failure  - Heart attack  - Stroke  - DVT/VTE  - Cardiac arrhythmia  - Respiratory Failure/COPD  - Renal failure  - Anemia  - Advanced Liver disease    Therapy Plans: [facility]   Disposition: Home with Husband    Planned DVT Prophylaxis: Xarelto 10mg  (Hx of Cancer and Hx of DVT)    DME Needed: None    PCP: Josetta Huddle, MD (clearance received)    Cardiologist: Clearance received    TXA: Topical    Allergies: None    Anesthesia Concerns: None    BMI: 36.2    Last HgbA1c: N/A  Pharmacy: [facility] - [address]  - Patient was instructed on what medications to stop prior to surgery. - Follow-up visit in 2 weeks with Dr. Wynelle Link - Begin physical therapy following surgery - Pre-operative lab work as pre-surgical testing - Prescriptions will be provided in hospital at time of  discharge  Fenton Foy, MBA, PA-C Orthopedic Surgery [facility] [place]

## 2021-08-20 ENCOUNTER — Other Ambulatory Visit: Payer: Self-pay

## 2021-08-20 ENCOUNTER — Encounter (HOSPITAL_COMMUNITY)
Admission: RE | Admit: 2021-08-20 | Discharge: 2021-08-20 | Disposition: A | Discharge status: Home / Self Care | Payer: 59 | Source: Ambulatory Visit | Attending: Orthopedic Surgery | Admitting: Orthopedic Surgery

## 2021-08-20 DIAGNOSIS — Z01812 Encounter for preprocedural laboratory examination: Secondary | ICD-10-CM | POA: Diagnosis not present

## 2021-08-20 DIAGNOSIS — Z20822 Contact with and (suspected) exposure to covid-19: Secondary | ICD-10-CM | POA: Diagnosis not present

## 2021-08-20 LAB — SARS CORONAVIRUS 2 (TAT 6-24 HRS): SARS Coronavirus 2: NEGATIVE

## 2021-08-20 NOTE — Progress Notes (Signed)
Chief Complaint:   OBESITY Melissa [name] is here to discuss her progress with her obesity treatment plan along with follow-up of her obesity related diagnoses. See Medical Weight Management Flowsheet for complete bioelectrical impedance results.  Today's visit was #: 10 Starting weight: 229 lbs Starting date: 12/09/2020 Weight change since last visit: 2 lbs Total lbs lost to date: 19 lbs Total weight loss percentage to date: -8.30%  Nutrition Plan: Keeping a food journal and adhering to recommended goals of 1200 calories and 95 grams of protein daily for 40% of the time. Activity: Cardio/strength training for 60 minutes 4 times per week.  Anti-obesity medications: Mounjaro 5 mg subcutaneously weekly. Reported side effects: None.  Interim History: [name] just restarted Mounjaro 5 mg.  She endorses cravings.  She says they worsened with the 2.5 mg dose.  Assessment/Plan:   1. Prediabetes, with polyphagia Not at goal. Goal is HgbA1c < 5.7.  Medication: Mounjaro 5 mg subcutaneously weekly.    Plan:  Continue Mounjaro 5 mg subcutaneously weekly, as per below. She will continue to focus on protein-rich, low simple carbohydrate foods. We reviewed the importance of hydration, regular exercise for stress reduction, and restorative sleep.   Lab Results  Component Value Date   HGBA1C 5.8 (H) 08/11/2021   Lab Results  Component Value Date   INSULIN 18.7 12/09/2020   - Refill tirzepatide (MOUNJARO) 5 MG/0.5ML Pen; Inject 5 mg into the skin once a week.  Dispense: 2 mL; Refill: 3  2. Visceral obesity Current visceral fat rating: 14. Visceral fat rating goal is < 13. Visceral adipose tissue is a hormonally active component of total body fat. This body composition phenotype is associated with medical disorders such as metabolic syndrome, cardiovascular disease, and several malignancies including prostate, breast, and colorectal cancers. Starting goal: Lose 7-10% of starting weight.   3.  Emotional eating tendencies Improving, but not optimized. Medication: None.    Plan: Discussed cues and consequences, how thoughts affect eating, model of thoughts, feelings, and behaviors, and strategies for change by focusing on the cue. Discussed cognitive distortions, coping thoughts, and how to change your thoughts.  4. Obesity, current BMI 35  Course: [name] is currently in the action stage of change. As such, her goal is to continue with weight loss efforts.   Nutrition goals: She has agreed to keeping a food journal and adhering to recommended goals of 1200 calories and 95 grams of protein.   Exercise goals:  As is.  Behavioral modification strategies: increasing lean protein intake, decreasing simple carbohydrates, and increasing vegetables.  [name] has agreed to follow-up with our clinic in 4 weeks. She was informed of the importance of frequent follow-up visits to maximize her success with intensive lifestyle modifications for her multiple health conditions.   Objective:   Blood pressure 110/74, pulse (!) 57, temperature 97.7 F (36.5 C), temperature source Oral, height 5\' 5"  (1.651 m), weight 210 lb (95.3 kg), SpO2 97 %. Body mass index is 34.95 kg/m.  General: Cooperative, alert, well developed, in no acute distress. HEENT: Conjunctivae and lids unremarkable. Cardiovascular: Regular rhythm.  Lungs: Normal work of breathing. Neurologic: No focal deficits.   Lab Results  Component Value Date   CREATININE 1.00 08/11/2021   BUN 14 08/11/2021   NA 137 08/11/2021   K 4.4 08/11/2021   CL 106 08/11/2021   CO2 26 08/11/2021   Lab Results  Component Value Date   ALT 20 08/11/2021   AST 22 08/11/2021   ALKPHOS  82 08/11/2021   BILITOT 1.0 08/11/2021   Lab Results  Component Value Date   HGBA1C 5.8 (H) 08/11/2021   HGBA1C 6.5 (H) 12/09/2020   Lab Results  Component Value Date   INSULIN 18.7 12/09/2020   Lab Results  Component Value Date   TSH 1.340 12/09/2020    Lab Results  Component Value Date   CHOL 155 12/09/2020   HDL 68 12/09/2020   LDLCALC 75 12/09/2020   TRIG 59 12/09/2020   CHOLHDL 2.3 12/09/2020   Lab Results  Component Value Date   VD25OH 47.3 12/09/2020   Lab Results  Component Value Date   WBC 4.9 08/11/2021   HGB 14.1 08/11/2021   HCT 44.2 08/11/2021   MCV 85.8 08/11/2021   PLT 228 08/11/2021   Lab Results  Component Value Date   IRON 102 12/09/2020   TIBC 362 12/09/2020   FERRITIN 235 (H) 12/09/2020   Attestation Statements:   Reviewed by clinician on day of visit: allergies, medications, problem list, medical history, surgical history, family history, social history, and previous encounter notes.  I, Water quality scientist, CMA, am acting as transcriptionist for Briscoe Deutscher, DO  I have reviewed the above documentation for accuracy and completeness, and I agree with the above. -  Briscoe Deutscher, DO, MS, FAAFP, DABOM - Family and Bariatric Medicine.

## 2021-08-23 NOTE — Anesthesia Preprocedure Evaluation (Addendum)
Anesthesia Evaluation  Patient identified by MRN, date of birth, ID band Patient awake    Reviewed: Allergy & Precautions, H&P , NPO status , Patient's Chart, lab work & pertinent test results  Airway Mallampati: II  TM Distance: >3 FB Neck ROM: Full    Dental no notable dental hx. (+) Teeth Intact, Dental Advisory Given   Pulmonary neg pulmonary ROS,    Pulmonary exam normal breath sounds clear to auscultation       Cardiovascular Exercise Tolerance: Good negative cardio ROS   Rhythm:Regular Rate:Normal     Neuro/Psych negative neurological ROS  negative psych ROS   GI/Hepatic negative GI ROS, Neg liver ROS,   Endo/Other  negative endocrine ROS  Renal/GU negative Renal ROS  negative genitourinary   Musculoskeletal  (+) Arthritis , Osteoarthritis,    Abdominal   Peds  Hematology  (+) Blood dyscrasia, anemia ,   Anesthesia Other Findings   Reproductive/Obstetrics negative OB ROS                            Anesthesia Physical Anesthesia Plan  ASA: 2  Anesthesia Plan: Spinal   Post-op Pain Management: Regional block and Ofirmev IV (intra-op)   Induction: Intravenous  PONV Risk Score and Plan: 3 and Propofol infusion, Ondansetron and Dexamethasone  Airway Management Planned: Simple Face Mask and Natural Airway  Additional Equipment:   Intra-op Plan:   Post-operative Plan:   Informed Consent: I have reviewed the patients History and Physical, chart, labs and discussed the procedure including the risks, benefits and alternatives for the proposed anesthesia with the patient or authorized representative who has indicated his/her understanding and acceptance.     Dental advisory given  Plan Discussed with: CRNA  Anesthesia Plan Comments:        Anesthesia Quick Evaluation

## 2021-08-24 ENCOUNTER — Observation Stay (HOSPITAL_COMMUNITY)
Admission: RE | Admit: 2021-08-24 | Discharge: 2021-08-25 | Disposition: A | Discharge status: Home / Self Care | Payer: 59 | Attending: Orthopedic Surgery | Admitting: Orthopedic Surgery

## 2021-08-24 ENCOUNTER — Encounter (HOSPITAL_COMMUNITY)
Admission: RE | Disposition: A | Discharge status: Home / Self Care | Payer: Self-pay | Source: Home / Self Care | Attending: Orthopedic Surgery

## 2021-08-24 ENCOUNTER — Ambulatory Visit (HOSPITAL_COMMUNITY): Payer: 59 | Admitting: Physician Assistant

## 2021-08-24 ENCOUNTER — Ambulatory Visit (HOSPITAL_COMMUNITY): Payer: 59 | Admitting: Anesthesiology

## 2021-08-24 ENCOUNTER — Encounter (HOSPITAL_COMMUNITY): Payer: Self-pay | Admitting: Orthopedic Surgery

## 2021-08-24 ENCOUNTER — Other Ambulatory Visit: Payer: Self-pay

## 2021-08-24 DIAGNOSIS — Z853 Personal history of malignant neoplasm of breast: Secondary | ICD-10-CM | POA: Insufficient documentation

## 2021-08-24 DIAGNOSIS — M1711 Unilateral primary osteoarthritis, right knee: Principal | ICD-10-CM | POA: Diagnosis present

## 2021-08-24 DIAGNOSIS — Z96652 Presence of left artificial knee joint: Secondary | ICD-10-CM | POA: Diagnosis not present

## 2021-08-24 DIAGNOSIS — G8918 Other acute postprocedural pain: Secondary | ICD-10-CM | POA: Diagnosis not present

## 2021-08-24 HISTORY — PX: TOTAL KNEE ARTHROPLASTY: SHX125

## 2021-08-24 LAB — GLUCOSE, CAPILLARY: Glucose-Capillary: 121 mg/dL — ABNORMAL HIGH (ref 70–99)

## 2021-08-24 SURGERY — ARTHROPLASTY, KNEE, TOTAL
Anesthesia: Spinal | Site: Knee | Laterality: Right

## 2021-08-24 MED ORDER — PHENYLEPHRINE 40 MCG/ML (10ML) SYRINGE FOR IV PUSH (FOR BLOOD PRESSURE SUPPORT)
PREFILLED_SYRINGE | INTRAVENOUS | Status: DC | PRN
  Administered 2021-08-24 (×3): 120 ug via INTRAVENOUS

## 2021-08-24 MED ORDER — PROPOFOL 10 MG/ML IV BOLUS
INTRAVENOUS | Status: DC | PRN
  Administered 2021-08-24: 20 mg via INTRAVENOUS
  Administered 2021-08-24: 30 mg via INTRAVENOUS

## 2021-08-24 MED ORDER — OXYCODONE HCL 5 MG PO TABS
5.0000 mg | ORAL_TABLET | ORAL | Status: DC | PRN
  Administered 2021-08-24 – 2021-08-25 (×5): 10 mg via ORAL
  Filled 2021-08-24 (×5): qty 2

## 2021-08-24 MED ORDER — LACTATED RINGERS IV SOLN: INTRAVENOUS | Status: DC

## 2021-08-24 MED ORDER — SODIUM CHLORIDE 0.9 % IR SOLN
Status: DC | PRN
  Administered 2021-08-24: 1000 mL

## 2021-08-24 MED ORDER — BUPIVACAINE LIPOSOME 1.3 % IJ SUSP
INTRAMUSCULAR | Status: DC | PRN
  Administered 2021-08-24: 20 mL

## 2021-08-24 MED ORDER — HYDROMORPHONE HCL 1 MG/ML IJ SOLN: 0.2500 mg | INTRAMUSCULAR | Status: DC | PRN

## 2021-08-24 MED ORDER — PROPOFOL 500 MG/50ML IV EMUL
INTRAVENOUS | Status: DC | PRN
  Administered 2021-08-24: 75 ug/kg/min via INTRAVENOUS

## 2021-08-24 MED ORDER — DOCUSATE SODIUM 100 MG PO CAPS
100.0000 mg | ORAL_CAPSULE | Freq: Two times a day (BID) | ORAL | Status: DC
  Administered 2021-08-24 – 2021-08-25 (×3): 100 mg via ORAL
  Filled 2021-08-24 (×3): qty 1

## 2021-08-24 MED ORDER — CEFAZOLIN SODIUM-DEXTROSE 2-4 GM/100ML-% IV SOLN
2.0000 g | INTRAVENOUS | Status: AC
  Administered 2021-08-24: 2 g via INTRAVENOUS
  Filled 2021-08-24: qty 100

## 2021-08-24 MED ORDER — PHENYLEPHRINE HCL (PRESSORS) 10 MG/ML IV SOLN
INTRAVENOUS | Status: AC
  Filled 2021-08-24: qty 1

## 2021-08-24 MED ORDER — METOCLOPRAMIDE HCL 5 MG/ML IJ SOLN: 5.0000 mg | Freq: Three times a day (TID) | INTRAMUSCULAR | Status: DC | PRN

## 2021-08-24 MED ORDER — ORAL CARE MOUTH RINSE: 15.0000 mL | Freq: Once | OROMUCOSAL | Status: AC

## 2021-08-24 MED ORDER — PROPOFOL 10 MG/ML IV BOLUS
INTRAVENOUS | Status: AC
  Filled 2021-08-24: qty 20

## 2021-08-24 MED ORDER — FENTANYL CITRATE (PF) 100 MCG/2ML IJ SOLN
INTRAMUSCULAR | Status: AC
  Filled 2021-08-24: qty 2

## 2021-08-24 MED ORDER — MIDAZOLAM HCL 2 MG/2ML IJ SOLN
INTRAMUSCULAR | Status: DC | PRN
  Administered 2021-08-24 (×2): 1 mg via INTRAVENOUS

## 2021-08-24 MED ORDER — BUPIVACAINE IN DEXTROSE 0.75-8.25 % IT SOLN
INTRATHECAL | Status: DC | PRN
  Administered 2021-08-24: 1.6 mL via INTRATHECAL

## 2021-08-24 MED ORDER — POLYETHYLENE GLYCOL 3350 17 G PO PACK: 17.0000 g | PACK | Freq: Every day | ORAL | Status: DC | PRN

## 2021-08-24 MED ORDER — FENTANYL CITRATE (PF) 100 MCG/2ML IJ SOLN
INTRAMUSCULAR | Status: DC | PRN
  Administered 2021-08-24 (×2): 50 ug via INTRAVENOUS

## 2021-08-24 MED ORDER — SODIUM CHLORIDE 0.9 % IR SOLN
Status: DC | PRN
Start: 2021-08-24 — End: 2021-08-24
  Administered 2021-08-24: 1000 mL

## 2021-08-24 MED ORDER — STERILE WATER FOR IRRIGATION IR SOLN
Status: DC | PRN
  Administered 2021-08-24: 2000 mL

## 2021-08-24 MED ORDER — CHLORHEXIDINE GLUCONATE 0.12 % MT SOLN
15.0000 mL | Freq: Once | OROMUCOSAL | Status: AC
  Administered 2021-08-24: 15 mL via OROMUCOSAL

## 2021-08-24 MED ORDER — SODIUM CHLORIDE (PF) 0.9 % IJ SOLN
INTRAMUSCULAR | Status: DC | PRN
  Administered 2021-08-24: 60 mL

## 2021-08-24 MED ORDER — DEXAMETHASONE SODIUM PHOSPHATE 10 MG/ML IJ SOLN
INTRAMUSCULAR | Status: AC
  Filled 2021-08-24: qty 1

## 2021-08-24 MED ORDER — DIPHENHYDRAMINE HCL 12.5 MG/5ML PO ELIX: 12.5000 mg | ORAL_SOLUTION | ORAL | Status: DC | PRN

## 2021-08-24 MED ORDER — CEFAZOLIN SODIUM-DEXTROSE 2-4 GM/100ML-% IV SOLN
2.0000 g | Freq: Four times a day (QID) | INTRAVENOUS | Status: AC
  Administered 2021-08-24 (×2): 2 g via INTRAVENOUS
  Filled 2021-08-24 (×2): qty 100

## 2021-08-24 MED ORDER — ONDANSETRON HCL 4 MG/2ML IJ SOLN
INTRAMUSCULAR | Status: AC
  Filled 2021-08-24: qty 2

## 2021-08-24 MED ORDER — DEXAMETHASONE SODIUM PHOSPHATE 10 MG/ML IJ SOLN
8.0000 mg | Freq: Once | INTRAMUSCULAR | Status: AC
  Administered 2021-08-24: 8 mg via INTRAVENOUS

## 2021-08-24 MED ORDER — MORPHINE SULFATE (PF) 2 MG/ML IV SOLN: 0.5000 mg | INTRAVENOUS | Status: DC | PRN

## 2021-08-24 MED ORDER — METHOCARBAMOL 500 MG IVPB - SIMPLE MED
500.0000 mg | Freq: Four times a day (QID) | INTRAVENOUS | Status: DC | PRN
  Filled 2021-08-24: qty 50

## 2021-08-24 MED ORDER — BUPIVACAINE-EPINEPHRINE (PF) 0.5% -1:200000 IJ SOLN
INTRAMUSCULAR | Status: DC | PRN
  Administered 2021-08-24: 20 mL via PERINEURAL

## 2021-08-24 MED ORDER — TRANEXAMIC ACID 1000 MG/10ML IV SOLN: 2000.0000 mg | Freq: Once | INTRAVENOUS | Status: DC

## 2021-08-24 MED ORDER — SODIUM CHLORIDE (PF) 0.9 % IJ SOLN
INTRAMUSCULAR | Status: AC
  Filled 2021-08-24: qty 10

## 2021-08-24 MED ORDER — FLEET ENEMA 7-19 GM/118ML RE ENEM: 1.0000 | ENEMA | Freq: Once | RECTAL | Status: DC | PRN

## 2021-08-24 MED ORDER — METHOCARBAMOL 500 MG PO TABS
500.0000 mg | ORAL_TABLET | Freq: Four times a day (QID) | ORAL | Status: DC | PRN
  Administered 2021-08-24 – 2021-08-25 (×2): 500 mg via ORAL
  Filled 2021-08-24 (×3): qty 1

## 2021-08-24 MED ORDER — BISACODYL 10 MG RE SUPP: 10.0000 mg | Freq: Every day | RECTAL | Status: DC | PRN

## 2021-08-24 MED ORDER — TRANEXAMIC ACID 1000 MG/10ML IV SOLN
2000.0000 mg | Freq: Once | INTRAVENOUS | Status: DC
  Filled 2021-08-24: qty 20

## 2021-08-24 MED ORDER — METOCLOPRAMIDE HCL 5 MG PO TABS: 5.0000 mg | ORAL_TABLET | Freq: Three times a day (TID) | ORAL | Status: DC | PRN

## 2021-08-24 MED ORDER — RIVAROXABAN 10 MG PO TABS
10.0000 mg | ORAL_TABLET | Freq: Every day | ORAL | Status: DC
  Administered 2021-08-25: 10 mg via ORAL
  Filled 2021-08-24: qty 1

## 2021-08-24 MED ORDER — MENTHOL 3 MG MT LOZG: 1.0000 | LOZENGE | OROMUCOSAL | Status: DC | PRN

## 2021-08-24 MED ORDER — BUPIVACAINE LIPOSOME 1.3 % IJ SUSP
INTRAMUSCULAR | Status: AC
  Filled 2021-08-24: qty 20

## 2021-08-24 MED ORDER — PROPOFOL 500 MG/50ML IV EMUL
INTRAVENOUS | Status: AC
  Filled 2021-08-24: qty 50

## 2021-08-24 MED ORDER — POVIDONE-IODINE 10 % EX SWAB
2.0000 "application " | Freq: Once | CUTANEOUS | Status: AC
  Administered 2021-08-24: 2 via TOPICAL

## 2021-08-24 MED ORDER — TRANEXAMIC ACID 1000 MG/10ML IV SOLN
INTRAVENOUS | Status: DC | PRN
  Administered 2021-08-24: 2000 mg via TOPICAL

## 2021-08-24 MED ORDER — MIDAZOLAM HCL 2 MG/2ML IJ SOLN
INTRAMUSCULAR | Status: AC
  Filled 2021-08-24: qty 2

## 2021-08-24 MED ORDER — DEXAMETHASONE SODIUM PHOSPHATE 10 MG/ML IJ SOLN
10.0000 mg | Freq: Once | INTRAMUSCULAR | Status: AC
  Administered 2021-08-25: 10 mg via INTRAVENOUS
  Filled 2021-08-24: qty 1

## 2021-08-24 MED ORDER — ROSUVASTATIN CALCIUM 10 MG PO TABS
10.0000 mg | ORAL_TABLET | Freq: Every day | ORAL | Status: DC
  Administered 2021-08-25: 10 mg via ORAL
  Filled 2021-08-24: qty 1

## 2021-08-24 MED ORDER — TRAMADOL HCL 50 MG PO TABS
50.0000 mg | ORAL_TABLET | Freq: Four times a day (QID) | ORAL | Status: DC | PRN
  Administered 2021-08-24 – 2021-08-25 (×3): 100 mg via ORAL
  Filled 2021-08-24 (×3): qty 2

## 2021-08-24 MED ORDER — PHENOL 1.4 % MT LIQD: 1.0000 | OROMUCOSAL | Status: DC | PRN

## 2021-08-24 MED ORDER — PHENYLEPHRINE HCL-NACL 20-0.9 MG/250ML-% IV SOLN
INTRAVENOUS | Status: DC | PRN
  Administered 2021-08-24: 75 ug/min via INTRAVENOUS

## 2021-08-24 MED ORDER — BUPIVACAINE LIPOSOME 1.3 % IJ SUSP: 20.0000 mL | Freq: Once | INTRAMUSCULAR | Status: DC

## 2021-08-24 MED ORDER — PHENYLEPHRINE 40 MCG/ML (10ML) SYRINGE FOR IV PUSH (FOR BLOOD PRESSURE SUPPORT)
PREFILLED_SYRINGE | INTRAVENOUS | Status: AC
  Filled 2021-08-24: qty 10

## 2021-08-24 MED ORDER — LIDOCAINE 2% (20 MG/ML) 5 ML SYRINGE
INTRAMUSCULAR | Status: DC | PRN
Start: 2021-08-24 — End: 2021-08-24
  Administered 2021-08-24: 60 mg via INTRAVENOUS

## 2021-08-24 MED ORDER — ONDANSETRON HCL 4 MG/2ML IJ SOLN
INTRAMUSCULAR | Status: DC | PRN
  Administered 2021-08-24: 4 mg via INTRAVENOUS

## 2021-08-24 MED ORDER — GABAPENTIN 300 MG PO CAPS
300.0000 mg | ORAL_CAPSULE | Freq: Three times a day (TID) | ORAL | Status: DC
  Administered 2021-08-24 – 2021-08-25 (×4): 300 mg via ORAL
  Filled 2021-08-24 (×4): qty 1

## 2021-08-24 MED ORDER — ONDANSETRON HCL 4 MG/2ML IJ SOLN
4.0000 mg | Freq: Four times a day (QID) | INTRAMUSCULAR | Status: DC | PRN
  Administered 2021-08-24: 4 mg via INTRAVENOUS
  Filled 2021-08-24: qty 2

## 2021-08-24 MED ORDER — ACETAMINOPHEN 500 MG PO TABS
1000.0000 mg | ORAL_TABLET | Freq: Four times a day (QID) | ORAL | Status: AC
  Administered 2021-08-24 – 2021-08-25 (×3): 1000 mg via ORAL
  Filled 2021-08-24 (×3): qty 2

## 2021-08-24 MED ORDER — LETROZOLE 2.5 MG PO TABS
2.5000 mg | ORAL_TABLET | Freq: Every day | ORAL | Status: DC
  Administered 2021-08-25: 2.5 mg via ORAL
  Filled 2021-08-24: qty 1

## 2021-08-24 MED ORDER — ONDANSETRON HCL 4 MG PO TABS: 4.0000 mg | ORAL_TABLET | Freq: Four times a day (QID) | ORAL | Status: DC | PRN

## 2021-08-24 MED ORDER — ACETAMINOPHEN 10 MG/ML IV SOLN
1000.0000 mg | Freq: Four times a day (QID) | INTRAVENOUS | Status: DC
  Administered 2021-08-24: 1000 mg via INTRAVENOUS
  Filled 2021-08-24: qty 100

## 2021-08-24 MED ORDER — SODIUM CHLORIDE 0.9 % IV SOLN: INTRAVENOUS | Status: DC

## 2021-08-24 SURGICAL SUPPLY — 54 items
ATTUNE MED DOME PAT 38 KNEE (Knees) ×1 IMPLANT
ATTUNE PS FEM RT SZ 5 CEM KNEE (Femur) ×1 IMPLANT
BAG COUNTER SPONGE SURGICOUNT (BAG) IMPLANT
BAG ZIPLOCK 12X15 (MISCELLANEOUS) ×2 IMPLANT
BASE TIBIAL ROT PLAT SZ 5 KNEE (Knees) IMPLANT
BLADE SAG 18X100X1.27 (BLADE) ×2 IMPLANT
BLADE SAW SGTL 11.0X1.19X90.0M (BLADE) ×2 IMPLANT
BNDG ELASTIC 6X5.8 VLCR STR LF (GAUZE/BANDAGES/DRESSINGS) ×2 IMPLANT
BOWL SMART MIX CTS (DISPOSABLE) ×2 IMPLANT
CEMENT HV SMART SET (Cement) ×4 IMPLANT
COVER SURGICAL LIGHT HANDLE (MISCELLANEOUS) ×2 IMPLANT
CUFF TOURN SGL QUICK 34 (TOURNIQUET CUFF) ×2
CUFF TRNQT CYL 34X4.125X (TOURNIQUET CUFF) ×1 IMPLANT
DECANTER SPIKE VIAL GLASS SM (MISCELLANEOUS) ×2 IMPLANT
DRAPE INCISE IOBAN 66X45 STRL (DRAPES) ×2 IMPLANT
DRAPE U-SHAPE 47X51 STRL (DRAPES) ×2 IMPLANT
DRESSING AQUACEL AG SP 3.5X10 (GAUZE/BANDAGES/DRESSINGS) IMPLANT
DRSG AQUACEL AG ADV 3.5X10 (GAUZE/BANDAGES/DRESSINGS) ×2 IMPLANT
DRSG AQUACEL AG SP 3.5X10 (GAUZE/BANDAGES/DRESSINGS) ×2
DURAPREP 26ML APPLICATOR (WOUND CARE) ×2 IMPLANT
ELECT REM PT RETURN 15FT ADLT (MISCELLANEOUS) ×2 IMPLANT
GLOVE SRG 8 PF TXTR STRL LF DI (GLOVE) ×1 IMPLANT
GLOVE SURG ENC MOIS LTX SZ6.5 (GLOVE) ×2 IMPLANT
GLOVE SURG ENC MOIS LTX SZ8 (GLOVE) ×4 IMPLANT
GLOVE SURG UNDER POLY LF SZ7 (GLOVE) ×2 IMPLANT
GLOVE SURG UNDER POLY LF SZ8 (GLOVE) ×2
GLOVE SURG UNDER POLY LF SZ8.5 (GLOVE) ×2 IMPLANT
GOWN STRL REUS W/TWL LRG LVL3 (GOWN DISPOSABLE) ×4 IMPLANT
GOWN STRL REUS W/TWL XL LVL3 (GOWN DISPOSABLE) ×2 IMPLANT
HANDPIECE INTERPULSE COAX TIP (DISPOSABLE) ×2
HOLDER FOLEY CATH W/STRAP (MISCELLANEOUS) IMPLANT
IMMOBILIZER KNEE 20 (SOFTGOODS) ×2
IMMOBILIZER KNEE 20 THIGH 36 (SOFTGOODS) ×1 IMPLANT
INSERT TIB ATTUNE RP SZ5X16 (Insert) ×1 IMPLANT
KIT TURNOVER KIT A (KITS) IMPLANT
MANIFOLD NEPTUNE II (INSTRUMENTS) ×2 IMPLANT
NS IRRIG 1000ML POUR BTL (IV SOLUTION) ×2 IMPLANT
PACK TOTAL KNEE CUSTOM (KITS) ×2 IMPLANT
PADDING CAST COTTON 6X4 STRL (CAST SUPPLIES) ×3 IMPLANT
PROTECTOR NERVE ULNAR (MISCELLANEOUS) ×2 IMPLANT
SET HNDPC FAN SPRY TIP SCT (DISPOSABLE) ×1 IMPLANT
SPONGE T-LAP 18X18 ~~LOC~~+RFID (SPONGE) ×6 IMPLANT
SPONGE T-LAP 4X18 ~~LOC~~+RFID (SPONGE) IMPLANT
STRIP CLOSURE SKIN 1/2X4 (GAUZE/BANDAGES/DRESSINGS) ×4 IMPLANT
SUT MNCRL AB 4-0 PS2 18 (SUTURE) ×2 IMPLANT
SUT STRATAFIX 0 PDS 27 VIOLET (SUTURE) ×2
SUT VIC AB 2-0 CT1 27 (SUTURE) ×6
SUT VIC AB 2-0 CT1 TAPERPNT 27 (SUTURE) ×3 IMPLANT
SUTURE STRATFX 0 PDS 27 VIOLET (SUTURE) ×1 IMPLANT
TIBIAL BASE ROT PLAT SZ 5 KNEE (Knees) ×2 IMPLANT
TRAY FOLEY MTR SLVR 16FR STAT (SET/KITS/TRAYS/PACK) ×2 IMPLANT
TUBE SUCTION HIGH CAP CLEAR NV (SUCTIONS) ×2 IMPLANT
WATER STERILE IRR 1000ML POUR (IV SOLUTION) ×4 IMPLANT
WRAP KNEE MAXI GEL POST OP (GAUZE/BANDAGES/DRESSINGS) ×2 IMPLANT

## 2021-08-24 NOTE — Interval H&P Note (Signed)
History and Physical Interval Note:  08/24/2021 6:34 AM  Melissa [name]  has presented today for surgery, with the diagnosis of right knee osteoarthritis.  The various methods of treatment have been discussed with the patient and family. After consideration of risks, benefits and other options for treatment, the patient has consented to  Procedure(s): TOTAL KNEE ARTHROPLASTY (Right) as a surgical intervention.  The patient's history has been reviewed, patient examined, no change in status, stable for surgery.  I have reviewed the patient's chart and labs.  Questions were answered to the patient's satisfaction.     Pilar Plate Eliannah Hinde

## 2021-08-24 NOTE — Progress Notes (Signed)
Orthopedic Tech Progress Note Patient Details:  Melissa [name] 1950/12/09 [id number]  CPM Right Knee CPM Right Knee: On Right Knee Flexion (Degrees): 40 Right Knee Extension (Degrees): 10  Post Interventions Patient Tolerated: Well Instructions Provided: Care of device  Melissa [name] 08/24/2021, 8:50 AM

## 2021-08-24 NOTE — Transfer of Care (Signed)
Immediate Anesthesia Transfer of Care Note  Patient: Melissa [name]  Procedure(s) Performed: TOTAL KNEE ARTHROPLASTY (Right: Knee)  Patient Location: PACU  Anesthesia Type:Spinal and MAC combined with regional for post-op pain  Level of Consciousness: awake, alert  and patient cooperative  Airway & Oxygen Therapy: Patient Spontanous Breathing and Patient connected to face mask oxygen  Post-op Assessment: Report given to RN and Post -op Vital signs reviewed and stable  Post vital signs: Reviewed and stable  Last Vitals:  Vitals Value Taken Time  BP 96/69 08/24/21 0848  Temp    Pulse 77 08/24/21 0850  Resp 18 08/24/21 0850  SpO2 98 % 08/24/21 0850  Vitals shown include unvalidated device data.  Last Pain:  Vitals:   08/24/21 0533  PainSc: 0-No pain      Patients Stated Pain Goal: 4 (79/02/40 9735)  Complications: No notable events documented.

## 2021-08-24 NOTE — Op Note (Signed)
OPERATIVE REPORT-TOTAL KNEE ARTHROPLASTY   Pre-operative diagnosis- Osteoarthritis  Right knee(s)  Post-operative diagnosis- Osteoarthritis Right knee(s)  Procedure-  Right  Total Knee Arthroplasty  Surgeon- Dione Plover. Shirel Mallis, MD  Assistant- Theresa Duty, PA-C   Anesthesia-   Adductor canal block and spinal  EBL- 25 ml   Drains None  Tourniquet time- 34 minutes @ 237 mm Hg    Complications- None  Condition-PACU - hemodynamically stable.   Brief Clinical Note  Melissa [name] is a 71 y.o. year old female with end stage OA of her right knee with progressively worsening pain and dysfunction. She has constant pain, with activity and at rest and significant functional deficits with difficulties even with ADLs. She has had extensive non-op management including analgesics, injections of cortisone and viscosupplements, and home exercise program, but remains in significant pain with significant dysfunction.Radiographs show bone on bone arthritis medial and patellofemoral. She presents now for right Total Knee Arthroplasty.     Procedure in detail---   The patient is brought into the operating room and positioned supine on the operating table. After successful administration of  Adductor canal block and spinal,   a tourniquet is placed high on the  Right thigh(s) and the lower extremity is prepped and draped in the usual sterile fashion. Time out is performed by the operating team and then the  Right lower extremity is wrapped in Esmarch, knee flexed and the tourniquet inflated to 300 mmHg.       A midline incision is made with a ten blade through the subcutaneous tissue to the level of the extensor mechanism. A fresh blade is used to make a medial parapatellar arthrotomy. Soft tissue over the proximal medial tibia is subperiosteally elevated to the joint line with a knife and into the semimembranosus bursa with a Cobb elevator. Soft tissue over the proximal lateral tibia is elevated with  attention being paid to avoiding the patellar tendon on the tibial tubercle. The patella is everted, knee flexed 90 degrees and the ACL and PCL are removed. Findings are bone on bone medial and patellofemoral with massive global osteophytes        The drill is used to create a starting hole in the distal femur and the canal is thoroughly irrigated with sterile saline to remove the fatty contents. The 5 degree Right  valgus alignment guide is placed into the femoral canal and the distal femoral cutting block is pinned to remove 8 mm off the distal femur. I took less than usual due to significant pre-op hyperextension. Resection is made with an oscillating saw.      The tibia is subluxed forward and the menisci are removed. The extramedullary alignment guide is placed referencing proximally at the medial aspect of the tibial tubercle and distally along the second metatarsal axis and tibial crest. The block is pinned to remove 63mm off the more deficient medial  side. Resection is made with an oscillating saw. Size 5is the most appropriate size for the tibia and the proximal tibia is prepared with the modular drill and keel punch for that size.      The femoral sizing guide is placed and size 5 is most appropriate. Rotation is marked off the epicondylar axis and confirmed by creating a rectangular flexion gap at 90 degrees. The size 5 cutting block is pinned in this rotation and the anterior, posterior and chamfer cuts are made with the oscillating saw. The intercondylar block is then placed and that cut is made.  Trial size 5 tibial component, trial size 5 posterior stabilized femur and a 16  mm posterior stabilized rotating platform insert trial is placed. Full extension is achieved with excellent varus/valgus and anterior/posterior balance throughout full range of motion. The patella is everted and thickness measured to be 22  mm. Free hand resection is taken to 12 mm, a 38 template is placed, lug holes are  drilled, trial patella is placed, and it tracks normally. Osteophytes are removed off the posterior femur with the trial in place. All trials are removed and the cut bone surfaces prepared with pulsatile lavage. Cement is mixed and once ready for implantation, the size 5 tibial implant, size  5 posterior stabilized femoral component, and the size 38 patella are cemented in place and the patella is held with the clamp. The trial insert is placed and the knee held in full extension. The Exparel (20 ml mixed with 60 ml saline) is injected into the extensor mechanism, posterior capsule, medial and lateral gutters and subcutaneous tissues.  All extruded cement is removed and once the cement is hard the permanent 16 mm posterior stabilized rotating platform insert is placed into the tibial tray.      The wound is copiously irrigated with saline solution and the extensor mechanism closed with # 0 Stratofix suture. The tourniquet is released for a total tourniquet time of 34  minutes. Flexion against gravity is 140 degrees and the patella tracks normally. Subcutaneous tissue is closed with 2.0 vicryl and subcuticular with running 4.0 Monocryl. The incision is cleaned and dried and steri-strips and a bulky sterile dressing are applied. The limb is placed into a knee immobilizer and the patient is awakened and transported to recovery in stable condition.      Please note that a surgical assistant was a medical necessity for this procedure in order to perform it in a safe and expeditious manner. Surgical assistant was necessary to retract the ligaments and vital neurovascular structures to prevent injury to them and also necessary for proper positioning of the limb to allow for anatomic placement of the prosthesis.   Dione Plover Melissa Cudmore, MD    08/24/2021, 8:24 AM

## 2021-08-24 NOTE — Anesthesia Procedure Notes (Signed)
Procedure Name: MAC Date/Time: 08/24/2021 7:12 AM Performed by: Lollie Sails, CRNA Pre-anesthesia Checklist: Patient identified, Emergency Drugs available, Suction available, Patient being monitored and Timeout performed Oxygen Delivery Method: Simple face mask Placement Confirmation: positive ETCO2

## 2021-08-24 NOTE — Anesthesia Procedure Notes (Signed)
Spinal  Patient location during procedure: OR Start time: 08/24/2021 7:12 AM End time: 08/24/2021 7:20 AM Reason for block: surgical anesthesia Staffing Performed: anesthesiologist  Anesthesiologist: Roderic Palau, MD Preanesthetic Checklist Completed: patient identified, IV checked, risks and benefits discussed, surgical consent, monitors and equipment checked, pre-op evaluation and timeout performed Spinal Block Patient position: sitting Prep: DuraPrep Patient monitoring: cardiac monitor, continuous pulse ox and blood pressure Approach: right paramedian (Attempted midline and L paramedian.) Location: L3-4 Injection technique: single-shot Needle Needle type: Quincke  Needle gauge: 22 G Needle length: 9 cm Assessment Sensory level: T8 Events: CSF return Additional Notes Functioning IV was confirmed and monitors were applied. Sterile prep and drape, including hand hygiene and sterile gloves were used. The patient was positioned and the spine was prepped. The skin was anesthetized with lidocaine.  Free flow of clear CSF was obtained prior to injecting local anesthetic into the CSF.  The spinal needle aspirated freely following injection.  The needle was carefully withdrawn.  The patient tolerated the procedure well.

## 2021-08-24 NOTE — Plan of Care (Signed)
°  Problem: Activity: Goal: Range of joint motion will improve Outcome: Progressing   Problem: Clinical Measurements: Goal: Postoperative complications will be avoided or minimized Outcome: Progressing   Problem: Pain Management: Goal: Pain level will decrease with appropriate interventions Outcome: Progressing   

## 2021-08-24 NOTE — Plan of Care (Signed)
  Problem: Activity: Goal: Ability to avoid complications of mobility impairment will improve Outcome: Progressing Goal: Range of joint motion will improve Outcome: Progressing   Problem: Pain Management: Goal: Pain level will decrease with appropriate interventions Outcome: Progressing   

## 2021-08-24 NOTE — Discharge Instructions (Addendum)
 Frank Aluisio, MD Total Joint Specialist [facility] [address] [place], [place] [address] [phone]  TOTAL KNEE REPLACEMENT POSTOPERATIVE DIRECTIONS    Knee Rehabilitation, Guidelines Following Surgery  Results after knee surgery are often greatly improved when you follow the exercise, range of motion and muscle strengthening exercises prescribed by your doctor. Safety measures are also important to protect the knee from further injury. If any of these exercises cause you to have increased pain or swelling in your knee joint, decrease the amount until you are comfortable again and slowly increase them. If you have problems or questions, call your caregiver or physical therapist for advice.    BLOOD CLOT PREVENTION Take a 10 mg Xarelto once a day for three weeks following surgery. Then take an 81 mg Aspirin once a day for three weeks. Then discontinue Aspirin. You may resume your vitamins/supplements once you have discontinued the Xarelto. Do not take any NSAIDs (Advil, Aleve, Ibuprofen, Meloxicam, etc.) until you have discontinued the Xarelto.    HOME CARE INSTRUCTIONS  Remove items at home which could result in a fall. This includes throw rugs or furniture in walking pathways.  ICE to the affected knee as much as tolerated. Icing helps control swelling. If the swelling is well controlled you will be more comfortable and rehab easier. Continue to use ice on the knee for pain and swelling from surgery. You may notice swelling that will progress down to the foot and ankle. This is normal after surgery. Elevate the leg when you are not up walking on it.    Continue to use the breathing machine which will help keep your temperature down. It is common for your temperature to cycle up and down following surgery, especially at night when you are not up moving around and exerting yourself. The breathing machine keeps your lungs expanded and your temperature  down. Do not place pillow under the operative knee, focus on keeping the knee straight while resting  DIET You may resume your previous home diet once you are discharged from the hospital.  DRESSING / WOUND CARE / SHOWERING Keep your bulky bandage on for 2 days. On the third post-operative day you may remove the Ace bandage and gauze. There is a waterproof adhesive bandage on your skin which will stay in place until your first follow-up appointment. Once you remove this you will not need to place another bandage You may begin showering 3 days following surgery, but do not submerge the incision under water.  ACTIVITY For the first 5 days, the key is rest and control of pain and swelling Do your home exercises twice a day starting on post-operative day 3. On the days you go to physical therapy, just do the home exercises once that day. You should rest, ice and elevate the leg for 50 minutes out of every hour. Get up and walk/stretch for 10 minutes per hour. After 5 days you can increase your activity slowly as tolerated. Walk with your walker as instructed. Use the walker until you are comfortable transitioning to a cane. Walk with the cane in the opposite hand of the operative leg. You may discontinue the cane once you are comfortable and walking steadily. Avoid periods of inactivity such as sitting longer than an hour when not asleep. This helps prevent blood clots.  You may discontinue the knee immobilizer once you are able to perform a straight leg raise while lying down. You may resume a sexual relationship in one   month or when given the OK by your doctor.  You may return to work once you are cleared by your doctor.  Do not drive a car for 6 weeks or until released by your surgeon.  Do not drive while taking narcotics.  TED HOSE STOCKINGS Wear the elastic stockings on both legs for three weeks following surgery during the day. You may remove them at night for sleeping.  WEIGHT  BEARING Weight bearing as tolerated with assist device (walker, cane, etc) as directed, use it as long as suggested by your surgeon or therapist, typically at least 4-6 weeks.  POSTOPERATIVE CONSTIPATION PROTOCOL Constipation - defined medically as fewer than three stools per week and severe constipation as less than one stool per week.  One of the most common issues patients have following surgery is constipation.  Even if you have a regular bowel pattern at home, your normal regimen is likely to be disrupted due to multiple reasons following surgery.  Combination of anesthesia, postoperative narcotics, change in appetite and fluid intake all can affect your bowels.  In order to avoid complications following surgery, here are some recommendations in order to help you during your recovery period.  Colace (docusate) - Pick up an over-the-counter form of Colace or another stool softener and take twice a day as long as you are requiring postoperative pain medications.  Take with a full glass of water daily.  If you experience loose stools or diarrhea, hold the colace until you stool forms back up. If your symptoms do not get better within 1 week or if they get worse, check with your doctor. Dulcolax (bisacodyl) - Pick up over-the-counter and take as directed by the product packaging as needed to assist with the movement of your bowels.  Take with a full glass of water.  Use this product as needed if not relieved by Colace only.  MiraLax (polyethylene glycol) - Pick up over-the-counter to have on hand. MiraLax is a solution that will increase the amount of water in your bowels to assist with bowel movements.  Take as directed and can mix with a glass of water, juice, soda, coffee, or tea. Take if you go more than two days without a movement. Do not use MiraLax more than once per day. Call your doctor if you are still constipated or irregular after using this medication for 7 days in a row.  If you continue  to have problems with postoperative constipation, please contact the office for further assistance and recommendations.  If you experience "the worst abdominal pain ever" or develop nausea or vomiting, please contact the office immediatly for further recommendations for treatment.  ITCHING If you experience itching with your medications, try taking only a single pain pill, or even half a pain pill at a time.  You can also use Benadryl over the counter for itching or also to help with sleep.   MEDICATIONS See your medication summary on the "After Visit Summary" that the nursing staff will review with you prior to discharge.  You may have some home medications which will be placed on hold until you complete the course of blood thinner medication.  It is important for you to complete the blood thinner medication as prescribed by your surgeon.  Continue your approved medications as instructed at time of discharge.  PRECAUTIONS If you experience chest pain or shortness of breath - call 911 immediately for transfer to the hospital emergency department.  If you develop a fever greater that 101   F, purulent drainage from wound, increased redness or drainage from wound, foul odor from the wound/dressing, or calf pain - CONTACT YOUR SURGEON.                                                   FOLLOW-UP APPOINTMENTS Make sure you keep all of your appointments after your operation with your surgeon and caregivers. You should call the office at the above phone number and make an appointment for approximately two weeks after the date of your surgery or on the date instructed by your surgeon outlined in the "After Visit Summary".  RANGE OF MOTION AND STRENGTHENING EXERCISES  Rehabilitation of the knee is important following a knee injury or an operation. After just a few days of immobilization, the muscles of the thigh which control the knee become weakened and shrink (atrophy). Knee exercises are designed to build up  the tone and strength of the thigh muscles and to improve knee motion. Often times heat used for twenty to thirty minutes before working out will loosen up your tissues and help with improving the range of motion but do not use heat for the first two weeks following surgery. These exercises can be done on a training (exercise) mat, on the floor, on a table or on a bed. Use what ever works the best and is most comfortable for you Knee exercises include:  Leg Lifts - While your knee is still immobilized in a splint or cast, you can do straight leg raises. Lift the leg to 60 degrees, hold for 3 sec, and slowly lower the leg. Repeat 10-20 times 2-3 times daily. Perform this exercise against resistance later as your knee gets better.  Quad and Hamstring Sets - Tighten up the muscle on the front of the thigh (Quad) and hold for 5-10 sec. Repeat this 10-20 times hourly. Hamstring sets are done by pushing the foot backward against an object and holding for 5-10 sec. Repeat as with quad sets.  Leg Slides: Lying on your back, slowly slide your foot toward your buttocks, bending your knee up off the floor (only go as far as is comfortable). Then slowly slide your foot back down until your leg is flat on the floor again. Angel Wings: Lying on your back spread your legs to the side as far apart as you can without causing discomfort.  A rehabilitation program following serious knee injuries can speed recovery and prevent re-injury in the future due to weakened muscles. Contact your doctor or a physical therapist for more information on knee rehabilitation.   POST-OPERATIVE OPIOID TAPER INSTRUCTIONS: It is important to wean off of your opioid medication as soon as possible. If you do not need pain medication after your surgery it is ok to stop day one. Opioids include: Codeine, Hydrocodone(Norco, Vicodin), Oxycodone(Percocet, oxycontin) and hydromorphone amongst others.  Long term and even short term use of opiods can  cause: Increased pain response Dependence Constipation Depression Respiratory depression And more.  Withdrawal symptoms can include Flu like symptoms Nausea, vomiting And more Techniques to manage these symptoms Hydrate well Eat regular healthy meals Stay active Use relaxation techniques(deep breathing, meditating, yoga) Do Not substitute Alcohol to help with tapering If you have been on opioids for less than two weeks and do not have pain than it is ok to stop all together.    Plan to wean off of opioids This plan should start within one week post op of your joint replacement. Maintain the same interval or time between taking each dose and first decrease the dose.  Cut the total daily intake of opioids by one tablet each day Next start to increase the time between doses. The last dose that should be eliminated is the evening dose.   IF YOU ARE TRANSFERRED TO A SKILLED REHAB FACILITY If the patient is transferred to a skilled rehab facility following release from the hospital, a list of the current medications will be sent to the facility for the patient to continue.  When discharged from the skilled rehab facility, please have the facility set up the patient's Home Health Physical Therapy prior to being released. Also, the skilled facility will be responsible for providing the patient with their medications at time of release from the facility to include their pain medication, the muscle relaxants, and their blood thinner medication. If the patient is still at the rehab facility at time of the two week follow up appointment, the skilled rehab facility will also need to assist the patient in arranging follow up appointment in our office and any transportation needs.  MAKE SURE YOU:  Understand these instructions.  Get help right away if you are not doing well or get worse.   DENTAL ANTIBIOTICS:  In most cases prophylactic antibiotics for Dental procdeures after total joint surgery are  not necessary.  Exceptions are as follows:  1. History of prior total joint infection  2. Severely immunocompromised (Organ Transplant, cancer chemotherapy, Rheumatoid biologic meds such as Humera)  3. Poorly controlled diabetes (A1C &gt; 8.0, blood glucose over 200)  If you have one of these conditions, contact your surgeon for an antibiotic prescription, prior to your dental procedure.    Pick up stool softner and laxative for home use following surgery while on pain medications. Do not submerge incision under water. Please use good hand washing techniques while changing dressing each day. May shower starting three days after surgery. Please use a clean towel to pat the incision dry following showers. Continue to use ice for pain and swelling after surgery. Do not use any lotions or creams on the incision until instructed by your surgeon.      Information on my medicine - XARELTO (Rivaroxaban)  This medication education was reviewed with me or my healthcare representative as part of my discharge preparation.    Why was Xarelto prescribed for you? Xarelto was prescribed for you to reduce the risk of blood clots forming after orthopedic surgery. The medical term for these abnormal blood clots is venous thromboembolism (VTE).  What do you need to know about xarelto ? Take your Xarelto ONCE DAILY at the same time every day. You may take it either with or without food.  If you have difficulty swallowing the tablet whole, you may crush it and mix in applesauce just prior to taking your dose.  Take Xarelto exactly as prescribed by your doctor and DO NOT stop taking Xarelto without talking to the doctor who prescribed the medication.  Stopping without other VTE prevention medication to take the place of Xarelto may increase your risk of developing a clot.  After discharge, you should have regular check-up appointments with your healthcare provider that is prescribing your  Xarelto.    What do you do if you miss a dose? If you miss a dose, take it as soon as you remember on the same   day then continue your regularly scheduled once daily regimen the next day. Do not take two doses of Xarelto on the same day.   Important Safety Information A possible side effect of Xarelto is bleeding. You should call your healthcare provider right away if you experience any of the following: Bleeding from an injury or your nose that does not stop. Unusual colored urine (red or dark brown) or unusual colored stools (red or black). Unusual bruising for unknown reasons. A serious fall or if you hit your head (even if there is no bleeding).  Some medicines may interact with Xarelto and might increase your risk of bleeding while on Xarelto. To help avoid this, consult your healthcare provider or pharmacist prior to using any new prescription or non-prescription medications, including herbals, vitamins, non-steroidal anti-inflammatory drugs (NSAIDs) and supplements.  This website has more information on Xarelto: www.xarelto.com.   

## 2021-08-24 NOTE — Evaluation (Signed)
Physical Therapy Evaluation Patient Details Name: Melissa [name] MRN: [id number] DOB: 1950-08-28 Today's Date: 08/24/2021  History of Present Illness  71 yo female s/p R TKA on 08/24/21. PMH: L TKA, breast CA, tricuspid reguritation, DDD, obesity, CAD  Clinical Impression  Pt is s/p TKA resulting in the deficits listed below (see PT Problem List).  Pt doing well, amb ~ 30' with RW and min assist. Anticipate steady progress.   Pt will benefit from skilled PT to increase their independence and safety with mobility to allow discharge to the venue listed below.         Recommendations for follow up therapy are one component of a multi-disciplinary discharge planning process, led by the attending physician.  Recommendations may be updated based on patient status, additional functional criteria and insurance authorization.  Follow Up Recommendations Follow physician's recommendations for discharge plan and follow up therapies    Assistance Recommended at Discharge Intermittent Supervision/Assistance  Patient can return home with the following  Help with stairs or ramp for entrance;Assist for transportation    Equipment Recommendations None recommended by PT  Recommendations for Other Services       Functional Status Assessment Patient has had a recent decline in their functional status and demonstrates the ability to make significant improvements in function in a reasonable and predictable amount of time.     Precautions / Restrictions Precautions Precautions: Knee;Fall Required Braces or Orthoses: Knee Immobilizer - Right Knee Immobilizer - Right: Discontinue once straight leg raise with < 10 degree lag Restrictions Weight Bearing Restrictions: No      Mobility  Bed Mobility Overal bed mobility: Needs Assistance Bed Mobility: Supine to Sit     Supine to sit: Min guard     General bed mobility comments: for safety    Transfers Overall transfer level: Needs  assistance Equipment used: Rolling walker (2 wheels) Transfers: Sit to/from Stand Sit to Stand: Min assist, Min guard, From elevated surface           General transfer comment: cues for hand placement and RLE position    Ambulation/Gait Ambulation/Gait assistance: Min assist Gait Distance (Feet): 45 Feet Assistive device: Rolling walker (2 wheels) Gait Pattern/deviations: Step-to pattern       General Gait Details: cues for sequence, RW position and posture  Stairs            Wheelchair Mobility    Modified Rankin (Stroke Patients Only)       Balance                                             Pertinent Vitals/Pain Pain Assessment Pain Assessment: 0-10 Pain Score: 4  Pain Location: right knee Pain Descriptors / Indicators: Aching, Grimacing, Sore Pain Intervention(s): Premedicated before session, Repositioned, Monitored during session, Limited activity within patient's tolerance, Ice applied    Home Living Family/patient expects to be discharged to:: Private residence Living Arrangements: Spouse/significant other Available Help at Discharge: Family;Available 24 hours/day Type of Home: House Home Access: Stairs to enter Entrance Stairs-Rails: Left Entrance Stairs-Number of Steps: 5   Home Layout: Two level;Able to live on main level with bedroom/bathroom Home Equipment: Rolling Walker (2 wheels);BSC/3in1;Shower seat;Crutches      Prior Function Prior Level of Function : Independent/Modified Independent  Hand Dominance        Extremity/Trunk Assessment   Upper Extremity Assessment Upper Extremity Assessment: Overall WFL for tasks assessed    Lower Extremity Assessment Lower Extremity Assessment: RLE deficits/detail RLE Deficits / Details: ankle WFL, knee and hip grossly 2+/5       Communication   Communication: No difficulties  Cognition Arousal/Alertness: Awake/alert Behavior During  Therapy: WFL for tasks assessed/performed Overall Cognitive Status: Within Functional Limits for tasks assessed                                          General Comments      Exercises     Assessment/Plan    PT Assessment Patient needs continued PT services  PT Problem List Decreased strength;Decreased range of motion;Decreased mobility;Decreased activity tolerance;Pain;Decreased knowledge of use of DME       PT Treatment Interventions DME instruction;Therapeutic exercise;Gait training;Stair training;Functional mobility training;Therapeutic activities;Patient/family education    PT Goals (Current goals can be found in the Care Plan section)  Acute Rehab PT Goals Patient Stated Goal: do more with less knee pain PT Goal Formulation: With patient Time For Goal Achievement: 08/31/21 Potential to Achieve Goals: Good    Frequency 7X/week     Co-evaluation               AM-PAC PT "6 Clicks" Mobility  Outcome Measure Help needed turning from your back to your side while in a flat bed without using bedrails?: A Little Help needed moving from lying on your back to sitting on the side of a flat bed without using bedrails?: A Little Help needed moving to and from a bed to a chair (including a wheelchair)?: A Little Help needed standing up from a chair using your arms (e.g., wheelchair or bedside chair)?: A Little Help needed to walk in hospital room?: A Little Help needed climbing 3-5 steps with a railing? : A Little 6 Click Score: 18    End of Session Equipment Utilized During Treatment: Gait belt Activity Tolerance: Patient tolerated treatment well Patient left: with call bell/phone within reach;in chair;with chair alarm set;with family/visitor present Nurse Communication: Mobility status PT Visit Diagnosis: Other abnormalities of gait and mobility (R26.89);Difficulty in walking, not elsewhere classified (R26.2)    Time: 2956-2130 PT Time Calculation  (min) (ACUTE ONLY): 18 min   Charges:   PT Evaluation $PT Eval Low Complexity: [place], PT  Acute Rehab Dept ([facility]) [phone] Pager [phone]  08/24/2021   [facility] 08/24/2021, 1:25 PM

## 2021-08-24 NOTE — Anesthesia Postprocedure Evaluation (Signed)
Anesthesia Post Note  Patient: ROSAURA BOLON  Procedure(s) Performed: TOTAL KNEE ARTHROPLASTY (Right: Knee)     Patient location during evaluation: PACU Anesthesia Type: Spinal and Regional Level of consciousness: oriented and awake and alert Pain management: pain level controlled Vital Signs Assessment: post-procedure vital signs reviewed and stable Respiratory status: spontaneous breathing, respiratory function stable and patient connected to nasal cannula oxygen Cardiovascular status: blood pressure returned to baseline and stable Postop Assessment: no headache, no backache, no apparent nausea or vomiting, spinal receding and patient able to bend at knees Anesthetic complications: no   No notable events documented.  Last Vitals:  Vitals:   08/24/21 0930 08/24/21 0945  BP: 109/78 110/78  Pulse: (!) 52 (!) 55  Resp: 14 16  Temp:  (!) 36.3 C  SpO2: 99% 100%    Last Pain:  Vitals:   08/24/21 0945  PainSc: 0-No pain                 Neena Beecham,W. EDMOND

## 2021-08-24 NOTE — Anesthesia Procedure Notes (Signed)
Anesthesia Regional Block: Adductor canal block   Pre-Anesthetic Checklist: , timeout performed,  Correct Patient, Correct Site, Correct Laterality,  Correct Procedure, Correct Position, site marked,  Risks and benefits discussed,  Pre-op evaluation,  At surgeon's request and post-op pain management  Laterality: Right  Prep: Maximum Sterile Barrier Precautions used, chloraprep       Needles:  Injection technique: Single-shot  Needle Type: Echogenic Stimulator Needle     Needle Length: 9cm  Needle Gauge: 21     Additional Needles:   Procedures:,,,, ultrasound used (permanent image in chart),,    Narrative:  Start time: 08/24/2021 6:47 AM End time: 08/24/2021 6:57 AM Injection made incrementally with aspirations every 5 mL.  Performed by: Personally  Anesthesiologist: Roderic Palau, MD

## 2021-08-25 ENCOUNTER — Encounter (HOSPITAL_COMMUNITY): Payer: Self-pay | Admitting: Orthopedic Surgery

## 2021-08-25 ENCOUNTER — Other Ambulatory Visit (HOSPITAL_COMMUNITY): Payer: Self-pay

## 2021-08-25 DIAGNOSIS — Z853 Personal history of malignant neoplasm of breast: Secondary | ICD-10-CM | POA: Diagnosis not present

## 2021-08-25 DIAGNOSIS — Z96652 Presence of left artificial knee joint: Secondary | ICD-10-CM | POA: Diagnosis not present

## 2021-08-25 DIAGNOSIS — M1711 Unilateral primary osteoarthritis, right knee: Secondary | ICD-10-CM | POA: Diagnosis not present

## 2021-08-25 LAB — BASIC METABOLIC PANEL
Anion gap: 7 (ref 5–15)
BUN: 12 mg/dL (ref 8–23)
CO2: 23 mmol/L (ref 22–32)
Calcium: 8.7 mg/dL — ABNORMAL LOW (ref 8.9–10.3)
Chloride: 104 mmol/L (ref 98–111)
Creatinine, Ser: 0.7 mg/dL (ref 0.44–1.00)
GFR, Estimated: >60 mL/min (ref >60)
Glucose, Bld: 129 mg/dL — ABNORMAL HIGH (ref 70–99)
Potassium: 4.2 mmol/L (ref 3.5–5.1)
Sodium: 134 mmol/L — ABNORMAL LOW (ref 135–145)

## 2021-08-25 LAB — CBC
HCT: 36.4 % (ref 36.0–46.0)
Hemoglobin: 11.6 g/dL — ABNORMAL LOW (ref 12.0–15.0)
MCH: 27.3 pg (ref 26.0–34.0)
MCHC: 31.9 g/dL (ref 30.0–36.0)
MCV: 85.6 fL (ref 80.0–100.0)
Platelets: 197 10*3/uL (ref 150–400)
RBC: 4.25 MIL/uL (ref 3.87–5.11)
RDW: 15.7 % — ABNORMAL HIGH (ref 11.5–15.5)
WBC: 8.6 10*3/uL (ref 4.0–10.5)
nRBC: 0 % (ref 0.0–0.2)

## 2021-08-25 MED ORDER — OXYCODONE HCL 5 MG PO TABS
5.0000 mg | ORAL_TABLET | Freq: Four times a day (QID) | ORAL | 0 refills | Status: DC | PRN
  Filled 2021-08-25: qty 42, 6d supply, fill #0

## 2021-08-25 MED ORDER — GABAPENTIN 300 MG PO CAPS
ORAL_CAPSULE | ORAL | 0 refills | Status: DC
  Filled 2021-08-25: qty 84, 42d supply, fill #0

## 2021-08-25 MED ORDER — RIVAROXABAN 10 MG PO TABS
10.0000 mg | ORAL_TABLET | Freq: Every day | ORAL | 0 refills | Status: AC
  Filled 2021-08-25: qty 20, 20d supply, fill #0

## 2021-08-25 MED ORDER — TRAMADOL HCL 50 MG PO TABS
50.0000 mg | ORAL_TABLET | Freq: Four times a day (QID) | ORAL | 0 refills | Status: DC | PRN
Start: 2021-08-25 — End: 2021-10-05
  Filled 2021-08-25: qty 40, 5d supply, fill #0

## 2021-08-25 MED ORDER — METHOCARBAMOL 500 MG PO TABS
500.0000 mg | ORAL_TABLET | Freq: Four times a day (QID) | ORAL | 0 refills | Status: DC | PRN
  Filled 2021-08-25: qty 40, 10d supply, fill #0

## 2021-08-25 NOTE — Progress Notes (Signed)
Subjective: 1 Day Post-Op Procedure(s) (LRB): TOTAL KNEE ARTHROPLASTY (Right) Patient reports pain as mild.   Patient seen in rounds by Dr. Wynelle Link. Patient is well, and has had no acute complaints or problems No issues overnight. Denies chest pain, SOB, or calf pain. Foley catheter removed this AM.  We will continue therapy today, ambulated 49' yesterday.   Objective: Vital signs in last 24 hours: Temp:  [97.3 F (36.3 C)-98.5 F (36.9 C)] 98.5 F (36.9 C) (01/31 0507) Pulse Rate:  [52-96] 69 (01/31 0507) Resp:  [14-25] 18 (01/31 0507) BP: (96-116)/(65-79) 106/74 (01/31 0507) SpO2:  [92 %-100 %] 94 % (01/31 0507)  Intake/Output from previous day:  Intake/Output Summary (Last 24 hours) at 08/25/2021 0718 Last data filed at 08/25/2021 0600 Gross per 24 hour  Intake 3026.44 ml  Output 2445 ml  Net 581.44 ml     Intake/Output this shift: No intake/output data recorded.  Labs: Recent Labs    08/25/21 0310  HGB 11.6*   Recent Labs    08/25/21 0310  WBC 8.6  RBC 4.25  HCT 36.4  PLT 197   Recent Labs    08/25/21 0310  NA 134*  K 4.2  CL 104  CO2 23  BUN 12  CREATININE 0.70  GLUCOSE 129*  CALCIUM 8.7*   No results for input(s): LABPT, INR in the last 72 hours.  Exam: General - Patient is Alert and Oriented Extremity - Neurologically intact Neurovascular intact Sensation intact distally Dorsiflexion/Plantar flexion intact Dressing - dressing C/D/I Motor Function - intact, moving foot and toes well on exam.   Past Medical History:  Diagnosis Date   Anemia    AR (aortic regurgitation) 06/2015   Trivial, Noted on ECHO   Breast cancer ([place]) 07/23/97   left, tx w/xrt, Tamoxifen x 5 yrs   Chronic pain    left knee   Constipation    Coronary artery calcification 04/10/2020   Mild proximal LAD seen on coronary CT-A in 2018   DDD (degenerative disc disease), lumbar 70/62/3762   xray   Diastolic dysfunction without heart failure 09/01/2015   Grade 1  diastolic dysfunction on echo 06/2015   Diverticulitis    pt unaware   DVT complicating pregnancy    Right calf, surface   Foot pain, bilateral    Gallbladder problem    H/O blood clots    History of bladder infections    History of endometriosis    Hx of radiation therapy 08/22/97 - 10/03/97   left breast   Joint pain    Knee pain    MR (mitral regurgitation) 06/2015   Noted on ECHO   OA (osteoarthritis)    Bilateral Knees   Obesity    Obesity (BMI 30-39.9) 04/10/2020   Personal history of radiation therapy    Prediabetes    Pure hypercholesterolemia 04/10/2020   Recurrent breast cancer ([place]) 01/04/12   biopsy, ER/PR+, Her 2 -   Shortness of breath 06/26/2015, 09/07/2016   TR (tricuspid regurgitation) 06/2015   Noted on ECHO   Vitamin D deficiency     Assessment/Plan: 1 Day Post-Op Procedure(s) (LRB): TOTAL KNEE ARTHROPLASTY (Right) Principal Problem:   Primary osteoarthritis of right knee  Estimated body mass index is 34.95 kg/m as calculated from the following:   Height as of this encounter: 5\' 5"  (1.651 m).   Weight as of this encounter: 95.3 kg. Advance diet Up with therapy D/C IV fluids   Patient's anticipated LOS is less than 2  midnights, meeting these requirements: - Lives within 1 hour of care - Has a competent adult at home to recover with post-op recover - NO history of  - Chronic pain requiring opioids  - Diabetes  - Coronary Artery Disease  - Heart failure  - Heart attack  - Stroke  - Cardiac arrhythmia  - Respiratory Failure/COPD  - Renal failure  - Anemia  - Advanced Liver disease  DVT Prophylaxis - Xarelto Weight bearing as tolerated. Continue therapy.  Plan is to go Home after hospital stay. Plan for discharge later today if progresses with therapy and meeting goals. Scheduled for OPPT at [facility]. Follow-up in the office in 2 weeks.  The PDMP database was reviewed today prior to any opioid medications being prescribed to this  patient.  Theresa Duty, PA-C Orthopedic Surgery [phone] 08/25/2021, 7:18 AM

## 2021-08-25 NOTE — Progress Notes (Signed)
Physical Therapy Treatment Patient Details Name: Melissa [name] MRN: [id number] DOB: 03-Sep-1950 Today's Date: 08/25/2021   History of Present Illness 71 yo female s/p R TKA on 08/24/21. PMH: L TKA, breast CA, tricuspid reguritation, DDD, obesity, CAD    PT Comments    Pt progressing well today. Reviewed gait, transfer safety, HEP and other areas noted below. Will see for second session and pt should be ready to d/c later today   Recommendations for follow up therapy are one component of a multi-disciplinary discharge planning process, led by the attending physician.  Recommendations may be updated based on patient status, additional functional criteria and insurance authorization.  Follow Up Recommendations  Follow physician's recommendations for discharge plan and follow up therapies     Assistance Recommended at Discharge Intermittent Supervision/Assistance  Patient can return home with the following Help with stairs or ramp for entrance;Assist for transportation   Equipment Recommendations  None recommended by PT    Recommendations for Other Services       Precautions / Restrictions Precautions Precautions: Knee;Fall Precaution Comments: IND SLRs today Required Braces or Orthoses: Knee Immobilizer - Right Knee Immobilizer - Right: Discontinue once straight leg raise with < 10 degree lag Restrictions Weight Bearing Restrictions: No Other Position/Activity Restrictions: WBAT     Mobility  Bed Mobility         Supine to sit: Supervision     General bed mobility comments: for safety    Transfers   Equipment used: Rolling walker (2 wheels) Transfers: Sit to/from Stand Sit to Stand: Supervision, Min guard           General transfer comment: cues for hand placement and RLE position    Ambulation/Gait Ambulation/Gait assistance: Min guard Gait Distance (Feet): 90 Feet Assistive device: Rolling walker (2 wheels) Gait Pattern/deviations: Step-to pattern        General Gait Details: cues for sequence, RW position and posture   Stairs             Wheelchair Mobility    Modified Rankin (Stroke Patients Only)       Balance                                            Cognition Arousal/Alertness: Awake/alert Behavior During Therapy: WFL for tasks assessed/performed Overall Cognitive Status: Within Functional Limits for tasks assessed                                          Exercises Total Joint Exercises Ankle Circles/Pumps: AROM, Both, 10 reps Quad Sets: AROM, Both, 10 reps Heel Slides: AAROM, Right, 10 reps Hip ABduction/ADduction: AROM, Strengthening, Right, 10 reps Straight Leg Raises: AROM, Right, 10 reps    General Comments        Pertinent Vitals/Pain Pain Assessment Pain Assessment: 0-10 Pain Score: 5  Pain Location: right knee Pain Descriptors / Indicators: Aching, Grimacing, Sore Pain Intervention(s): Limited activity within patient's tolerance, Monitored during session, Premedicated before session, Repositioned    Home Living                          Prior Function            PT Goals (current goals can now be found  in the care plan section) Acute Rehab PT Goals Patient Stated Goal: do more with less knee pain PT Goal Formulation: With patient Time For Goal Achievement: 08/31/21 Potential to Achieve Goals: Good Progress towards PT goals: Progressing toward goals    Frequency    7X/week      PT Plan Current plan remains appropriate    Co-evaluation              AM-PAC PT "6 Clicks" Mobility   Outcome Measure  Help needed turning from your back to your side while in a flat bed without using bedrails?: None Help needed moving from lying on your back to sitting on the side of a flat bed without using bedrails?: None Help needed moving to and from a bed to a chair (including a wheelchair)?: A Little Help needed standing up from a chair  using your arms (e.g., wheelchair or bedside chair)?: A Little Help needed to walk in hospital room?: A Little Help needed climbing 3-5 steps with a railing? : A Little 6 Click Score: 20    End of Session Equipment Utilized During Treatment: Gait belt Activity Tolerance: Patient tolerated treatment well Patient left: with call bell/phone within reach;in chair;with chair alarm set;with family/visitor present Nurse Communication: Mobility status PT Visit Diagnosis: Other abnormalities of gait and mobility (R26.89);Difficulty in walking, not elsewhere classified (R26.2)     Time: 6962-9528 PT Time Calculation (min) (ACUTE ONLY): 15 min  Charges:  $Gait Training: 8-22 mins                     Baxter Flattery, PT  Acute Rehab Dept ([place]) [phone] Pager [phone]  08/25/2021    [facility] 08/25/2021, 10:33 AM

## 2021-08-25 NOTE — TOC Transition Note (Signed)
Transition of Care [facility]) - CM/SW Discharge Note   Patient Details  Name: Melissa [name] MRN: [id number] Date of Birth: 11/04/1950  [facility] of [facility]) CM/SW Contact:  Lennart Pall, LCSW Phone Number: 08/25/2021, 10:57 AM   Clinical Narrative:    Met with pt today who confirms plan for OPPT @ [facility].  Pt needing 3n1 commode - no agency pref - order placed with [facility] for delivery to patient room.  No further TOC needs.   Final next level of care: OP Rehab Barriers to Discharge: No Barriers Identified   Patient Goals and CMS Choice Patient states their goals for this hospitalization and ongoing recovery are:: return home      Discharge Placement                       Discharge Plan and Services                DME Arranged: 3-N-1 DME Agency: [employer] Date DME Agency Contacted: 08/25/21 Time DME Agency Contacted: 2099 Representative spoke with at DME Agency: [place] ([place]) Interventions     Readmission Risk Interventions No flowsheet data found.

## 2021-08-25 NOTE — Progress Notes (Signed)
Discharge package printed and instructions given to patient and husband. Verbalize understanding.

## 2021-08-25 NOTE — Progress Notes (Signed)
Physical Therapy Treatment Patient Details Name: Melissa [name] MRN: [id number] DOB: March 01, 1951 Today's Date: 08/25/2021   History of Present Illness 71 yo female s/p R TKA on 08/24/21. PMH: L TKA, breast CA, tricuspid reguritation, DDD, obesity, CAD    PT Comments    Reviewed gait, stairs, transfer safety, use of gait belt and other areas noted below. Husband present for session. Pt is ready to d/c from PT standpoint with family assist as needed    Recommendations for follow up therapy are one component of a multi-disciplinary discharge planning process, led by the attending physician.  Recommendations may be updated based on patient status, additional functional criteria and insurance authorization.  Follow Up Recommendations  Follow physician's recommendations for discharge plan and follow up therapies     Assistance Recommended at Discharge Intermittent Supervision/Assistance  Patient can return home with the following Help with stairs or ramp for entrance;Assist for transportation alittle help with amb/transfers    Equipment Recommendations  None recommended by PT    Recommendations for Other Services       Precautions / Restrictions Precautions Precautions: Knee;Fall Precaution Comments: IND SLRs today Required Braces or Orthoses: Knee Immobilizer - Right Knee Immobilizer - Right: Discontinue once straight leg raise with < 10 degree lag Restrictions Weight Bearing Restrictions: No Other Position/Activity Restrictions: WBAT     Mobility  Bed Mobility         Supine to sit: Supervision     General bed mobility comments: for safety    Transfers   Equipment used: Rolling walker (2 wheels) Transfers: Sit to/from Stand Sit to Stand: Supervision, Min guard           General transfer comment: cues for hand placement and RLE position; husband able to return demo assist as needed    Ambulation/Gait Ambulation/Gait assistance: Min guard Gait Distance (Feet):  80 Feet Assistive device: Rolling walker (2 wheels) Gait Pattern/deviations: Step-to pattern       General Gait Details: cues for sequence, RW position and posture   Stairs Stairs: Yes Stairs assistance: Min assist Stair Management: One rail Left, Forwards, With crutches, Step to pattern Number of Stairs: 5 (x2) General stair comments: cues for sequence, crutch placement and safe technique. light assist to steady when wt shifting. husband present for stair training   Wheelchair Mobility    Modified Rankin (Stroke Patients Only)       Balance                                            Cognition Arousal/Alertness: Awake/alert Behavior During Therapy: WFL for tasks assessed/performed Overall Cognitive Status: Within Functional Limits for tasks assessed                                          Exercises Total Joint Exercises Ankle Circles/Pumps: AROM, Both, 10 reps Quad Sets: AROM, Both, 10 reps Heel Slides: AAROM, Right, 10 reps Hip ABduction/ADduction: AROM, Strengthening, Right, 10 reps Straight Leg Raises: AROM, Right, 10 reps    General Comments        Pertinent Vitals/Pain Pain Assessment Pain Assessment: 0-10 Pain Score: 4  Pain Location: right knee Pain Descriptors / Indicators: Aching, Grimacing, Sore Pain Intervention(s): Limited activity within patient's tolerance, Monitored during session, Premedicated before session,  Repositioned    Home Living                          Prior Function            PT Goals (current goals can now be found in the care plan section) Acute Rehab PT Goals Patient Stated Goal: do more with less knee pain PT Goal Formulation: With patient Time For Goal Achievement: 08/31/21 Potential to Achieve Goals: Good Progress towards PT goals: Progressing toward goals    Frequency    7X/week      PT Plan Current plan remains appropriate    Co-evaluation               AM-PAC PT "6 Clicks" Mobility   Outcome Measure  Help needed turning from your back to your side while in a flat bed without using bedrails?: None Help needed moving from lying on your back to sitting on the side of a flat bed without using bedrails?: None Help needed moving to and from a bed to a chair (including a wheelchair)?: A Little Help needed standing up from a chair using your arms (e.g., wheelchair or bedside chair)?: A Little Help needed to walk in hospital room?: A Little Help needed climbing 3-5 steps with a railing? : A Little 6 Click Score: 20    End of Session Equipment Utilized During Treatment: Gait belt Activity Tolerance: Patient tolerated treatment well Patient left: with call bell/phone within reach;in chair;with chair alarm set;with family/visitor present Nurse Communication: Mobility status PT Visit Diagnosis: Other abnormalities of gait and mobility (R26.89);Difficulty in walking, not elsewhere classified (R26.2)     Time: 1351-1406 PT Time Calculation (min) (ACUTE ONLY): 15 min  Charges:  $Gait Training: 8-22 mins                     Baxter Flattery, PT  Acute Rehab Dept ([place]) [phone] Pager [phone]  08/25/2021    [facility] 08/25/2021, 2:45 PM

## 2021-08-25 NOTE — Plan of Care (Signed)
°  Problem: Pain Management: Goal: Pain level will decrease with appropriate interventions Outcome: Progressing   Problem: Activity: Goal: Ability to avoid complications of mobility impairment will improve Outcome: Progressing Goal: Range of joint motion will improve Outcome: Progressing

## 2021-08-26 NOTE — Discharge Summary (Signed)
Patient ID: Melissa [name] MRN: [id number] DOB/AGE: [birth year] 70 y.o.  Admit date: 08/24/2021 Discharge date: 08/25/2021  Admission Diagnoses:  Principal Problem:   Primary osteoarthritis of right knee   Discharge Diagnoses:  Same  Past Medical History:  Diagnosis Date   Anemia    AR (aortic regurgitation) 06/2015   Trivial, Noted on ECHO   Breast cancer ([place]) 07/23/97   left, tx w/xrt, Tamoxifen x 5 yrs   Chronic pain    left knee   Constipation    Coronary artery calcification 04/10/2020   Mild proximal LAD seen on coronary CT-A in 2018   DDD (degenerative disc disease), lumbar 02/58/5277   xray   Diastolic dysfunction without heart failure 09/01/2015   Grade 1 diastolic dysfunction on echo 06/2015   Diverticulitis    pt unaware   DVT complicating pregnancy    Right calf, surface   Foot pain, bilateral    Gallbladder problem    H/O blood clots    History of bladder infections    History of endometriosis    Hx of radiation therapy 08/22/97 - 10/03/97   left breast   Joint pain    Knee pain    MR (mitral regurgitation) 06/2015   Noted on ECHO   OA (osteoarthritis)    Bilateral Knees   Obesity    Obesity (BMI 30-39.9) 04/10/2020   Personal history of radiation therapy    Prediabetes    Pure hypercholesterolemia 04/10/2020   Recurrent breast cancer ([place]) 01/04/12   biopsy, ER/PR+, Her 2 -   Shortness of breath 06/26/2015, 09/07/2016   TR (tricuspid regurgitation) 06/2015   Noted on ECHO   Vitamin D deficiency     Surgeries: Procedure(s): TOTAL KNEE ARTHROPLASTY on 08/24/2021   Consultants:   Discharged Condition: Improved  Hospital Course: Melissa [name] is an 71 y.o. female who was admitted 08/24/2021 for operative treatment ofPrimary osteoarthritis of right knee. Patient has severe unremitting pain that affects sleep, daily activities, and work/hobbies. After pre-op clearance the patient was taken to the operating room on 08/24/2021 and underwent   Procedure(s): TOTAL KNEE ARTHROPLASTY.    Patient was given perioperative antibiotics:  Anti-infectives (From admission, onward)    Start     Dose/Rate Route Frequency Ordered Stop   08/24/21 1400  ceFAZolin (ANCEF) IVPB 2g/100 mL premix        2 g 200 mL/hr over 30 Minutes Intravenous Every 6 hours 08/24/21 1014 08/24/21 2032   08/24/21 0600  ceFAZolin (ANCEF) IVPB 2g/100 mL premix        2 g 200 mL/hr over 30 Minutes Intravenous On call to O.R. 08/24/21 [id number] 08/24/21 0736        Patient was given sequential compression devices, early ambulation, and chemoprophylaxis to prevent DVT.  Patient benefited maximally from hospital stay and there were no complications.    Recent vital signs: Patient Vitals for the past 24 hrs:  BP Temp Temp src Pulse Resp SpO2  08/25/21 1346 113/75 98.1 F (36.7 C) Oral (!) 57 16 94 %     Recent laboratory studies:  Recent Labs    08/25/21 0310  WBC 8.6  HGB 11.6*  HCT 36.4  PLT 197  NA 134*  K 4.2  CL 104  CO2 23  BUN 12  CREATININE 0.70  GLUCOSE 129*  CALCIUM 8.7*     Discharge Medications:   Allergies as of 08/25/2021   No Known Allergies      Medication List  STOP taking these medications    cholecalciferol 25 MCG (1000 UNIT) tablet Commonly known as: VITAMIN D3       TAKE these medications    cephALEXin 250 MG capsule Commonly known as: KEFLEX TAKE 1 CAPSULE BY MOUTH 3 TIMES A WEEK   estradiol 0.1 MG/GM vaginal cream Commonly known as: ESTRACE Apply 1/2 gram (pea-sized amount) vaginally once a week What changed:  how to take this when to take this additional instructions   gabapentin 300 MG capsule Commonly known as: NEURONTIN Take 1 capsule three times a day for two weeks following surgery.Then take 1 capsule two times a day for two weeks. Then take 1 capsule once a day for two weeks. Then discontinue.   letrozole 2.5 MG tablet Commonly known as: FEMARA TAKE 1 TABLET BY MOUTH ONCE A DAY    methocarbamol 500 MG tablet Commonly known as: ROBAXIN Take 1 tablet (500 mg total) by mouth every 6 (six) hours as needed for muscle spasms.   Mounjaro 5 MG/0.5ML Pen Generic drug: tirzepatide Inject 5 mg into the skin once a week.   oxyCODONE 5 MG immediate release tablet Commonly known as: Oxy IR/ROXICODONE Take 1 - 2 tablets by mouth every 6 hours as needed for severe pain.   rosuvastatin 10 MG tablet Commonly known as: CRESTOR Take 1 tablet by mouth daily   traMADol 50 MG tablet Commonly known as: ULTRAM Take 1 -2 tablets by mouth every 6 hours as needed for moderate pain.   Xarelto 10 MG Tabs tablet Generic drug: rivaroxaban Take 1 tablet (10 mg total) by mouth daily with breakfast for 20 days. Then take one 81 mg aspirin once a day for three weeks. Then discontinue aspirin.               Discharge Care Instructions  (From admission, onward)           Start     Ordered   08/25/21 0000  Weight bearing as tolerated        08/25/21 0722   08/25/21 0000  Change dressing       Comments: You may remove the bulky bandage (ACE wrap and gauze) two days after surgery. You will have an adhesive waterproof bandage underneath. Leave this in place until your first follow-up appointment.   08/25/21 0722            Diagnostic Studies: No results found.  Disposition: Discharge disposition: 01-Home or Self Care       Discharge Instructions     Call MD / Call 911   Complete by: As directed    If you experience chest pain or shortness of breath, CALL 911 and be transported to the hospital emergency room.  If you develope a fever above 101 F, pus (white drainage) or increased drainage or redness at the wound, or calf pain, call your surgeon's office.   Change dressing   Complete by: As directed    You may remove the bulky bandage (ACE wrap and gauze) two days after surgery. You will have an adhesive waterproof bandage underneath. Leave this in place until your  first follow-up appointment.   Constipation Prevention   Complete by: As directed    Drink plenty of fluids.  Prune juice may be helpful.  You may use a stool softener, such as Colace (over the counter) 100 mg twice a day.  Use MiraLax (over the counter) for constipation as needed.   Diet - low sodium heart healthy  Complete by: As directed    Do not put a pillow under the knee. Place it under the heel.   Complete by: As directed    Driving restrictions   Complete by: As directed    No driving for two weeks   Post-operative opioid taper instructions:   Complete by: As directed    POST-OPERATIVE OPIOID TAPER INSTRUCTIONS: It is important to wean off of your opioid medication as soon as possible. If you do not need pain medication after your surgery it is ok to stop day one. Opioids include: Codeine, Hydrocodone(Norco, Vicodin), Oxycodone(Percocet, oxycontin) and hydromorphone amongst others.  Long term and even short term use of opiods can cause: Increased pain response Dependence Constipation Depression Respiratory depression And more.  Withdrawal symptoms can include Flu like symptoms Nausea, vomiting And more Techniques to manage these symptoms Hydrate well Eat regular healthy meals Stay active Use relaxation techniques(deep breathing, meditating, yoga) Do Not substitute Alcohol to help with tapering If you have been on opioids for less than two weeks and do not have pain than it is ok to stop all together.  Plan to wean off of opioids This plan should start within one week post op of your joint replacement. Maintain the same interval or time between taking each dose and first decrease the dose.  Cut the total daily intake of opioids by one tablet each day Next start to increase the time between doses. The last dose that should be eliminated is the evening dose.      TED hose   Complete by: As directed    Use stockings (TED hose) for three weeks on both leg(s).  You  may remove them at night for sleeping.   Weight bearing as tolerated   Complete by: As directed         Follow-up Information     Aluisio, Pilar Plate, MD. Schedule an appointment as soon as possible for a visit in 2 week(s).   Specialty: Orthopedic Surgery Contact information: [address] [place] [address] [phone]                  Signed: Theresa Duty 08/26/2021, 9:53 AM

## 2021-08-27 DIAGNOSIS — M25561 Pain in right knee: Secondary | ICD-10-CM | POA: Diagnosis not present

## 2021-08-31 DIAGNOSIS — M25561 Pain in right knee: Secondary | ICD-10-CM | POA: Diagnosis not present

## 2021-09-02 DIAGNOSIS — M25561 Pain in right knee: Secondary | ICD-10-CM | POA: Diagnosis not present

## 2021-09-04 DIAGNOSIS — M25561 Pain in right knee: Secondary | ICD-10-CM | POA: Diagnosis not present

## 2021-09-07 DIAGNOSIS — M25561 Pain in right knee: Secondary | ICD-10-CM | POA: Diagnosis not present

## 2021-09-09 DIAGNOSIS — M25561 Pain in right knee: Secondary | ICD-10-CM | POA: Diagnosis not present

## 2021-09-11 DIAGNOSIS — M25561 Pain in right knee: Secondary | ICD-10-CM | POA: Diagnosis not present

## 2021-09-15 DIAGNOSIS — M25561 Pain in right knee: Secondary | ICD-10-CM | POA: Diagnosis not present

## 2021-09-17 DIAGNOSIS — M25561 Pain in right knee: Secondary | ICD-10-CM | POA: Diagnosis not present

## 2021-09-22 DIAGNOSIS — M25561 Pain in right knee: Secondary | ICD-10-CM | POA: Diagnosis not present

## 2021-09-24 DIAGNOSIS — M25561 Pain in right knee: Secondary | ICD-10-CM | POA: Diagnosis not present

## 2021-09-29 ENCOUNTER — Other Ambulatory Visit (HOSPITAL_BASED_OUTPATIENT_CLINIC_OR_DEPARTMENT_OTHER): Payer: Self-pay | Admitting: Cardiovascular Disease

## 2021-10-01 ENCOUNTER — Other Ambulatory Visit (HOSPITAL_COMMUNITY): Payer: Self-pay

## 2021-10-01 DIAGNOSIS — M25561 Pain in right knee: Secondary | ICD-10-CM | POA: Diagnosis not present

## 2021-10-01 MED ORDER — ROSUVASTATIN CALCIUM 10 MG PO TABS
10.0000 mg | ORAL_TABLET | Freq: Every day | ORAL | 0 refills | Status: DC
  Filled 2021-10-01: qty 30, 30d supply, fill #0

## 2021-10-01 NOTE — Telephone Encounter (Signed)
Rx(s) sent to pharmacy electronically.  

## 2021-10-05 ENCOUNTER — Ambulatory Visit (INDEPENDENT_AMBULATORY_CARE_PROVIDER_SITE_OTHER): Payer: 59 | Admitting: Family Medicine

## 2021-10-05 ENCOUNTER — Other Ambulatory Visit (HOSPITAL_COMMUNITY): Payer: Self-pay

## 2021-10-05 ENCOUNTER — Other Ambulatory Visit: Payer: Self-pay

## 2021-10-05 ENCOUNTER — Encounter (INDEPENDENT_AMBULATORY_CARE_PROVIDER_SITE_OTHER): Payer: Self-pay | Admitting: Family Medicine

## 2021-10-05 VITALS — BP 109/70 | HR 62 | Temp 97.4°F | Ht 65.0 in | Wt 200.0 lb

## 2021-10-05 DIAGNOSIS — Z6838 Body mass index (BMI) 38.0-38.9, adult: Secondary | ICD-10-CM

## 2021-10-05 DIAGNOSIS — M25561 Pain in right knee: Secondary | ICD-10-CM | POA: Diagnosis not present

## 2021-10-05 DIAGNOSIS — E669 Obesity, unspecified: Secondary | ICD-10-CM

## 2021-10-05 DIAGNOSIS — Z170 Estrogen receptor positive status [ER+]: Secondary | ICD-10-CM

## 2021-10-05 DIAGNOSIS — R7303 Prediabetes: Secondary | ICD-10-CM | POA: Diagnosis not present

## 2021-10-05 DIAGNOSIS — C50412 Malignant neoplasm of upper-outer quadrant of left female breast: Secondary | ICD-10-CM

## 2021-10-05 DIAGNOSIS — Z6833 Body mass index (BMI) 33.0-33.9, adult: Secondary | ICD-10-CM

## 2021-10-05 DIAGNOSIS — G8929 Other chronic pain: Secondary | ICD-10-CM | POA: Diagnosis not present

## 2021-10-05 MED ORDER — LETROZOLE 2.5 MG PO TABS
ORAL_TABLET | Freq: Every day | ORAL | 1 refills | Status: DC
  Filled 2021-10-05: qty 90, 90d supply, fill #0

## 2021-10-05 MED ORDER — TIRZEPATIDE 5 MG/0.5ML ~~LOC~~ SOAJ
5.0000 mg | SUBCUTANEOUS | 3 refills | Status: DC
  Filled 2021-10-05: qty 6, 84d supply, fill #0
  Filled 2022-01-05: qty 2, 28d supply, fill #1

## 2021-10-06 DIAGNOSIS — M25561 Pain in right knee: Secondary | ICD-10-CM | POA: Diagnosis not present

## 2021-10-06 DIAGNOSIS — Z4789 Encounter for other orthopedic aftercare: Secondary | ICD-10-CM | POA: Diagnosis not present

## 2021-10-06 DIAGNOSIS — Z96651 Presence of right artificial knee joint: Secondary | ICD-10-CM | POA: Diagnosis not present

## 2021-10-06 NOTE — Progress Notes (Signed)
Chief Complaint:   OBESITY Melissa [name] is here to discuss her progress with her obesity treatment plan along with follow-up of her obesity related diagnoses. See Medical Weight Management Flowsheet for complete bioelectrical impedance results.  Today's visit was #: 11 Starting weight: 229 lbs Starting date: 12/09/2020 Weight change since last visit: 10 lbs Total lbs lost to date: 29 lbs Total weight loss percentage to date: -12.66%  Nutrition Plan: Keeping a food journal and adhering to recommended goals of 1200 calories and 95 grams of protein daily for 50% of the time. Activity: Strength training for 45-60 minutes 4 times per week. Anti-obesity medications: Mounjaro 5 mg subcutaneously weekly. Reported side effects: None.  Interim History: Melissa [name] had right knee TKA last month.  She says she is healing well.  She is in PT and is going to restart with her trainer (upper extremities).  She is tolerating Mounjaro.  She says she would like to discontinue the medication once she is at her goal weight.  Assessment/Plan:   1. Prediabetes, with polyphagia Improving, but not optimized. Goal is HgbA1c < 5.7.  Medication: Mounjaro 5 mg subcutaneously weekly.    Plan:  Continue Mounjaro 5 mg subcutaneously weekly.  Will refill today, as per below.  She will continue to focus on protein-rich, low simple carbohydrate foods. We reviewed the importance of hydration, regular exercise for stress reduction, and restorative sleep.   Lab Results  Component Value Date   HGBA1C 5.8 (H) 08/11/2021   Lab Results  Component Value Date   INSULIN 18.7 12/09/2020   - Refill tirzepatide (MOUNJARO) 5 MG/0.5ML Pen; Inject 5 mg into the skin once a week.  Dispense: 6 mL; Refill: 3  2. Chronic pain of right knee Melissa [name] is status post TKA on 08/24/2021.  She is currently in PT. We will continue to monitor symptoms as they relate to her weight loss journey.  3. Malignant neoplasm of upper-outer quadrant of left  breast in female, estrogen receptor positive ([place]) Melissa [name] is taking Femara 2.5 mg daily.  Will refill this for her today.  - Refill letrozole (FEMARA) 2.5 MG tablet; TAKE 1 TABLET BY MOUTH ONCE A DAY  Dispense: 90 tablet; Refill: 1  4. Obesity, current BMI 33.3  Course: Melissa [name] is currently in the action stage of change. As such, her goal is to continue with weight loss efforts.   Nutrition goals: She has agreed to keeping a food journal and adhering to recommended goals of 1200 calories and 95 grams of protein.   Exercise goals:  As is.  Behavioral modification strategies: increasing lean protein intake, decreasing simple carbohydrates, and increasing vegetables.  Melissa [name] has agreed to follow-up with our clinic in 4 weeks. She was informed of the importance of frequent follow-up visits to maximize her success with intensive lifestyle modifications for her multiple health conditions.   Objective:   Blood pressure 109/70, pulse 62, temperature (!) 97.4 F (36.3 C), temperature source Oral, height '5\' 5"'$  (1.651 m), weight 200 lb (90.7 kg), SpO2 99 %. Body mass index is 33.28 kg/m.  General: Cooperative, alert, well developed, in no acute distress. HEENT: Conjunctivae and lids unremarkable. Cardiovascular: Regular rhythm.  Lungs: Normal work of breathing. Neurologic: No focal deficits.   Lab Results  Component Value Date   CREATININE 0.70 08/25/2021   BUN 12 08/25/2021   NA 134 (L) 08/25/2021   K 4.2 08/25/2021   CL 104 08/25/2021   CO2 23 08/25/2021   Lab Results  Component Value  Date   ALT 20 08/11/2021   AST 22 08/11/2021   ALKPHOS 82 08/11/2021   BILITOT 1.0 08/11/2021   Lab Results  Component Value Date   HGBA1C 5.8 (H) 08/11/2021   HGBA1C 6.5 (H) 12/09/2020   Lab Results  Component Value Date   INSULIN 18.7 12/09/2020   Lab Results  Component Value Date   TSH 1.340 12/09/2020   Lab Results  Component Value Date   CHOL 155 12/09/2020   HDL 68 12/09/2020    LDLCALC 75 12/09/2020   TRIG 59 12/09/2020   CHOLHDL 2.3 12/09/2020   Lab Results  Component Value Date   VD25OH 47.3 12/09/2020   Lab Results  Component Value Date   WBC 8.6 08/25/2021   HGB 11.6 (L) 08/25/2021   HCT 36.4 08/25/2021   MCV 85.6 08/25/2021   PLT 197 08/25/2021   Lab Results  Component Value Date   IRON 102 12/09/2020   TIBC 362 12/09/2020   FERRITIN 235 (H) 12/09/2020   Attestation Statements:   Reviewed by clinician on day of visit: allergies, medications, problem list, medical history, surgical history, family history, social history, and previous encounter notes.  I, Water quality scientist, CMA, am acting as transcriptionist for Briscoe Deutscher, DO  I have reviewed the above documentation for accuracy and completeness, and I agree with the above. -  Briscoe Deutscher, DO, MS, FAAFP, DABOM - Family and Bariatric Medicine.

## 2021-10-09 DIAGNOSIS — M25561 Pain in right knee: Secondary | ICD-10-CM | POA: Diagnosis not present

## 2021-10-12 DIAGNOSIS — M25561 Pain in right knee: Secondary | ICD-10-CM | POA: Diagnosis not present

## 2021-10-14 DIAGNOSIS — M25561 Pain in right knee: Secondary | ICD-10-CM | POA: Diagnosis not present

## 2021-10-26 NOTE — Progress Notes (Signed)
? ?Cardiology Office Note  ? ?Date:  10/27/2021  ? ?ID:  Melissa [name], DOB Dec 18, 1950, MRN [id number] ? ?PCP:  Melissa Huddle, MD  ?Cardiologist:  Melissa Latch, MD  ?Electrophysiologist:  None  ? ?Evaluation Performed:  Follow-Up Visit ? ?Chief Complaint:  Coronary calcification ? ?History of Present Illness:   ? ?Melissa [name] is a 71 y.o. female with asymptomatic coronary calcification, hyperlipidemia, and recurrent breast cancer status post lumpectomy and XRT who presents for follow up.   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 She has several family members who died of cardiovascular disease and her brother had recently died from a heart attack. Her father had a heart attack in his 88s and several paternal uncles also had heart attacks in their 69s. She denied chest pain but did report exertional dyspnea.  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  Exercise Cardiolite 06/2015 was negative for ischemia.  She achieved 8.5 METS. 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  She had proximal LAD plaque.  She was started on rosuvastatin. ? 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 Rosuvastatin was reduced to every other day due to myalgias.  She previously reported occasional episodes of heart fluttering that lasted 10 to 15 seconds.  Given that the episodes were sporadic, an ambulatory monitor was not placed at that time.   ? ?At her last appointment, she was doing well and exercising more frequently. She underwent total R knee arthroplasty 07/2021. She was started on [facility] which has helped her to lose weight. Today, she is doing well. Her R knee is still stiff and painful with certain positions. She is no longer taking an aspirin but is open to continuing to take a daily aspirin. She recently completed physical  therapy but exercises with a personal trainer twice a week and on her own once a week. She is hoping to increase her exercise to 4 times a week but feels fatigued after working out. She endorses edema around her R knee. Recently, she gained 4 lbs. Since starting [facility], she reports indigestion and has been eating smaller portions. She is planing to stop the [facility] in May. She denies any palpitations, chest pain, or shortness of breath, lightheadedness, headaches, syncope, orthopnea, or PND. ? ?Past Medical History:  ?Diagnosis Date  ? Anemia   ? AR (aortic regurgitation) 06/2015  ? Trivial, Noted on ECHO  ? Breast cancer ([place]) 07/23/97  ? left, tx w/xrt, Tamoxifen x 5 yrs  ? Chronic pain   ? left knee  ? Constipation   ? Coronary artery calcification 04/10/2020  ? Mild proximal LAD seen on coronary CT-A in 2018  ? DDD (degenerative disc disease), lumbar 04/05/2018  ? xray  ? Diastolic dysfunction without heart failure 09/01/2015  ? Grade 1 diastolic dysfunction on echo 06/2015  ? Diverticulitis   ? pt unaware  ? DVT complicating pregnancy   ? Right calf, surface  ? Foot pain, bilateral   ? Gallbladder problem   ? H/O blood clots   ? History of bladder infections   ? History of endometriosis   ? Hx of radiation therapy 08/22/97 - 10/03/97  ? left breast  ? Joint pain   ? Knee pain   ? MR (mitral regurgitation) 06/2015  ? Noted on ECHO  ?  OA (osteoarthritis)   ? Bilateral Knees  ? Obesity   ? Obesity (BMI 30-39.9) 04/10/2020  ? Personal history of radiation therapy   ? Prediabetes   ? Pure hypercholesterolemia 04/10/2020  ? Recurrent breast cancer ([place]) 01/04/12  ? biopsy, ER/PR+, Her 2 -  ? Shortness of breath 06/26/2015, 09/07/2016  ? TR (tricuspid regurgitation) 06/2015  ? Noted on ECHO  ? Vitamin D deficiency   ? ? ?Past Surgical History:  ?Procedure Laterality Date  ? ABDOMINAL HYSTERECTOMY  1987  ? endometriosos  ? BREAST BIOPSY Left 01/04/2012  ? BREAST LUMPECTOMY  02/14/2012  ? LUMPECTOMY;  Surgeon: Melissa Lasso, MD;  Location: [facility];  Service: General;  Laterality: Left;  ? [place]  ? lumpectomy-left  ? CESAREAN SECTION    ? x 3  ? CHOLECYSTECTOMY  1993  ? COLONOSCOPY    ? HERNIA REPAIR  2585  ? umbilical   ? ovary removed  1987  ? TOTAL KNEE ARTHROPLASTY Left 07/10/2018  ? Procedure: LEFT TOTAL KNEE ARTHROPLASTY;  Surgeon: Melissa Arabian, MD;  Location: [facility];  Service: Orthopedics;  Laterality: Left;  68mn  ? TOTAL KNEE ARTHROPLASTY Right 08/24/2021  ? Procedure: TOTAL KNEE ARTHROPLASTY;  Surgeon: AGaynelle Arabian MD;  Location: [facility];  Service: Orthopedics;  Laterality: Right;  ? ? ? ?Current Outpatient Medications  ?Medication Sig Dispense Refill  ? estradiol (ESTRACE) 0.1 MG/GM vaginal cream Apply 1/2 gram (pea-sized amount) vaginally once a week (Patient taking differently: 2 (two) times a week. 1/2 gram) 42.5 g 1  ? letrozole (FEMARA) 2.5 MG tablet TAKE 1 TABLET BY MOUTH ONCE A DAY 90 tablet 1  ? tirzepatide (MOUNJARO) 5 MG/0.5ML Pen Inject 5 mg into the skin once a week. 6 mL 3  ? rosuvastatin (CRESTOR) 10 MG tablet Take 1 tablet (10 mg total) by mouth daily. 90 tablet 3  ? ?No current facility-administered medications for this visit.  ? ? ?Allergies:   Patient has no known allergies.  ? ? ?Social History:  The patient  reports that she has never smoked. She has never used smokeless tobacco. She reports that she does not drink alcohol and does not use drugs.  ? ?Family History:  The patient's family history includes Alcohol abuse in her father; Breast cancer in her maternal aunt; Cancer in her maternal aunt, maternal aunt, maternal grandfather, and paternal aunt; Cancer (age of onset: 893 in her mother; Heart attack in her brother, father, and paternal grandfather; Heart disease in her father; Heart failure in her sister; Hypertension in her mother; Obesity in her mother; Prostate cancer in her maternal uncle; Sudden death in her father.  ? ? ?ROS:  Please see the history of present illness.    ?(+) R knee stiffness/pain ?(+) LE edema (R knee) ?(+) Fatigue ?All other systems are reviewed and negative.  ? ? ?PHYSICAL EXAM: ?VS:  BP 110/70 (BP Location: Right Arm, Patient Position: Sitting, Cuff Size: Large)   Pulse 65   Ht '5\' 5"'$  (1.651 m)   Wt 204 lb 4.8 oz (92.7 kg)   BMI 34.00 kg/m?  , BMI Body mass index is 34 kg/m?. ?GENERAL:  Well appearing ?HEENT: Pupils equal round and reactive, fundi not visualized, oral mucosa unremarkable ?NECK:  No jugular venous distention, waveform within normal limits, carotid upstroke brisk and symmetric, no bruits, no thyromegaly ?LYMPHATICS:  No cervical adenopathy ?LUNGS:  Clear to auscultation bilaterally ?HEART:  Irregular rate regular rhythm.  PMI not displaced  or sustained,S1 and S2 within normal limits, no S3, no S4, no clicks, no rubs, no murmurs ?ABD:  Flat, positive bowel sounds normal in frequency in pitch, no bruits, no rebound, no guarding, no midline pulsatile mass, no hepatomegaly, no splenomegaly ?EXT:  2 plus pulses throughout, no edema, no cyanosis no clubbing ?SKIN:  No rashes no nodules ?NEURO:  Cranial nerves II through XII grossly intact, motor grossly intact throughout ?PSYCH:  Cognitively intact, oriented to person place and time ? ?EKG:  ?10/27/21: Sinus arrhythmia, rate 65 bpm; LAFB, non-specific ST changes ?04/10/20: Sinus rhythm.  Rate 65 bpm.  Anterolateral lateral T wave inversions unchanged from prior. ?04/03/19: Sinus rhythm.  Rate 70 bpm.   Anterolateral T wave inversions.  Unchanged from prior. ?10/05/17: Sinus rhythm.  Rate 62 bpm.  Anterolateral and inferior T wave inversions.  ?09/07/16: Sinus rhythm.  Rate 71.  LAFB.  Diffuse TWI anterolateral and inferior TWI ? ?Coronary calcium score 09/23/16: ?IMPRESSION: ?1. Coronary artery calcium score 5.8 Agatston units. This places the ?patient in the 59th percentile for age and gender. This suggests ?intermediate risk for future cardiac events. ?  ?2. Mixed plaque in proximal LAD without significant  stenosis. No other coronary disease noted. ? ?Exercise Cardiolite 07/16/15: ?The left ventricular ejection fraction is normal (55-65%). ?Nuclear stress EF: 57%. ?There was no ST segment deviation noted during stress.

## 2021-10-27 ENCOUNTER — Ambulatory Visit (HOSPITAL_BASED_OUTPATIENT_CLINIC_OR_DEPARTMENT_OTHER): Payer: 59 | Admitting: Cardiovascular Disease

## 2021-10-27 ENCOUNTER — Encounter (HOSPITAL_BASED_OUTPATIENT_CLINIC_OR_DEPARTMENT_OTHER): Payer: Self-pay | Admitting: Cardiovascular Disease

## 2021-10-27 ENCOUNTER — Other Ambulatory Visit (HOSPITAL_COMMUNITY): Payer: Self-pay

## 2021-10-27 VITALS — BP 110/70 | HR 65 | Ht 65.0 in | Wt 204.3 lb

## 2021-10-27 DIAGNOSIS — I2510 Atherosclerotic heart disease of native coronary artery without angina pectoris: Secondary | ICD-10-CM | POA: Diagnosis not present

## 2021-10-27 DIAGNOSIS — E669 Obesity, unspecified: Secondary | ICD-10-CM

## 2021-10-27 DIAGNOSIS — R0602 Shortness of breath: Secondary | ICD-10-CM

## 2021-10-27 DIAGNOSIS — I2584 Coronary atherosclerosis due to calcified coronary lesion: Secondary | ICD-10-CM | POA: Diagnosis not present

## 2021-10-27 DIAGNOSIS — E7800 Pure hypercholesterolemia, unspecified: Secondary | ICD-10-CM | POA: Diagnosis not present

## 2021-10-27 MED ORDER — ROSUVASTATIN CALCIUM 10 MG PO TABS
10.0000 mg | ORAL_TABLET | Freq: Every day | ORAL | 3 refills | Status: DC
  Filled 2021-10-27: qty 90, 90d supply, fill #0
  Filled 2022-02-05: qty 90, 90d supply, fill #1
  Filled 2022-05-08: qty 90, 90d supply, fill #2
  Filled 2022-08-19: qty 90, 90d supply, fill #3

## 2021-10-27 NOTE — Assessment & Plan Note (Signed)
Sees very mild calcification noted in the LAD on her coronary CTA in 2018.  She is asymptomatic.  She does get very fatigued after exercising but is able to exercise okay.  She has not been sleeping well and had recent knee surgery.  For now, we will continue to monitor.  If her exertional tolerance does not improve with persistent exercise, we will consider repeating a coronary CTA or getting a stress test.  She will resume her aspirin 81 mg and continue her statin.  LDL goal is less than 70. ?

## 2021-10-27 NOTE — Assessment & Plan Note (Signed)
Lipids are stable on rosuvastatin.  LDL goal is less than 70.  She will have them rechecked with her PCP in a couple months. ?

## 2021-10-27 NOTE — Assessment & Plan Note (Signed)
Stable.  She will keep working on diet, exercise, and weight loss. ?

## 2021-10-27 NOTE — Assessment & Plan Note (Signed)
She has been doing a good job of losing weight lately.  She has been taking Mounjaro with some success.  However she does not like the side effects and plans to stop it in May.  We discussed the importance of continuing with her diet afterwards. ?

## 2021-10-27 NOTE — Patient Instructions (Addendum)
Medication Instructions:  ?Your physician recommends that you continue on your current medications as directed. Please refer to the Current Medication list given to you today.  ? ?*If you need a refill on your cardiac medications before your next appointment, please call your pharmacy* ? ?Lab Work: ?[place] TO [phone] ? ?Testing/Procedures: ?NONE ? ?Follow-Up: ?At [facility], you and your health needs are our priority.  As part of our continuing mission to provide you with exceptional heart care, we have created designated Provider Care Teams.  These Care Teams include your primary Cardiologist (physician) and Advanced Practice Providers (APPs -  Physician Assistants and Nurse Practitioners) who all work together to provide you with the care you need, when you need it. ? ?We recommend signing up for the patient portal called "MyChart".  Sign up information is provided on this After Visit Summary.  MyChart is used to connect with patients for Virtual Visits (Telemedicine).  Patients are able to view lab/test results, encounter notes, upcoming appointments, etc.  Non-urgent messages can be sent to your provider as well.   ?To learn more about what you can do with MyChart, go to [email or web address].   ? ?Your next appointment:   ?12 month(s) ? ?The format for your next appointment:   ?In Person ? ?Provider:   ?Skeet Latch, MD{ ? ?

## 2021-10-28 ENCOUNTER — Other Ambulatory Visit (HOSPITAL_COMMUNITY): Payer: Self-pay

## 2021-10-28 MED ORDER — CEPHALEXIN 250 MG PO CAPS
ORAL_CAPSULE | ORAL | 3 refills | Status: DC
  Filled 2021-10-28: qty 12, 28d supply, fill #0
  Filled 2021-11-23: qty 12, 28d supply, fill #1
  Filled 2021-12-23: qty 12, 28d supply, fill #2
  Filled 2022-04-14: qty 12, 28d supply, fill #3
  Filled 2022-05-08: qty 12, 28d supply, fill #4
  Filled 2022-06-07: qty 12, 28d supply, fill #5
  Filled 2022-07-05: qty 12, 28d supply, fill #6
  Filled 2022-08-09 – 2022-08-10 (×2): qty 12, 28d supply, fill #7
  Filled 2022-08-19 – 2022-09-01 (×2): qty 12, 28d supply, fill #8
  Filled 2022-09-29: qty 12, 28d supply, fill #9
  Filled 2022-10-26: qty 12, 28d supply, fill #10

## 2021-11-03 ENCOUNTER — Encounter (INDEPENDENT_AMBULATORY_CARE_PROVIDER_SITE_OTHER): Payer: Self-pay | Admitting: Family Medicine

## 2021-11-03 ENCOUNTER — Telehealth (INDEPENDENT_AMBULATORY_CARE_PROVIDER_SITE_OTHER): Payer: 59 | Admitting: Family Medicine

## 2021-11-03 ENCOUNTER — Other Ambulatory Visit (HOSPITAL_COMMUNITY): Payer: Self-pay

## 2021-11-03 VITALS — Ht 65.0 in | Wt 198.0 lb

## 2021-11-03 DIAGNOSIS — R7303 Prediabetes: Secondary | ICD-10-CM | POA: Diagnosis not present

## 2021-11-03 DIAGNOSIS — I2510 Atherosclerotic heart disease of native coronary artery without angina pectoris: Secondary | ICD-10-CM

## 2021-11-03 DIAGNOSIS — Z6833 Body mass index (BMI) 33.0-33.9, adult: Secondary | ICD-10-CM | POA: Diagnosis not present

## 2021-11-03 DIAGNOSIS — E669 Obesity, unspecified: Secondary | ICD-10-CM

## 2021-11-03 DIAGNOSIS — I2584 Coronary atherosclerosis due to calcified coronary lesion: Secondary | ICD-10-CM

## 2021-11-03 MED ORDER — TIRZEPATIDE 7.5 MG/0.5ML ~~LOC~~ SOAJ
7.5000 mg | SUBCUTANEOUS | 0 refills | Status: DC
  Filled 2021-11-03: qty 6, 84d supply, fill #0

## 2021-11-03 NOTE — Progress Notes (Signed)
TeleHealth Visit:  Due to the COVID-19 pandemic, this visit was completed with telemedicine (audio/video) technology to reduce patient and provider exposure as well as to preserve personal protective equipment.   Melissa [name] has verbally consented to this TeleHealth visit. The patient is located at home, the provider is located at home. The participants in this visit include the listed provider and patient. The visit was conducted today via MyChart video.  OBESITY Melissa [name] is here to discuss her progress with her obesity treatment plan along with follow-up of her obesity related diagnoses.   Today's visit was #: 12 Starting weight: 229 lbs Starting date: 12/09/2020 Today's date: 11/03/2021  Today's Weight: 198 lbs (down 2 lbs)  Nutrition Plan: keeping a food journal and adhering to recommended goals of 1200 calories and 95 grams of protein for 35% of the time.  Anti-obesity medications: Mounjaro 5 mg subcutaneously weekly. Reported side effects: None. Hunger is moderately controlled. Cravings are moderately controlled.  Activity: Strength training/cardio 60-75 minutes 4 times per week.  Assessment/Plan:   Diagnoses and all orders for this visit:  Prediabetes, with polyphagia Improving, but not optimized. Goal is HgbA1c < 5.7.  Medication: Mounjaro 5 mg [place] weekly. .    Plan: She will continue to focus on protein-rich, low simple carbohydrate foods. We reviewed the importance of hydration, regular exercise for stress reduction, and restorative sleep.   Lab Results  Component Value Date   HGBA1C 5.8 (H) 08/11/2021   Lab Results  Component Value Date   INSULIN 18.7 12/09/2020   -     tirzepatide (MOUNJARO) 7.5 MG/0.5ML Pen; Inject 7.5 mg into the skin once a week.  Obesity, current BMI 33.3 Melissa [name] is currently in the action stage of change. As such, her goal is to continue with weight loss efforts. She has agreed to practicing portion control and making smarter food choices, such as  increasing vegetables and decreasing simple carbohydrates.   Exercise goals: For substantial health benefits, adults should do at least 150 minutes (2 hours and 30 minutes) a week of moderate-intensity, or 75 minutes (1 hour and 15 minutes) a week of vigorous-intensity aerobic physical activity, or an equivalent combination of moderate- and vigorous-intensity aerobic activity. Aerobic activity should be performed in episodes of at least 10 minutes, and preferably, it should be spread throughout the week. Adults should also include muscle-strengthening activities that involve all major muscle groups on 2 or more days a week.  Behavioral modification strategies: increasing lean protein intake, decreasing simple carbohydrates, increasing vegetables, and increasing water intake.  Melissa [name] has agreed to follow-up with our clinic in 4 weeks. She was informed of the importance of frequent follow-up visits to maximize her success with intensive lifestyle modifications for her multiple health conditions.  Objective:   VITALS: Per patient if applicable, see vitals. GENERAL: Alert and in no acute distress. CARDIOPULMONARY: No increased WOB. Speaking in clear sentences.  PSYCH: Pleasant and cooperative. Speech normal rate and rhythm. Affect is appropriate. Insight and judgement are appropriate. Attention is focused, linear, and appropriate.  NEURO: Oriented as arrived to appointment on time with no prompting.   Lab Results  Component Value Date   CREATININE 0.70 08/25/2021   BUN 12 08/25/2021   NA 134 (L) 08/25/2021   K 4.2 08/25/2021   CL 104 08/25/2021   CO2 23 08/25/2021   Lab Results  Component Value Date   ALT 20 08/11/2021   AST 22 08/11/2021   ALKPHOS 82 08/11/2021   BILITOT 1.0 08/11/2021  Lab Results  Component Value Date   HGBA1C 5.8 (H) 08/11/2021   HGBA1C 6.5 (H) 12/09/2020   Lab Results  Component Value Date   INSULIN 18.7 12/09/2020   Lab Results  Component Value Date    TSH 1.340 12/09/2020   Lab Results  Component Value Date   CHOL 155 12/09/2020   HDL 68 12/09/2020   LDLCALC 75 12/09/2020   TRIG 59 12/09/2020   CHOLHDL 2.3 12/09/2020   Lab Results  Component Value Date   WBC 8.6 08/25/2021   HGB 11.6 (L) 08/25/2021   HCT 36.4 08/25/2021   MCV 85.6 08/25/2021   PLT 197 08/25/2021   Lab Results  Component Value Date   IRON 102 12/09/2020   TIBC 362 12/09/2020   FERRITIN 235 (H) 12/09/2020   Attestation Statements:   Reviewed by clinician on day of visit: allergies, medications, problem list, medical history, surgical history, family history, social history, and previous encounter notes.

## 2021-11-05 ENCOUNTER — Encounter (INDEPENDENT_AMBULATORY_CARE_PROVIDER_SITE_OTHER): Payer: Self-pay

## 2021-11-09 ENCOUNTER — Telehealth (INDEPENDENT_AMBULATORY_CARE_PROVIDER_SITE_OTHER): Payer: Self-pay | Admitting: Family Medicine

## 2021-11-09 ENCOUNTER — Encounter (INDEPENDENT_AMBULATORY_CARE_PROVIDER_SITE_OTHER): Payer: Self-pay

## 2021-11-09 NOTE — Telephone Encounter (Signed)
Prior authorization denied for [facility]. Per insurance: patient does not have type 2 diabetes. Patient sent denial message via mychart.  ?

## 2021-11-13 DIAGNOSIS — C50912 Malignant neoplasm of unspecified site of left female breast: Secondary | ICD-10-CM | POA: Diagnosis not present

## 2021-11-18 ENCOUNTER — Other Ambulatory Visit (HOSPITAL_COMMUNITY): Payer: Self-pay

## 2021-11-23 ENCOUNTER — Other Ambulatory Visit (HOSPITAL_COMMUNITY): Payer: Self-pay

## 2021-12-22 DIAGNOSIS — R7303 Prediabetes: Secondary | ICD-10-CM | POA: Diagnosis not present

## 2021-12-22 DIAGNOSIS — E7800 Pure hypercholesterolemia, unspecified: Secondary | ICD-10-CM | POA: Diagnosis not present

## 2021-12-22 DIAGNOSIS — I5189 Other ill-defined heart diseases: Secondary | ICD-10-CM | POA: Diagnosis not present

## 2021-12-22 DIAGNOSIS — E669 Obesity, unspecified: Secondary | ICD-10-CM | POA: Diagnosis not present

## 2021-12-23 ENCOUNTER — Other Ambulatory Visit (HOSPITAL_COMMUNITY): Payer: Self-pay

## 2022-01-06 ENCOUNTER — Other Ambulatory Visit (HOSPITAL_COMMUNITY): Payer: Self-pay

## 2022-01-07 ENCOUNTER — Other Ambulatory Visit (HOSPITAL_COMMUNITY): Payer: Self-pay

## 2022-01-07 ENCOUNTER — Ambulatory Visit: Payer: 59 | Admitting: Dermatology

## 2022-01-07 ENCOUNTER — Encounter: Payer: Self-pay | Admitting: Dermatology

## 2022-01-07 DIAGNOSIS — B359 Dermatophytosis, unspecified: Secondary | ICD-10-CM

## 2022-01-07 MED ORDER — MOUNJARO 5 MG/0.5ML ~~LOC~~ SOAJ
SUBCUTANEOUS | 0 refills | Status: DC
  Filled 2022-01-07: qty 2, 28d supply, fill #0

## 2022-01-08 ENCOUNTER — Other Ambulatory Visit (HOSPITAL_COMMUNITY): Payer: Self-pay

## 2022-01-18 ENCOUNTER — Other Ambulatory Visit: Payer: Self-pay | Admitting: Internal Medicine

## 2022-01-18 DIAGNOSIS — Z1231 Encounter for screening mammogram for malignant neoplasm of breast: Secondary | ICD-10-CM

## 2022-01-20 ENCOUNTER — Telehealth: Payer: Self-pay | Admitting: *Deleted

## 2022-01-20 NOTE — Telephone Encounter (Signed)
-----   Message from Lavonna Monarch, MD sent at 01/19/2022  8:52 PM EDT ----- Although this yeast organism is most often a contaminant rather than a cause of nail infection, I found a case report from 2019 involving the thumbnail with lifting and greenish discoloration that completely cleared with oral antifungals.  We will prescribe fluconazole 100 mg tablets which she is to take 2 daily for 1 week and then take 3 weeks off.  This is repeated on a 4-week cycle of  2 pills daily for 1 week and then 3 weeks off for a total of 16 weeks.  She will need a total of 56 pills with no refills.  It may take an additional 3 to 4 months after she is finished to see whether the nail will clear.  There is a rare risk of allergic reaction showing up as rash or liver inflammation so if she has any problems she should stop the therapy and contact me.

## 2022-01-20 NOTE — Telephone Encounter (Signed)
Phone call to patient to give results but patient mailbox is full. Will try back again later.

## 2022-01-21 ENCOUNTER — Other Ambulatory Visit (HOSPITAL_COMMUNITY): Payer: Self-pay

## 2022-01-21 DIAGNOSIS — E7800 Pure hypercholesterolemia, unspecified: Secondary | ICD-10-CM | POA: Diagnosis not present

## 2022-01-21 DIAGNOSIS — Z853 Personal history of malignant neoplasm of breast: Secondary | ICD-10-CM | POA: Diagnosis not present

## 2022-01-21 DIAGNOSIS — D649 Anemia, unspecified: Secondary | ICD-10-CM | POA: Diagnosis not present

## 2022-01-21 DIAGNOSIS — Z79899 Other long term (current) drug therapy: Secondary | ICD-10-CM | POA: Diagnosis not present

## 2022-01-21 DIAGNOSIS — E669 Obesity, unspecified: Secondary | ICD-10-CM | POA: Diagnosis not present

## 2022-01-21 DIAGNOSIS — Z8249 Family history of ischemic heart disease and other diseases of the circulatory system: Secondary | ICD-10-CM | POA: Diagnosis not present

## 2022-01-21 DIAGNOSIS — E119 Type 2 diabetes mellitus without complications: Secondary | ICD-10-CM | POA: Diagnosis not present

## 2022-01-21 DIAGNOSIS — R3129 Other microscopic hematuria: Secondary | ICD-10-CM | POA: Diagnosis not present

## 2022-01-21 DIAGNOSIS — Z0000 Encounter for general adult medical examination without abnormal findings: Secondary | ICD-10-CM | POA: Diagnosis not present

## 2022-01-21 DIAGNOSIS — N390 Urinary tract infection, site not specified: Secondary | ICD-10-CM | POA: Diagnosis not present

## 2022-01-21 DIAGNOSIS — M179 Osteoarthritis of knee, unspecified: Secondary | ICD-10-CM | POA: Diagnosis not present

## 2022-01-21 DIAGNOSIS — E559 Vitamin D deficiency, unspecified: Secondary | ICD-10-CM | POA: Diagnosis not present

## 2022-01-21 MED ORDER — MOUNJARO 5 MG/0.5ML ~~LOC~~ SOAJ: SUBCUTANEOUS | 3 refills | Status: DC

## 2022-01-21 MED ORDER — CEPHALEXIN 250 MG PO CAPS
ORAL_CAPSULE | ORAL | 0 refills | Status: DC
  Filled 2022-01-21: qty 36, 84d supply, fill #0

## 2022-01-21 MED ORDER — LETROZOLE 2.5 MG PO TABS
ORAL_TABLET | ORAL | 0 refills | Status: DC
  Filled 2022-01-21: qty 90, 90d supply, fill #0

## 2022-01-22 ENCOUNTER — Other Ambulatory Visit: Payer: Self-pay | Admitting: Internal Medicine

## 2022-01-22 DIAGNOSIS — M8588 Other specified disorders of bone density and structure, other site: Secondary | ICD-10-CM

## 2022-01-22 DIAGNOSIS — M810 Age-related osteoporosis without current pathological fracture: Secondary | ICD-10-CM

## 2022-01-31 ENCOUNTER — Encounter: Payer: Self-pay | Admitting: Dermatology

## 2022-01-31 NOTE — Progress Notes (Unsigned)
   Follow-Up Visit   Subjective  Melissa [name] is a 71 y.o. female who presents for the following: New Patient (Initial Visit) (Patient here today for left thumb nail discoloration, per patient she hit her thumb about 3 months ago and the nail came off. Patient states that she want's to make sure she doesn't have any fungus or infection in the nail. Per patient no pain, no treatment. ).  Trauma to the thumbnail produce nail loss, now there is discoloration under nail. Location:  Duration:  Quality:  Associated Signs/Symptoms: Modifying Factors:  Severity:  Timing: Context:   Objective  Well appearing patient in no apparent distress; mood and affect are within normal limits. Left Thumb Nail Plate There is a greenish discoloration of the deep nail plate and/or bed with some onycholysis.  No nail fold changes.  No changes feet.  I agree with patient's concern that this may have been trauma with secondary infection, perhaps more likely Pseudomonas than fungus.  KOH negative.  Culture obtained.    A focused examination was performed including hands, feet, nails. Relevant physical exam findings are noted in the Assessment and Plan.   Assessment & Plan    Tinea Left Thumb Nail Plate  Hold on intervention pending culture.  POCT Skin KOH - Left Thumb Nail Plate  Culture, fungus without smear - Left Thumb Nail Plate      I, Lavonna Monarch, MD, have reviewed all documentation for this visit.  The documentation on 01/31/22 for the exam, diagnosis, procedures, and orders are all accurate and complete.

## 2022-02-01 LAB — POCT SKIN KOH: Skin KOH, POC: NEGATIVE

## 2022-02-02 ENCOUNTER — Other Ambulatory Visit (HOSPITAL_COMMUNITY): Payer: Self-pay

## 2022-02-02 DIAGNOSIS — N898 Other specified noninflammatory disorders of vagina: Secondary | ICD-10-CM | POA: Diagnosis not present

## 2022-02-02 DIAGNOSIS — Z01419 Encounter for gynecological examination (general) (routine) without abnormal findings: Secondary | ICD-10-CM | POA: Diagnosis not present

## 2022-02-02 DIAGNOSIS — Z1211 Encounter for screening for malignant neoplasm of colon: Secondary | ICD-10-CM | POA: Diagnosis not present

## 2022-02-02 DIAGNOSIS — C50919 Malignant neoplasm of unspecified site of unspecified female breast: Secondary | ICD-10-CM | POA: Diagnosis not present

## 2022-02-02 DIAGNOSIS — Z6835 Body mass index (BMI) 35.0-35.9, adult: Secondary | ICD-10-CM | POA: Diagnosis not present

## 2022-02-02 MED ORDER — ESTRADIOL 10 MCG VA TABS
ORAL_TABLET | VAGINAL | 4 refills | Status: DC
  Filled 2022-02-02: qty 8, 28d supply, fill #0
  Filled 2022-03-22: qty 8, 28d supply, fill #1
  Filled 2022-08-19: qty 8, 28d supply, fill #2
  Filled 2022-10-26: qty 8, 28d supply, fill #3
  Filled 2022-12-28: qty 8, 28d supply, fill #4

## 2022-02-03 ENCOUNTER — Other Ambulatory Visit (HOSPITAL_COMMUNITY): Payer: Self-pay

## 2022-02-03 DIAGNOSIS — E7800 Pure hypercholesterolemia, unspecified: Secondary | ICD-10-CM | POA: Diagnosis not present

## 2022-02-03 DIAGNOSIS — R7303 Prediabetes: Secondary | ICD-10-CM | POA: Diagnosis not present

## 2022-02-03 DIAGNOSIS — E669 Obesity, unspecified: Secondary | ICD-10-CM | POA: Diagnosis not present

## 2022-02-03 MED ORDER — MOUNJARO 7.5 MG/0.5ML ~~LOC~~ SOAJ
SUBCUTANEOUS | 1 refills | Status: DC
Start: 2022-02-03 — End: 2022-04-13
  Filled 2022-02-22: qty 2, 28d supply, fill #0

## 2022-02-05 ENCOUNTER — Other Ambulatory Visit (HOSPITAL_COMMUNITY): Payer: Self-pay

## 2022-02-08 ENCOUNTER — Other Ambulatory Visit (HOSPITAL_COMMUNITY): Payer: Self-pay

## 2022-02-08 LAB — CULTURE, FUNGUS WITHOUT SMEAR
MICRO NUMBER:: 13530171
SPECIMEN QUALITY:: ADEQUATE

## 2022-02-17 ENCOUNTER — Other Ambulatory Visit (HOSPITAL_COMMUNITY): Payer: Self-pay

## 2022-02-18 ENCOUNTER — Other Ambulatory Visit (HOSPITAL_COMMUNITY): Payer: Self-pay

## 2022-02-19 ENCOUNTER — Other Ambulatory Visit (HOSPITAL_COMMUNITY): Payer: Self-pay

## 2022-02-22 ENCOUNTER — Other Ambulatory Visit (HOSPITAL_COMMUNITY): Payer: Self-pay

## 2022-02-25 DIAGNOSIS — Z96651 Presence of right artificial knee joint: Secondary | ICD-10-CM | POA: Diagnosis not present

## 2022-03-03 ENCOUNTER — Encounter (INDEPENDENT_AMBULATORY_CARE_PROVIDER_SITE_OTHER): Payer: Self-pay

## 2022-03-04 ENCOUNTER — Ambulatory Visit
Admission: RE | Admit: 2022-03-04 | Discharge: 2022-03-04 | Disposition: A | Discharge status: Home / Self Care | Payer: 59 | Source: Ambulatory Visit | Attending: Internal Medicine | Admitting: Internal Medicine

## 2022-03-04 DIAGNOSIS — Z1231 Encounter for screening mammogram for malignant neoplasm of breast: Secondary | ICD-10-CM

## 2022-03-08 ENCOUNTER — Other Ambulatory Visit (HOSPITAL_COMMUNITY): Payer: Self-pay

## 2022-03-08 DIAGNOSIS — E669 Obesity, unspecified: Secondary | ICD-10-CM | POA: Diagnosis not present

## 2022-03-08 DIAGNOSIS — E7800 Pure hypercholesterolemia, unspecified: Secondary | ICD-10-CM | POA: Diagnosis not present

## 2022-03-08 DIAGNOSIS — Z6831 Body mass index (BMI) 31.0-31.9, adult: Secondary | ICD-10-CM | POA: Diagnosis not present

## 2022-03-08 DIAGNOSIS — R7303 Prediabetes: Secondary | ICD-10-CM | POA: Diagnosis not present

## 2022-03-08 DIAGNOSIS — Z9189 Other specified personal risk factors, not elsewhere classified: Secondary | ICD-10-CM | POA: Diagnosis not present

## 2022-03-08 MED ORDER — WEGOVY 1 MG/0.5ML ~~LOC~~ SOAJ
SUBCUTANEOUS | 1 refills | Status: DC
  Filled 2022-03-08: qty 2, 28d supply, fill #0

## 2022-03-08 MED ORDER — WEGOVY 1.7 MG/0.75ML ~~LOC~~ SOAJ
SUBCUTANEOUS | 1 refills | Status: DC
  Filled 2022-04-14: qty 3, 28d supply, fill #0
  Filled 2022-05-08: qty 3, 28d supply, fill #1

## 2022-03-12 ENCOUNTER — Other Ambulatory Visit (HOSPITAL_COMMUNITY): Payer: Self-pay

## 2022-03-22 ENCOUNTER — Other Ambulatory Visit (HOSPITAL_COMMUNITY): Payer: Self-pay

## 2022-04-07 DIAGNOSIS — E119 Type 2 diabetes mellitus without complications: Secondary | ICD-10-CM | POA: Diagnosis not present

## 2022-04-07 DIAGNOSIS — R632 Polyphagia: Secondary | ICD-10-CM | POA: Diagnosis not present

## 2022-04-07 DIAGNOSIS — R7303 Prediabetes: Secondary | ICD-10-CM | POA: Diagnosis not present

## 2022-04-07 DIAGNOSIS — D649 Anemia, unspecified: Secondary | ICD-10-CM | POA: Diagnosis not present

## 2022-04-07 DIAGNOSIS — Z6832 Body mass index (BMI) 32.0-32.9, adult: Secondary | ICD-10-CM | POA: Diagnosis not present

## 2022-04-07 DIAGNOSIS — E669 Obesity, unspecified: Secondary | ICD-10-CM | POA: Diagnosis not present

## 2022-04-13 ENCOUNTER — Inpatient Hospital Stay: Payer: 59 | Attending: Oncology | Admitting: Oncology

## 2022-04-13 VITALS — BP 109/75 | HR 69 | Temp 98.1°F | Resp 20 | Ht 65.0 in | Wt 192.0 lb

## 2022-04-13 DIAGNOSIS — Z923 Personal history of irradiation: Secondary | ICD-10-CM | POA: Diagnosis not present

## 2022-04-13 DIAGNOSIS — C50912 Malignant neoplasm of unspecified site of left female breast: Secondary | ICD-10-CM | POA: Diagnosis not present

## 2022-04-13 DIAGNOSIS — Z853 Personal history of malignant neoplasm of breast: Secondary | ICD-10-CM | POA: Diagnosis not present

## 2022-04-13 NOTE — Progress Notes (Signed)
[facility] OFFICE PROGRESS NOTE   Diagnosis: Breast cancer  INTERVAL HISTORY:   Melissa [name] returns as scheduled.  She continues letrozole.  No hot flashes or arthralgias.  She underwent right knee replacement surgery in January.  She reports significant improvement in knee pain.  She is now followed in a wellness clinic for weight loss.  No change over either breast.  A bilateral mammogram 03/04/2022 was negative. Objective:  Vital signs in last 24 hours:  Blood pressure 109/75, pulse 69, temperature 98.1 F (36.7 C), temperature source Oral, resp. rate 20, height '5\' 5"'  (1.651 m), weight 192 lb (87.1 kg), SpO2 100 %.    HEENT: Neck without mass Lymphatics: No cervical, supraclavicular, axillary, or inguinal nodes Resp: Lungs clear bilaterally Cardio: Regular rate and rhythm GI: No hepatosplenomegaly Vascular: No leg edema Breast: No mass in either breast.  Status post left lumpectomy.  No evidence for local tumor recurrence.  The left axilla appears benign.   Lab Results:  Lab Results  Component Value Date   WBC 8.6 08/25/2021   HGB 11.6 (L) 08/25/2021   HCT 36.4 08/25/2021   MCV 85.6 08/25/2021   PLT 197 08/25/2021   NEUTROABS 2.2 12/09/2020    CMP  Lab Results  Component Value Date   NA 134 (L) 08/25/2021   K 4.2 08/25/2021   CL 104 08/25/2021   CO2 23 08/25/2021   GLUCOSE 129 (H) 08/25/2021   BUN 12 08/25/2021   CREATININE 0.70 08/25/2021   CALCIUM 8.7 (L) 08/25/2021   PROT 7.4 08/11/2021   ALBUMIN 4.3 08/11/2021   AST 22 08/11/2021   ALT 20 08/11/2021   ALKPHOS 82 08/11/2021   BILITOT 1.0 08/11/2021   GFRNONAA >60 08/25/2021   GFRAA 59 (L) 04/03/2019    Medications: I have reviewed the patient's current medications.   Assessment/Plan: Stage I (T1 N0) left-sided breast cancer diagnosed in January of 1999, ER positive, PR positive, HER-2 negative, status post a left lumpectomy, left axillary lymph node dissection, and left breast  radiation. She completed 5 years of adjuvant tamoxifen therapy in January of 2004.   2. Recurrent invasive breast cancer near the left lumpectomy scar-confirmed on a needle core biopsy 01/05/2012, the pathology is consistent with invasive breast cancer, ER positive, PR positive, HER-2 negative. ? Local recurrence versus a new breast primary   -breast MRI 01/17/2012 confirmed an isolated mass in the upper outer left breast   -bone scan on 01/19/2012-negative aside from an area of very subtle uptake in the inferior sternum without a CT correlate   -staging CTs of the chest, abdomen, and pelvis on 01/19/2012-negative for metastatic disease   -Partial mastectomy 02/14/2012 confirmed a 1.5 cm grade 2 invasive carcinoma with associated DCIS and negative surgical margins   -Oncotype recurrence score-24   -Initiation of Femara after an office visit on 03/09/2012   3. Tiny cutaneous nodular lesion overlying the left clavicle when she was here on 03/09/2012  4. Knee Arthralgias-most likely related to degenerative arthritis-status post left total knee arthroplasty 07/10/2018 5. Pea-sized  nodular lesion near the left axillary scar-not palpated today      Disposition: Melissa [name] remains in clinical remission from breast cancer.  She has been maintained on letrozole for 10 years after undergoing a left lumpectomy in July 2013.  It is unclear whether the 2013 breast cancer was a new primary or a local recurrence of the breast cancer diagnosed in 1999.  Melissa [name] would like to discontinue letrozole.  She feels this is contributing to "brittle "hair and nails.  She is otherwise tolerating letrozole well.  I plan to discuss the case with my breast cancer oncology colleagues.  We will be in touch with her within the next few days to decide on continuing letrozole.  Melissa [name] would like to continue follow-up at the Cancer center.  She will return for an office visit in 1 year.  She will continue yearly  mammography.  Betsy Coder, MD  04/13/2022  10:27 AM  I had further discussion with Melissa [name] by telephone on 04/14/2022.  I discussed the case with my breast cancer oncology colleague.  Melissa [name] is now 10 years out from the diagnosis of a left-sided breast cancer.  It is unclear whether the 2013 tumor represented a local recurrence of the previous left-sided breast cancer versus a new tumor.  I favor a new primary given the interval from the original diagnosis and associated DCIS.  She has completed 10 years of adjuvant aromatase inhibitor therapy.  She prefers discontinuing aromatase inhibitor therapy.  I think this is a good decision.  She will continue yearly mammography and follow-up at the Cancer center.

## 2022-04-14 ENCOUNTER — Other Ambulatory Visit (HOSPITAL_COMMUNITY): Payer: Self-pay

## 2022-04-15 ENCOUNTER — Other Ambulatory Visit (HOSPITAL_COMMUNITY): Payer: Self-pay

## 2022-05-08 ENCOUNTER — Other Ambulatory Visit (HOSPITAL_COMMUNITY): Payer: Self-pay

## 2022-05-10 ENCOUNTER — Other Ambulatory Visit (HOSPITAL_COMMUNITY): Payer: Self-pay

## 2022-05-11 DIAGNOSIS — E669 Obesity, unspecified: Secondary | ICD-10-CM | POA: Diagnosis not present

## 2022-05-11 DIAGNOSIS — Z6831 Body mass index (BMI) 31.0-31.9, adult: Secondary | ICD-10-CM | POA: Diagnosis not present

## 2022-05-11 DIAGNOSIS — R632 Polyphagia: Secondary | ICD-10-CM | POA: Diagnosis not present

## 2022-05-11 DIAGNOSIS — R7303 Prediabetes: Secondary | ICD-10-CM | POA: Diagnosis not present

## 2022-06-07 ENCOUNTER — Other Ambulatory Visit (HOSPITAL_COMMUNITY): Payer: Self-pay

## 2022-06-22 DIAGNOSIS — E7800 Pure hypercholesterolemia, unspecified: Secondary | ICD-10-CM | POA: Diagnosis not present

## 2022-06-22 DIAGNOSIS — R632 Polyphagia: Secondary | ICD-10-CM | POA: Diagnosis not present

## 2022-06-22 DIAGNOSIS — E669 Obesity, unspecified: Secondary | ICD-10-CM | POA: Diagnosis not present

## 2022-06-22 DIAGNOSIS — Z6831 Body mass index (BMI) 31.0-31.9, adult: Secondary | ICD-10-CM | POA: Diagnosis not present

## 2022-06-22 DIAGNOSIS — R7303 Prediabetes: Secondary | ICD-10-CM | POA: Diagnosis not present

## 2022-07-05 ENCOUNTER — Other Ambulatory Visit (HOSPITAL_COMMUNITY): Payer: Self-pay

## 2022-07-07 ENCOUNTER — Ambulatory Visit
Admission: RE | Admit: 2022-07-07 | Discharge: 2022-07-07 | Disposition: A | Discharge status: Home / Self Care | Payer: 59 | Source: Ambulatory Visit | Attending: Internal Medicine | Admitting: Internal Medicine

## 2022-07-07 DIAGNOSIS — M8589 Other specified disorders of bone density and structure, multiple sites: Secondary | ICD-10-CM | POA: Diagnosis not present

## 2022-07-07 DIAGNOSIS — Z780 Asymptomatic menopausal state: Secondary | ICD-10-CM | POA: Diagnosis not present

## 2022-07-07 DIAGNOSIS — M810 Age-related osteoporosis without current pathological fracture: Secondary | ICD-10-CM

## 2022-07-13 DIAGNOSIS — M8589 Other specified disorders of bone density and structure, multiple sites: Secondary | ICD-10-CM | POA: Diagnosis not present

## 2022-08-10 ENCOUNTER — Other Ambulatory Visit (HOSPITAL_COMMUNITY): Payer: Self-pay

## 2022-08-19 ENCOUNTER — Other Ambulatory Visit (HOSPITAL_COMMUNITY): Payer: Self-pay

## 2022-08-19 ENCOUNTER — Other Ambulatory Visit: Payer: Self-pay

## 2022-08-19 DIAGNOSIS — R058 Other specified cough: Secondary | ICD-10-CM | POA: Diagnosis not present

## 2022-08-20 ENCOUNTER — Other Ambulatory Visit (HOSPITAL_COMMUNITY): Payer: Self-pay

## 2022-09-01 ENCOUNTER — Other Ambulatory Visit (HOSPITAL_COMMUNITY): Payer: Self-pay

## 2022-09-01 DIAGNOSIS — R7303 Prediabetes: Secondary | ICD-10-CM | POA: Diagnosis not present

## 2022-09-01 DIAGNOSIS — E669 Obesity, unspecified: Secondary | ICD-10-CM | POA: Diagnosis not present

## 2022-09-01 DIAGNOSIS — Z6831 Body mass index (BMI) 31.0-31.9, adult: Secondary | ICD-10-CM | POA: Diagnosis not present

## 2022-09-01 DIAGNOSIS — E7800 Pure hypercholesterolemia, unspecified: Secondary | ICD-10-CM | POA: Diagnosis not present

## 2022-09-01 DIAGNOSIS — R632 Polyphagia: Secondary | ICD-10-CM | POA: Diagnosis not present

## 2022-09-01 MED ORDER — ZEPBOUND 2.5 MG/0.5ML ~~LOC~~ SOAJ
2.5000 mg | SUBCUTANEOUS | 0 refills | Status: DC
  Filled 2022-09-01 – 2022-12-28 (×2): qty 2, 28d supply, fill #0

## 2022-09-02 ENCOUNTER — Other Ambulatory Visit (HOSPITAL_COMMUNITY): Payer: Self-pay

## 2022-09-29 ENCOUNTER — Other Ambulatory Visit (HOSPITAL_COMMUNITY): Payer: Self-pay

## 2022-09-30 ENCOUNTER — Other Ambulatory Visit (HOSPITAL_COMMUNITY): Payer: Self-pay

## 2022-09-30 DIAGNOSIS — R632 Polyphagia: Secondary | ICD-10-CM | POA: Diagnosis not present

## 2022-09-30 DIAGNOSIS — E7800 Pure hypercholesterolemia, unspecified: Secondary | ICD-10-CM | POA: Diagnosis not present

## 2022-09-30 DIAGNOSIS — R7303 Prediabetes: Secondary | ICD-10-CM | POA: Diagnosis not present

## 2022-09-30 DIAGNOSIS — E669 Obesity, unspecified: Secondary | ICD-10-CM | POA: Diagnosis not present

## 2022-09-30 DIAGNOSIS — Z6831 Body mass index (BMI) 31.0-31.9, adult: Secondary | ICD-10-CM | POA: Diagnosis not present

## 2022-09-30 MED ORDER — ZEPBOUND 2.5 MG/0.5ML ~~LOC~~ SOAJ
2.5000 mg | SUBCUTANEOUS | 1 refills | Status: DC
  Filled 2022-09-30: qty 2, 28d supply, fill #0

## 2022-10-07 DIAGNOSIS — Z96652 Presence of left artificial knee joint: Secondary | ICD-10-CM | POA: Diagnosis not present

## 2022-10-07 DIAGNOSIS — Z96651 Presence of right artificial knee joint: Secondary | ICD-10-CM | POA: Diagnosis not present

## 2022-10-26 ENCOUNTER — Other Ambulatory Visit: Payer: Self-pay

## 2022-10-27 ENCOUNTER — Other Ambulatory Visit (HOSPITAL_COMMUNITY): Payer: Self-pay

## 2022-11-09 ENCOUNTER — Other Ambulatory Visit (HOSPITAL_COMMUNITY): Payer: Self-pay

## 2022-11-09 DIAGNOSIS — E7800 Pure hypercholesterolemia, unspecified: Secondary | ICD-10-CM | POA: Diagnosis not present

## 2022-11-09 DIAGNOSIS — R7303 Prediabetes: Secondary | ICD-10-CM | POA: Diagnosis not present

## 2022-11-09 DIAGNOSIS — Z6831 Body mass index (BMI) 31.0-31.9, adult: Secondary | ICD-10-CM | POA: Diagnosis not present

## 2022-11-09 DIAGNOSIS — E669 Obesity, unspecified: Secondary | ICD-10-CM | POA: Diagnosis not present

## 2022-11-09 DIAGNOSIS — R632 Polyphagia: Secondary | ICD-10-CM | POA: Diagnosis not present

## 2022-11-09 MED ORDER — ZEPBOUND 2.5 MG/0.5ML ~~LOC~~ SOAJ
2.5000 mg | SUBCUTANEOUS | 1 refills | Status: DC
Start: 2022-11-09 — End: 2023-05-16
  Filled 2022-11-09: qty 2, 28d supply, fill #0
  Filled 2022-11-25 – 2022-11-30 (×2): qty 2, 28d supply, fill #1

## 2022-11-25 ENCOUNTER — Other Ambulatory Visit (HOSPITAL_COMMUNITY): Payer: Self-pay

## 2022-11-25 MED ORDER — CEPHALEXIN 250 MG PO CAPS
250.0000 mg | ORAL_CAPSULE | ORAL | 0 refills | Status: DC
  Filled 2022-11-25: qty 40, 90d supply, fill #0

## 2022-11-26 ENCOUNTER — Other Ambulatory Visit (HOSPITAL_COMMUNITY): Payer: Self-pay

## 2022-11-30 ENCOUNTER — Other Ambulatory Visit (HOSPITAL_COMMUNITY): Payer: Self-pay

## 2022-12-01 ENCOUNTER — Other Ambulatory Visit (HOSPITAL_BASED_OUTPATIENT_CLINIC_OR_DEPARTMENT_OTHER): Payer: Self-pay | Admitting: Cardiovascular Disease

## 2022-12-02 ENCOUNTER — Other Ambulatory Visit (HOSPITAL_COMMUNITY): Payer: Self-pay

## 2022-12-02 MED ORDER — ROSUVASTATIN CALCIUM 10 MG PO TABS
10.0000 mg | ORAL_TABLET | Freq: Every day | ORAL | 0 refills | Status: DC
  Filled 2022-12-02: qty 30, 30d supply, fill #0

## 2022-12-08 ENCOUNTER — Other Ambulatory Visit (HOSPITAL_COMMUNITY): Payer: Self-pay

## 2022-12-08 ENCOUNTER — Other Ambulatory Visit: Payer: Self-pay

## 2022-12-08 DIAGNOSIS — R632 Polyphagia: Secondary | ICD-10-CM | POA: Diagnosis not present

## 2022-12-08 DIAGNOSIS — E669 Obesity, unspecified: Secondary | ICD-10-CM | POA: Diagnosis not present

## 2022-12-08 DIAGNOSIS — E7800 Pure hypercholesterolemia, unspecified: Secondary | ICD-10-CM | POA: Diagnosis not present

## 2022-12-08 DIAGNOSIS — Z6831 Body mass index (BMI) 31.0-31.9, adult: Secondary | ICD-10-CM | POA: Diagnosis not present

## 2022-12-08 DIAGNOSIS — R7303 Prediabetes: Secondary | ICD-10-CM | POA: Diagnosis not present

## 2022-12-08 MED ORDER — ZEPBOUND 5 MG/0.5ML ~~LOC~~ SOAJ
5.0000 mg | SUBCUTANEOUS | 0 refills | Status: DC
  Filled 2022-12-08 – 2022-12-29 (×3): qty 2, 28d supply, fill #0

## 2022-12-17 ENCOUNTER — Other Ambulatory Visit (HOSPITAL_COMMUNITY): Payer: Self-pay

## 2022-12-22 ENCOUNTER — Other Ambulatory Visit (HOSPITAL_COMMUNITY): Payer: Self-pay

## 2022-12-28 ENCOUNTER — Other Ambulatory Visit: Payer: Self-pay

## 2022-12-28 ENCOUNTER — Other Ambulatory Visit (HOSPITAL_BASED_OUTPATIENT_CLINIC_OR_DEPARTMENT_OTHER): Payer: Self-pay | Admitting: Cardiovascular Disease

## 2022-12-28 ENCOUNTER — Other Ambulatory Visit (HOSPITAL_COMMUNITY): Payer: Self-pay

## 2022-12-28 NOTE — Telephone Encounter (Signed)
Please call pt to schedule overdue 1 yr follow-up appointment with Dr. Hillsdale or APP for refills. Last OV 10/2021. Thank you! 

## 2022-12-29 ENCOUNTER — Other Ambulatory Visit (HOSPITAL_COMMUNITY): Payer: Self-pay

## 2022-12-30 ENCOUNTER — Other Ambulatory Visit (HOSPITAL_COMMUNITY): Payer: Self-pay

## 2022-12-30 ENCOUNTER — Other Ambulatory Visit: Payer: Self-pay

## 2022-12-30 NOTE — Telephone Encounter (Signed)
Left message for patient to call and schedule a follow up with Dr. Duke Salvia / APP for medication refills

## 2023-01-03 ENCOUNTER — Other Ambulatory Visit (HOSPITAL_COMMUNITY): Payer: Self-pay

## 2023-01-03 MED ORDER — ROSUVASTATIN CALCIUM 10 MG PO TABS
10.0000 mg | ORAL_TABLET | Freq: Every day | ORAL | 3 refills | Status: DC
  Filled 2023-01-03: qty 30, 30d supply, fill #0
  Filled 2023-02-09: qty 30, 30d supply, fill #1
  Filled 2023-03-10: qty 30, 30d supply, fill #2
  Filled 2023-04-04: qty 30, 30d supply, fill #3

## 2023-01-03 NOTE — Telephone Encounter (Signed)
Scheduled 05/16/23 with Dr. Duke Salvia

## 2023-01-03 NOTE — Telephone Encounter (Signed)
Rx request sent to pharmacy.  

## 2023-01-04 ENCOUNTER — Other Ambulatory Visit (HOSPITAL_COMMUNITY): Payer: Self-pay

## 2023-01-19 ENCOUNTER — Other Ambulatory Visit: Payer: Self-pay | Admitting: Internal Medicine

## 2023-01-19 DIAGNOSIS — Z1231 Encounter for screening mammogram for malignant neoplasm of breast: Secondary | ICD-10-CM

## 2023-01-24 DIAGNOSIS — N941 Unspecified dyspareunia: Secondary | ICD-10-CM | POA: Diagnosis not present

## 2023-01-24 DIAGNOSIS — R7303 Prediabetes: Secondary | ICD-10-CM | POA: Diagnosis not present

## 2023-01-24 DIAGNOSIS — Z79899 Other long term (current) drug therapy: Secondary | ICD-10-CM | POA: Diagnosis not present

## 2023-01-24 DIAGNOSIS — N390 Urinary tract infection, site not specified: Secondary | ICD-10-CM | POA: Diagnosis not present

## 2023-01-24 DIAGNOSIS — E559 Vitamin D deficiency, unspecified: Secondary | ICD-10-CM | POA: Diagnosis not present

## 2023-01-24 DIAGNOSIS — E7800 Pure hypercholesterolemia, unspecified: Secondary | ICD-10-CM | POA: Diagnosis not present

## 2023-01-24 DIAGNOSIS — Z0000 Encounter for general adult medical examination without abnormal findings: Secondary | ICD-10-CM | POA: Diagnosis not present

## 2023-01-24 DIAGNOSIS — I2510 Atherosclerotic heart disease of native coronary artery without angina pectoris: Secondary | ICD-10-CM | POA: Diagnosis not present

## 2023-01-24 DIAGNOSIS — M8589 Other specified disorders of bone density and structure, multiple sites: Secondary | ICD-10-CM | POA: Diagnosis not present

## 2023-01-24 DIAGNOSIS — Z6831 Body mass index (BMI) 31.0-31.9, adult: Secondary | ICD-10-CM | POA: Diagnosis not present

## 2023-01-25 ENCOUNTER — Other Ambulatory Visit (HOSPITAL_COMMUNITY): Payer: Self-pay

## 2023-01-25 DIAGNOSIS — R7303 Prediabetes: Secondary | ICD-10-CM | POA: Diagnosis not present

## 2023-01-25 DIAGNOSIS — E669 Obesity, unspecified: Secondary | ICD-10-CM | POA: Diagnosis not present

## 2023-01-25 DIAGNOSIS — Z6831 Body mass index (BMI) 31.0-31.9, adult: Secondary | ICD-10-CM | POA: Diagnosis not present

## 2023-01-25 DIAGNOSIS — R632 Polyphagia: Secondary | ICD-10-CM | POA: Diagnosis not present

## 2023-01-25 DIAGNOSIS — E7800 Pure hypercholesterolemia, unspecified: Secondary | ICD-10-CM | POA: Diagnosis not present

## 2023-01-25 MED ORDER — ZEPBOUND 5 MG/0.5ML ~~LOC~~ SOAJ
5.0000 mg | SUBCUTANEOUS | 0 refills | Status: DC
  Filled 2023-01-25 – 2023-01-28 (×2): qty 2, 28d supply, fill #0

## 2023-01-28 ENCOUNTER — Other Ambulatory Visit (HOSPITAL_COMMUNITY): Payer: Self-pay

## 2023-01-28 MED ORDER — NITROFURANTOIN MONOHYD MACRO 100 MG PO CAPS
100.0000 mg | ORAL_CAPSULE | Freq: Two times a day (BID) | ORAL | 0 refills | Status: DC
  Filled 2023-01-28: qty 10, 5d supply, fill #0

## 2023-02-02 ENCOUNTER — Other Ambulatory Visit (HOSPITAL_COMMUNITY): Payer: Self-pay

## 2023-02-02 DIAGNOSIS — N390 Urinary tract infection, site not specified: Secondary | ICD-10-CM | POA: Diagnosis not present

## 2023-02-28 ENCOUNTER — Other Ambulatory Visit (HOSPITAL_COMMUNITY): Payer: Self-pay

## 2023-02-28 DIAGNOSIS — N958 Other specified menopausal and perimenopausal disorders: Secondary | ICD-10-CM | POA: Diagnosis not present

## 2023-02-28 DIAGNOSIS — Z139 Encounter for screening, unspecified: Secondary | ICD-10-CM | POA: Diagnosis not present

## 2023-02-28 DIAGNOSIS — R7303 Prediabetes: Secondary | ICD-10-CM | POA: Diagnosis not present

## 2023-02-28 DIAGNOSIS — Z01419 Encounter for gynecological examination (general) (routine) without abnormal findings: Secondary | ICD-10-CM | POA: Diagnosis not present

## 2023-02-28 DIAGNOSIS — R632 Polyphagia: Secondary | ICD-10-CM | POA: Diagnosis not present

## 2023-02-28 DIAGNOSIS — Z1211 Encounter for screening for malignant neoplasm of colon: Secondary | ICD-10-CM | POA: Diagnosis not present

## 2023-02-28 DIAGNOSIS — E669 Obesity, unspecified: Secondary | ICD-10-CM | POA: Diagnosis not present

## 2023-02-28 DIAGNOSIS — C50919 Malignant neoplasm of unspecified site of unspecified female breast: Secondary | ICD-10-CM | POA: Diagnosis not present

## 2023-02-28 DIAGNOSIS — Z6831 Body mass index (BMI) 31.0-31.9, adult: Secondary | ICD-10-CM | POA: Diagnosis not present

## 2023-02-28 DIAGNOSIS — E7800 Pure hypercholesterolemia, unspecified: Secondary | ICD-10-CM | POA: Diagnosis not present

## 2023-02-28 DIAGNOSIS — Z6835 Body mass index (BMI) 35.0-35.9, adult: Secondary | ICD-10-CM | POA: Diagnosis not present

## 2023-02-28 MED ORDER — ESTRADIOL 10 MCG VA TABS
ORAL_TABLET | VAGINAL | 4 refills | Status: DC
  Filled 2023-02-28: qty 24, 84d supply, fill #0
  Filled 2023-09-28: qty 24, 84d supply, fill #1

## 2023-02-28 MED ORDER — ZEPBOUND 7.5 MG/0.5ML ~~LOC~~ SOAJ
7.5000 mg | SUBCUTANEOUS | 1 refills | Status: DC
  Filled 2023-02-28: qty 2, 28d supply, fill #0

## 2023-03-02 ENCOUNTER — Other Ambulatory Visit (HOSPITAL_COMMUNITY): Payer: Self-pay

## 2023-03-02 MED ORDER — CEPHALEXIN 250 MG PO CAPS
ORAL_CAPSULE | ORAL | 0 refills | Status: DC
  Filled 2023-03-02: qty 40, 90d supply, fill #0

## 2023-03-07 ENCOUNTER — Ambulatory Visit: Payer: Commercial Managed Care - PPO

## 2023-03-07 DIAGNOSIS — Z1231 Encounter for screening mammogram for malignant neoplasm of breast: Secondary | ICD-10-CM | POA: Diagnosis not present

## 2023-03-10 ENCOUNTER — Other Ambulatory Visit: Payer: Self-pay

## 2023-03-10 ENCOUNTER — Other Ambulatory Visit (HOSPITAL_COMMUNITY): Payer: Self-pay

## 2023-03-31 ENCOUNTER — Other Ambulatory Visit: Payer: Self-pay

## 2023-03-31 ENCOUNTER — Other Ambulatory Visit (HOSPITAL_COMMUNITY): Payer: Self-pay

## 2023-03-31 DIAGNOSIS — R7303 Prediabetes: Secondary | ICD-10-CM | POA: Diagnosis not present

## 2023-03-31 DIAGNOSIS — E669 Obesity, unspecified: Secondary | ICD-10-CM | POA: Diagnosis not present

## 2023-03-31 DIAGNOSIS — Z6830 Body mass index (BMI) 30.0-30.9, adult: Secondary | ICD-10-CM | POA: Diagnosis not present

## 2023-03-31 DIAGNOSIS — R632 Polyphagia: Secondary | ICD-10-CM | POA: Diagnosis not present

## 2023-03-31 DIAGNOSIS — E7800 Pure hypercholesterolemia, unspecified: Secondary | ICD-10-CM | POA: Diagnosis not present

## 2023-03-31 MED ORDER — ZEPBOUND 7.5 MG/0.5ML ~~LOC~~ SOAJ
7.5000 mg | SUBCUTANEOUS | 1 refills | Status: DC
  Filled 2023-03-31: qty 2, 28d supply, fill #0

## 2023-04-04 ENCOUNTER — Other Ambulatory Visit (HOSPITAL_COMMUNITY): Payer: Self-pay

## 2023-04-14 ENCOUNTER — Inpatient Hospital Stay: Payer: Medicare Other | Admitting: Oncology

## 2023-04-20 DIAGNOSIS — Z23 Encounter for immunization: Secondary | ICD-10-CM | POA: Diagnosis not present

## 2023-05-10 ENCOUNTER — Other Ambulatory Visit (HOSPITAL_COMMUNITY): Payer: Self-pay

## 2023-05-10 DIAGNOSIS — E669 Obesity, unspecified: Secondary | ICD-10-CM | POA: Diagnosis not present

## 2023-05-10 DIAGNOSIS — E7800 Pure hypercholesterolemia, unspecified: Secondary | ICD-10-CM | POA: Diagnosis not present

## 2023-05-10 DIAGNOSIS — R632 Polyphagia: Secondary | ICD-10-CM | POA: Diagnosis not present

## 2023-05-10 DIAGNOSIS — R7303 Prediabetes: Secondary | ICD-10-CM | POA: Diagnosis not present

## 2023-05-10 DIAGNOSIS — Z6830 Body mass index (BMI) 30.0-30.9, adult: Secondary | ICD-10-CM | POA: Diagnosis not present

## 2023-05-10 MED ORDER — ZEPBOUND 7.5 MG/0.5ML ~~LOC~~ SOAJ
7.5000 mg | SUBCUTANEOUS | 1 refills | Status: DC
  Filled 2023-05-10: qty 2, 28d supply, fill #0

## 2023-05-11 ENCOUNTER — Inpatient Hospital Stay: Payer: Medicare Other | Admitting: Oncology

## 2023-05-13 ENCOUNTER — Other Ambulatory Visit (HOSPITAL_BASED_OUTPATIENT_CLINIC_OR_DEPARTMENT_OTHER): Payer: Self-pay | Admitting: Cardiovascular Disease

## 2023-05-13 ENCOUNTER — Other Ambulatory Visit (HOSPITAL_COMMUNITY): Payer: Self-pay

## 2023-05-13 MED ORDER — ROSUVASTATIN CALCIUM 10 MG PO TABS
10.0000 mg | ORAL_TABLET | Freq: Every day | ORAL | 0 refills | Status: DC
  Filled 2023-05-13: qty 30, 30d supply, fill #0

## 2023-05-16 ENCOUNTER — Encounter (HOSPITAL_BASED_OUTPATIENT_CLINIC_OR_DEPARTMENT_OTHER): Payer: Self-pay | Admitting: Cardiovascular Disease

## 2023-05-16 ENCOUNTER — Ambulatory Visit (INDEPENDENT_AMBULATORY_CARE_PROVIDER_SITE_OTHER): Payer: Medicare Other | Admitting: Cardiovascular Disease

## 2023-05-16 VITALS — BP 102/74 | HR 55 | Ht 66.0 in | Wt 184.6 lb

## 2023-05-16 DIAGNOSIS — E7800 Pure hypercholesterolemia, unspecified: Secondary | ICD-10-CM

## 2023-05-16 DIAGNOSIS — I2510 Atherosclerotic heart disease of native coronary artery without angina pectoris: Secondary | ICD-10-CM

## 2023-05-16 DIAGNOSIS — R7303 Prediabetes: Secondary | ICD-10-CM | POA: Insufficient documentation

## 2023-05-16 NOTE — Progress Notes (Signed)
Cardiology Office Note:  .    Date:  05/16/2023  ID:  Melissa [name], DOB 20-Oct-1950, MRN [id number] PCP: Marden Noble, MD   HeartCare Providers Cardiologist:  Chilton Si, MD     History of Present Illness: .    Melissa [name] is a 72 y.o. female with asymptomatic coronary calcification, hyperlipidemia, and recurrent breast cancer status post lumpectomy and XRT who presents for follow up.   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 She has several family members who died of cardiovascular disease and her brother had recently died from a heart attack. Her father had a heart attack in his 3s and several paternal uncles also had heart attacks in their 29s. She denied chest pain but did report exertional dyspnea.  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  Exercise Cardiolite 06/2015 was negative for ischemia.  She achieved 8.5 METS. Dr. Katrinka Blazing was referred for a coronary CT-A 09/2016 that revealed a coronary calcium score of 5.8, which was 59th percentile for age and gender.  She had proximal LAD plaque.  She was started on rosuvastatin.   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 Rosuvastatin was reduced to every other day due to myalgias.  She previously reported occasional episodes of heart fluttering that lasted 10 to 15 seconds.  Given that the episodes were sporadic, an ambulatory monitor was not placed at that time. She underwent total R knee arthroplasty 07/2021. She was started on [facility] which had helped her to lose weight.   At her visit 10/2021, she continued to recover from her knee surgery and was staying active despite exertional fatigue and knee pain/swelling. Due to complaints of indigestion she was planning to stop the [facility] 11/2021. We discussed the importance of continuing with her diet afterwards. She was asked to resume her 81 mg ASA daily.  Today, she appears well and  has been staying active at the gym. She is working with a Systems analyst 2 days a week, and otherwise completes cardiovascular exercises every day. Generally she feels fatigued and short winded with exercise but notes that this is gradually improving. She has noticed that she is able to exercise for longer without becoming short of breath. Additionally, she complains of some lightheadedness if she is moving too quickly in the mornings, such as in the setting of being late and not drinking water in the mornings. As of 01/2023 her LDL was 60. She admits to not doing as well regarding her diet and consuming sweets. Currently her weight Is 184 lbs in the office. Of note, she has been travelling more frequently. Since September she has been off of Humira. Lately she has noticed that her hot flashes have been recurring. She denies any palpitations, chest pain, peripheral edema, headaches, syncope, orthopnea, or PND.  ROS:  Please see the history of present illness. All other systems are reviewed and negative.  (+) Exertional fatigue and shortness of breath (+) Hot flashes (+) Lightheadedness.  Studies Reviewed: .        Risk Assessment/Calculations:             Physical Exam:    VS:  BP 102/74 (BP Location: Right Arm, Patient Position: Sitting, Cuff Size: Normal)   Pulse (!) 55   Ht 5\' 6"  (1.676 m)   Wt 184 lb 9.6 oz (83.7 kg)   SpO2 100%   BMI 29.80 kg/m  ,  BMI Body mass index is 29.8 kg/m. GENERAL:  Well appearing HEENT: Pupils equal round and reactive, fundi not visualized, oral mucosa unremarkable NECK:  No jugular venous distention, waveform within normal limits, carotid upstroke brisk and symmetric, no bruits, no thyromegaly LUNGS:  Clear to auscultation bilaterally HEART:  RRR.  PMI not displaced or sustained,S1 and S2 within normal limits, no S3, no S4, no clicks, no rubs, no murmurs ABD:  Flat, positive bowel sounds normal in frequency in pitch, no bruits, no rebound, no guarding, no  midline pulsatile mass, no hepatomegaly, no splenomegaly EXT:  2 plus pulses throughout, no edema, no cyanosis no clubbing SKIN:  No rashes no nodules NEURO:  Cranial nerves II through XII grossly intact, motor grossly intact throughout PSYCH:  Cognitively intact, oriented to person place and time  Wt Readings from Last 3 Encounters:  05/16/23 184 lb 9.6 oz (83.7 kg)  04/13/22 192 lb (87.1 kg)  11/03/21 198 lb (89.8 kg)     ASSESSMENT AND PLAN: .    # Hyperlipidemia;  # Non-obstructive CAD:  She is doing well and has no ischemic symptoms with exercise.  Lipids are well-controlled.  LDL <70. Continue aspirin and statin.    # Prediabetes A1c of 5.7, struggling with dietary control, especially sweets. Weight stable but patient desires more weight loss. -Encouraged continued exercise and dietary control, focusing on reducing carbohydrate intake. -Continue Mounjaro  # Orthostatic Hypotension Reports of lightheadedness upon rapid standing, likely due to low baseline blood pressure. -Advised to stay hydrated, consider adding a bit of salt to diet, and use compression socks. Recommended gradual transition from lying to standing, especially in the morning.  # Breast Cancer History Off Femara for a year, now experiencing return of hot flashes. Recent mammogram in August 2024 was reportedly normal. -Follow-up with oncologist scheduled for July 07, 2023. Patient on cancellation list for earlier appointment if available.  General Health Maintenance -Continue current medications including Aspirin, Keflex three times a week, Vitamin D, and Rosuvastatin (recently refilled). -Follow-up in one year, or sooner if needed.       Dispo:  FU with Melissa [name] C. Duke Salvia, MD, [facility] in 1 year.  I,Mathew Stumpf,acting as a Neurosurgeon for Chilton Si, MD.,have documented all relevant documentation on the behalf of Chilton Si, MD,as directed by  Chilton Si, MD while in the presence of  Chilton Si, MD.  I, Fahd Galea C. Duke Salvia, MD have reviewed all documentation for this visit.  The documentation of the exam, diagnosis, procedures, and orders on 05/16/2023 are all accurate and complete.   Signed, Chilton Si, MD

## 2023-05-16 NOTE — Patient Instructions (Signed)
Medication Instructions:  Your physician recommends that you continue on your current medications as directed. Please refer to the Current Medication list given to you today.   *If you need a refill on your cardiac medications before your next appointment, please call your pharmacy*  Lab Work: none  Testing/Procedures: none  Follow-Up: At [facility], you and your health needs are our priority.  As part of our continuing mission to provide you with exceptional heart care, we have created designated Provider Care Teams.  These Care Teams include your primary Cardiologist (physician) and Advanced Practice Providers (APPs -  Physician Assistants and Nurse Practitioners) who all work together to provide you with the care you need, when you need it.  We recommend signing up for the patient portal called "MyChart".  Sign up information is provided on this After Visit Summary.  MyChart is used to connect with patients for Virtual Visits (Telemedicine).  Patients are able to view lab/test results, encounter notes, upcoming appointments, etc.  Non-urgent messages can be sent to your provider as well.   To learn more about what you can do with MyChart, go to [email or web address].    Your next appointment:   12 month(s)  Provider:   Chilton Si, MD

## 2023-06-06 ENCOUNTER — Other Ambulatory Visit (HOSPITAL_COMMUNITY): Payer: Self-pay

## 2023-06-06 MED ORDER — CEPHALEXIN 250 MG PO CAPS
ORAL_CAPSULE | ORAL | 3 refills | Status: DC
  Filled 2023-06-06: qty 40, 90d supply, fill #0
  Filled 2023-09-12 (×2): qty 40, 90d supply, fill #1
  Filled 2023-12-16: qty 40, 90d supply, fill #2
  Filled 2024-03-09: qty 40, 90d supply, fill #3

## 2023-06-09 ENCOUNTER — Other Ambulatory Visit (HOSPITAL_COMMUNITY): Payer: Self-pay

## 2023-06-09 DIAGNOSIS — R632 Polyphagia: Secondary | ICD-10-CM | POA: Diagnosis not present

## 2023-06-09 DIAGNOSIS — E7800 Pure hypercholesterolemia, unspecified: Secondary | ICD-10-CM | POA: Diagnosis not present

## 2023-06-09 DIAGNOSIS — R7303 Prediabetes: Secondary | ICD-10-CM | POA: Diagnosis not present

## 2023-06-09 DIAGNOSIS — Z6829 Body mass index (BMI) 29.0-29.9, adult: Secondary | ICD-10-CM | POA: Diagnosis not present

## 2023-06-09 DIAGNOSIS — E663 Overweight: Secondary | ICD-10-CM | POA: Diagnosis not present

## 2023-06-09 DIAGNOSIS — Z8639 Personal history of other endocrine, nutritional and metabolic disease: Secondary | ICD-10-CM | POA: Diagnosis not present

## 2023-06-09 MED ORDER — ZEPBOUND 7.5 MG/0.5ML ~~LOC~~ SOAJ
7.5000 mg | SUBCUTANEOUS | 1 refills | Status: DC
  Filled 2023-06-09 – 2023-06-15 (×2): qty 2, 28d supply, fill #0
  Filled 2023-12-17: qty 2, 28d supply, fill #1

## 2023-06-11 ENCOUNTER — Other Ambulatory Visit (HOSPITAL_COMMUNITY): Payer: Self-pay

## 2023-06-13 ENCOUNTER — Other Ambulatory Visit (HOSPITAL_BASED_OUTPATIENT_CLINIC_OR_DEPARTMENT_OTHER): Payer: Self-pay

## 2023-06-15 ENCOUNTER — Other Ambulatory Visit: Payer: Self-pay

## 2023-06-15 ENCOUNTER — Other Ambulatory Visit (HOSPITAL_COMMUNITY): Payer: Self-pay

## 2023-06-15 ENCOUNTER — Other Ambulatory Visit (HOSPITAL_BASED_OUTPATIENT_CLINIC_OR_DEPARTMENT_OTHER): Payer: Self-pay

## 2023-06-20 ENCOUNTER — Other Ambulatory Visit (HOSPITAL_COMMUNITY): Payer: Self-pay

## 2023-06-20 ENCOUNTER — Other Ambulatory Visit (HOSPITAL_BASED_OUTPATIENT_CLINIC_OR_DEPARTMENT_OTHER): Payer: Self-pay | Admitting: Cardiovascular Disease

## 2023-06-20 MED ORDER — ROSUVASTATIN CALCIUM 10 MG PO TABS
10.0000 mg | ORAL_TABLET | Freq: Every day | ORAL | 2 refills | Status: DC
  Filled 2023-06-20: qty 90, 90d supply, fill #0
  Filled 2023-10-10: qty 90, 90d supply, fill #1
  Filled 2024-01-30: qty 90, 90d supply, fill #2

## 2023-07-07 ENCOUNTER — Inpatient Hospital Stay: Payer: Medicare Other | Attending: Oncology | Admitting: Oncology

## 2023-07-07 VITALS — BP 110/68 | HR 62 | Temp 98.1°F | Resp 18 | Ht 66.0 in | Wt 185.0 lb

## 2023-07-07 DIAGNOSIS — Z9221 Personal history of antineoplastic chemotherapy: Secondary | ICD-10-CM | POA: Diagnosis not present

## 2023-07-07 DIAGNOSIS — Z923 Personal history of irradiation: Secondary | ICD-10-CM | POA: Diagnosis not present

## 2023-07-07 DIAGNOSIS — C50912 Malignant neoplasm of unspecified site of left female breast: Secondary | ICD-10-CM

## 2023-07-07 DIAGNOSIS — N951 Menopausal and female climacteric states: Secondary | ICD-10-CM | POA: Diagnosis not present

## 2023-07-07 DIAGNOSIS — Z853 Personal history of malignant neoplasm of breast: Secondary | ICD-10-CM | POA: Diagnosis not present

## 2023-07-07 NOTE — Progress Notes (Signed)
[facility] OFFICE PROGRESS NOTE   Diagnosis: Breast cancer  INTERVAL HISTORY:   Melissa [name] returns as scheduled.  She discontinued letrozole last year.  No change over either breast.  She had a mammogram in August (we do not have the report).  She generally feels well.  She reports intentional weight loss with tirzepatide.  She is followed at the [facility] wellness clinic.  She did the return of hot flashes beginning in the early summer.  The hot flashes chiefly occur at night and are intermittent.  Objective:  Vital signs in last 24 hours:  Blood pressure 110/68, pulse 62, temperature 98.1 F (36.7 C), temperature source Temporal, resp. rate 18, height 5\' 6"  (1.676 m), weight 185 lb (83.9 kg), SpO2 100%.     Lymphatics: No cervical, supraclavicular, or axillary nodes Resp: Lungs clear bilaterally Cardio: Irregular GI: No hepatosplenomegaly Vascular: No leg edema Breasts: Left lumpectomy.  No evidence for local tumor recurrence.  No mass in either breast.  Both axillae appear benign. Lab Results:  Lab Results  Component Value Date   WBC 8.6 08/25/2021   HGB 11.6 (L) 08/25/2021   HCT 36.4 08/25/2021   MCV 85.6 08/25/2021   PLT 197 08/25/2021   NEUTROABS 2.2 12/09/2020    CMP  Lab Results  Component Value Date   NA 134 (L) 08/25/2021   K 4.2 08/25/2021   CL 104 08/25/2021   CO2 23 08/25/2021   GLUCOSE 129 (H) 08/25/2021   BUN 12 08/25/2021   CREATININE 0.70 08/25/2021   CALCIUM 8.7 (L) 08/25/2021   PROT 7.4 08/11/2021   ALBUMIN 4.3 08/11/2021   AST 22 08/11/2021   ALT 20 08/11/2021   ALKPHOS 82 08/11/2021   BILITOT 1.0 08/11/2021   GFRNONAA >60 08/25/2021   GFRAA 59 (L) 04/03/2019    No results found for: "CEA1", "CEA", "ZOX096", "CA125"  Lab Results  Component Value Date   INR 1.0 08/11/2021   LABPROT 13.0 08/11/2021    Imaging:  No results found.  Medications: I have reviewed the patient's current  medications.   Assessment/Plan: Stage I (T1 N0) left-sided breast cancer diagnosed in January of 1999, ER positive, PR positive, HER-2 negative, status post a left lumpectomy, left axillary lymph node dissection, and left breast radiation. She completed 5 years of adjuvant tamoxifen therapy in January of 2004.   2. Recurrent invasive breast cancer near the left lumpectomy scar-confirmed on a needle core biopsy 01/05/2012, the pathology is consistent with invasive breast cancer, ER positive, PR positive, HER-2 negative. ? Local recurrence versus a new breast primary   -breast MRI 01/17/2012 confirmed an isolated mass in the upper outer left breast   -bone scan on 01/19/2012-negative aside from an area of very subtle uptake in the inferior sternum without a CT correlate   -staging CTs of the chest, abdomen, and pelvis on 01/19/2012-negative for metastatic disease   -Partial mastectomy 02/14/2012 confirmed a 1.5 cm grade 2 invasive carcinoma with associated DCIS and negative surgical margins   -Oncotype recurrence score-24   -Initiation of Femara after an office visit on 03/09/2012   -Femara discontinued September 2023 3. Tiny cutaneous nodular lesion overlying the left clavicle when she was here on 03/09/2012  4. Knee Arthralgias-most likely related to degenerative arthritis-status post left total knee arthroplasty 07/10/2018 5. Pea-sized  nodular lesion near the left axillary scar-not palpated today       Disposition: Melissa [name] remains in clinical remission from breast cancer.  She remains off  of systemic therapy.  She will follow-up with Dr. Estanislado Pandy for management of hot flashes.  We will follow-up on the most recent mammogram report.  She has an irregular heart rate.  This appears to been present in the past.  She will follow-up with Dr. Duke Salvia.  Melissa [name] would like to continue follow-up at the Cancer center.  She will return for an office visit in 1 year.  Thornton Papas,  MD  07/07/2023  11:10 AM

## 2023-08-02 ENCOUNTER — Other Ambulatory Visit (HOSPITAL_COMMUNITY): Payer: Self-pay

## 2023-08-02 DIAGNOSIS — Z6830 Body mass index (BMI) 30.0-30.9, adult: Secondary | ICD-10-CM | POA: Diagnosis not present

## 2023-08-02 DIAGNOSIS — R7303 Prediabetes: Secondary | ICD-10-CM | POA: Diagnosis not present

## 2023-08-02 DIAGNOSIS — E66811 Obesity, class 1: Secondary | ICD-10-CM | POA: Diagnosis not present

## 2023-08-02 DIAGNOSIS — E7800 Pure hypercholesterolemia, unspecified: Secondary | ICD-10-CM | POA: Diagnosis not present

## 2023-08-02 DIAGNOSIS — R632 Polyphagia: Secondary | ICD-10-CM | POA: Diagnosis not present

## 2023-08-02 MED ORDER — ZEPBOUND 7.5 MG/0.5ML ~~LOC~~ SOAJ
7.5000 mg | SUBCUTANEOUS | 1 refills | Status: DC
  Filled 2023-08-02: qty 2, 28d supply, fill #0
  Filled 2023-09-12: qty 2, 28d supply, fill #1

## 2023-08-04 ENCOUNTER — Other Ambulatory Visit (HOSPITAL_COMMUNITY): Payer: Self-pay

## 2023-08-04 ENCOUNTER — Telehealth: Payer: Self-pay

## 2023-08-04 NOTE — Telephone Encounter (Signed)
 Called Dr. Cloretta Ned office at [phone] to request most recent mammogram report.  Left voicemail on Dr. Cloretta Ned Nurse line with our fax number and contact information.

## 2023-08-26 ENCOUNTER — Encounter: Payer: Self-pay | Admitting: Body Imaging

## 2023-09-12 ENCOUNTER — Other Ambulatory Visit (HOSPITAL_COMMUNITY): Payer: Self-pay

## 2023-09-12 DIAGNOSIS — H10413 Chronic giant papillary conjunctivitis, bilateral: Secondary | ICD-10-CM | POA: Diagnosis not present

## 2023-09-12 DIAGNOSIS — H04123 Dry eye syndrome of bilateral lacrimal glands: Secondary | ICD-10-CM | POA: Diagnosis not present

## 2023-09-12 MED ORDER — PREDNISOLONE ACETATE 1 % OP SUSP
1.0000 [drp] | Freq: Three times a day (TID) | OPHTHALMIC | 0 refills | Status: AC
  Filled 2023-09-12: qty 5, 34d supply, fill #0

## 2023-09-13 ENCOUNTER — Other Ambulatory Visit: Payer: Self-pay

## 2023-09-13 ENCOUNTER — Other Ambulatory Visit (HOSPITAL_COMMUNITY): Payer: Self-pay

## 2023-09-13 DIAGNOSIS — R632 Polyphagia: Secondary | ICD-10-CM | POA: Diagnosis not present

## 2023-09-13 DIAGNOSIS — Z6829 Body mass index (BMI) 29.0-29.9, adult: Secondary | ICD-10-CM | POA: Diagnosis not present

## 2023-09-13 DIAGNOSIS — E7800 Pure hypercholesterolemia, unspecified: Secondary | ICD-10-CM | POA: Diagnosis not present

## 2023-09-13 DIAGNOSIS — R7303 Prediabetes: Secondary | ICD-10-CM | POA: Diagnosis not present

## 2023-09-13 DIAGNOSIS — E663 Overweight: Secondary | ICD-10-CM | POA: Diagnosis not present

## 2023-09-13 MED ORDER — ZEPBOUND 7.5 MG/0.5ML ~~LOC~~ SOAJ
7.5000 mg | SUBCUTANEOUS | 1 refills | Status: DC
  Filled 2023-09-13: qty 2, 28d supply, fill #0
  Filled 2024-06-14: qty 2, 28d supply, fill #1

## 2023-09-27 DIAGNOSIS — H10413 Chronic giant papillary conjunctivitis, bilateral: Secondary | ICD-10-CM | POA: Diagnosis not present

## 2023-09-29 ENCOUNTER — Other Ambulatory Visit (HOSPITAL_BASED_OUTPATIENT_CLINIC_OR_DEPARTMENT_OTHER): Payer: Self-pay

## 2023-10-03 ENCOUNTER — Other Ambulatory Visit (HOSPITAL_COMMUNITY): Payer: Self-pay

## 2023-10-25 ENCOUNTER — Other Ambulatory Visit (HOSPITAL_COMMUNITY): Payer: Self-pay

## 2023-10-25 ENCOUNTER — Other Ambulatory Visit: Payer: Self-pay

## 2023-10-25 DIAGNOSIS — E663 Overweight: Secondary | ICD-10-CM | POA: Diagnosis not present

## 2023-10-25 DIAGNOSIS — E7800 Pure hypercholesterolemia, unspecified: Secondary | ICD-10-CM | POA: Diagnosis not present

## 2023-10-25 DIAGNOSIS — R632 Polyphagia: Secondary | ICD-10-CM | POA: Diagnosis not present

## 2023-10-25 DIAGNOSIS — Z6829 Body mass index (BMI) 29.0-29.9, adult: Secondary | ICD-10-CM | POA: Diagnosis not present

## 2023-10-25 DIAGNOSIS — R7303 Prediabetes: Secondary | ICD-10-CM | POA: Diagnosis not present

## 2023-10-25 MED ORDER — ZEPBOUND 7.5 MG/0.5ML ~~LOC~~ SOAJ
7.5000 mg | SUBCUTANEOUS | 1 refills | Status: DC
Start: 2023-10-25 — End: 2024-06-11
  Filled 2023-10-25: qty 2, 28d supply, fill #0
  Filled 2024-05-16: qty 2, 28d supply, fill #1

## 2023-11-04 DIAGNOSIS — H2513 Age-related nuclear cataract, bilateral: Secondary | ICD-10-CM | POA: Diagnosis not present

## 2023-11-04 DIAGNOSIS — H04123 Dry eye syndrome of bilateral lacrimal glands: Secondary | ICD-10-CM | POA: Diagnosis not present

## 2023-12-06 DIAGNOSIS — E663 Overweight: Secondary | ICD-10-CM | POA: Diagnosis not present

## 2023-12-06 DIAGNOSIS — R7303 Prediabetes: Secondary | ICD-10-CM | POA: Diagnosis not present

## 2023-12-06 DIAGNOSIS — Z6829 Body mass index (BMI) 29.0-29.9, adult: Secondary | ICD-10-CM | POA: Diagnosis not present

## 2023-12-06 DIAGNOSIS — R632 Polyphagia: Secondary | ICD-10-CM | POA: Diagnosis not present

## 2023-12-06 DIAGNOSIS — E7800 Pure hypercholesterolemia, unspecified: Secondary | ICD-10-CM | POA: Diagnosis not present

## 2023-12-17 ENCOUNTER — Other Ambulatory Visit (HOSPITAL_COMMUNITY): Payer: Self-pay

## 2023-12-20 ENCOUNTER — Other Ambulatory Visit: Payer: Self-pay

## 2024-01-24 DIAGNOSIS — R632 Polyphagia: Secondary | ICD-10-CM | POA: Diagnosis not present

## 2024-01-24 DIAGNOSIS — Z6829 Body mass index (BMI) 29.0-29.9, adult: Secondary | ICD-10-CM | POA: Diagnosis not present

## 2024-01-24 DIAGNOSIS — E663 Overweight: Secondary | ICD-10-CM | POA: Diagnosis not present

## 2024-01-24 DIAGNOSIS — E7800 Pure hypercholesterolemia, unspecified: Secondary | ICD-10-CM | POA: Diagnosis not present

## 2024-01-24 DIAGNOSIS — R7303 Prediabetes: Secondary | ICD-10-CM | POA: Diagnosis not present

## 2024-01-25 DIAGNOSIS — H9190 Unspecified hearing loss, unspecified ear: Secondary | ICD-10-CM | POA: Diagnosis not present

## 2024-01-25 DIAGNOSIS — Z79899 Other long term (current) drug therapy: Secondary | ICD-10-CM | POA: Diagnosis not present

## 2024-01-25 DIAGNOSIS — M8589 Other specified disorders of bone density and structure, multiple sites: Secondary | ICD-10-CM | POA: Diagnosis not present

## 2024-01-25 DIAGNOSIS — R7303 Prediabetes: Secondary | ICD-10-CM | POA: Diagnosis not present

## 2024-01-25 DIAGNOSIS — E7800 Pure hypercholesterolemia, unspecified: Secondary | ICD-10-CM | POA: Diagnosis not present

## 2024-01-25 DIAGNOSIS — E559 Vitamin D deficiency, unspecified: Secondary | ICD-10-CM | POA: Diagnosis not present

## 2024-01-25 DIAGNOSIS — Z0000 Encounter for general adult medical examination without abnormal findings: Secondary | ICD-10-CM | POA: Diagnosis not present

## 2024-01-25 DIAGNOSIS — N941 Unspecified dyspareunia: Secondary | ICD-10-CM | POA: Diagnosis not present

## 2024-01-25 DIAGNOSIS — I2510 Atherosclerotic heart disease of native coronary artery without angina pectoris: Secondary | ICD-10-CM | POA: Diagnosis not present

## 2024-01-25 DIAGNOSIS — N390 Urinary tract infection, site not specified: Secondary | ICD-10-CM | POA: Diagnosis not present

## 2024-01-25 DIAGNOSIS — M4134 Thoracogenic scoliosis, thoracic region: Secondary | ICD-10-CM | POA: Diagnosis not present

## 2024-02-06 DIAGNOSIS — Z96653 Presence of artificial knee joint, bilateral: Secondary | ICD-10-CM | POA: Diagnosis not present

## 2024-02-06 DIAGNOSIS — M25562 Pain in left knee: Secondary | ICD-10-CM | POA: Diagnosis not present

## 2024-03-07 ENCOUNTER — Other Ambulatory Visit (HOSPITAL_COMMUNITY): Payer: Self-pay

## 2024-03-07 MED ORDER — ESTRADIOL 10 MCG VA TABS
1.0000 | ORAL_TABLET | VAGINAL | 4 refills | Status: AC
  Filled 2024-03-07 (×2): qty 24, 84d supply, fill #0

## 2024-03-08 ENCOUNTER — Other Ambulatory Visit: Payer: Self-pay | Admitting: Obstetrics and Gynecology

## 2024-03-08 DIAGNOSIS — Z1231 Encounter for screening mammogram for malignant neoplasm of breast: Secondary | ICD-10-CM

## 2024-03-12 ENCOUNTER — Encounter (INDEPENDENT_AMBULATORY_CARE_PROVIDER_SITE_OTHER): Payer: Self-pay | Admitting: Physician Assistant

## 2024-03-12 ENCOUNTER — Ambulatory Visit (INDEPENDENT_AMBULATORY_CARE_PROVIDER_SITE_OTHER): Admitting: Audiology

## 2024-03-12 ENCOUNTER — Other Ambulatory Visit (HOSPITAL_COMMUNITY): Payer: Self-pay

## 2024-03-12 ENCOUNTER — Ambulatory Visit (INDEPENDENT_AMBULATORY_CARE_PROVIDER_SITE_OTHER): Admitting: Physician Assistant

## 2024-03-12 VITALS — BP 121/77 | HR 63

## 2024-03-12 DIAGNOSIS — H903 Sensorineural hearing loss, bilateral: Secondary | ICD-10-CM | POA: Diagnosis not present

## 2024-03-12 DIAGNOSIS — H905 Unspecified sensorineural hearing loss: Secondary | ICD-10-CM | POA: Diagnosis not present

## 2024-03-12 NOTE — Progress Notes (Signed)
  [address], [address] [place], [place] [address] [phone]  Audiological Evaluation    Name: Melissa [name]     DOB:   12/10/1950      MRN:   [id number]                                                                                     Service Date: 03/12/2024     Accompanied by: unaccompanied   Patient comes today after Reyes Cohen, PA-C sent a referral for a hearing evaluation due to concerns with hearing loss.   Symptoms Yes Details  Hearing loss  [x]  Difficulty understanding, more with speech in background noise  Tinnitus  [x]  Worse in the right ear, intermittent  Ear pain/ infections/pressure  []    Balance problems  []    Noise exposure history  [x]  Some concerts  Previous ear surgeries  []    Family history of hearing loss  []    Amplification  []    Other  []      Otoscopy: Right ear: Clear external ear canal and notable landmarks visualized on the tympanic membrane. Left ear:  Clear external ear canal and notable landmarks visualized on the tympanic membrane.  Tympanometry: Right ear: Type A- Normal external ear canal volume with normal middle ear pressure and tympanic membrane compliance. Left ear: Type A- Normal external ear canal volume with normal middle ear pressure and tympanic membrane compliance.    Pure tone Audiometry: Right ear- Normal hearing from [phone] Hz, then mild to moderate sensorineural hearing loss from 3000 Hz - 8000 Hz. Left ear-  Normal hearing from [phone] Hz and 4000 Hz, then mild to moderate sensorineural hearing loss at 3000 Hz and from 6000-8000 Hz. Of note, observed  hearing loss notch in both ears. See audiogram for more details.  Speech Audiometry: Right ear- Speech Reception Threshold (SRT) was obtained at 25 dBHL. Left ear-Speech Reception Threshold (SRT) was obtained at 20 dBHL.   Word Recognition Score Tested using NU-6 (recorded) Right ear: 100% was obtained at a presentation level of 65 dBHL with contralateral  masking which is deemed as  excellent. Left ear: 100% was obtained at a presentation level of 65 dBHL with contralateral masking which is deemed as  excellent.   The hearing test results were completed under headphones and re-checked with inserts and results are deemed to be of good reliability. Test technique:  conventional    Recommendations: Follow up with ENT as scheduled for today. Return for a hearing evaluation in 1-2 years, before if concerns with hearing/ tinnitus changes arise or per MD recommendation. Use hearing protection when exposed to loud/damaging sounds.  Consider various tinnitus strategies, including the use of a sound generator, hearing aids, and/or tinnitus retraining therapy.  Consider a communication needs assessment after medical clearance for hearing aids is obtained, pending patient motivation.   Gearldine Looney MARIE LEROUX-MARTINEZ, AUD

## 2024-03-12 NOTE — Progress Notes (Signed)
 Dear Dr. Dwight, Here is my assessment for our mutual patient, Melissa [name]. Thank you for allowing me the opportunity to care for your patient. Please do not hesitate to contact me should you have any other questions. Sincerely, Chyrl Cohen PA-C  Otolaryngology Clinic Note Referring provider: Dr. Dwight HPI:  Melissa [name] is a 73 y.o. female kindly referred by Dr. Dwight   The patient is a 73 year old female seen in our office for evaluation of decreased hearing.  The patient notes a progressive decline in her hearing, she notes is not traumatic.  She feels like she has to have people repeat things more frequently and has more trouble in crowded environments.  She notes that both ears are equal and hearing loss.  She also notes some very minimal tonal tinnitus bilateral.  She denies any preceding trauma to her ear, no history of loud noises.  She wanted hearing evaluation to establish her baseline.  Independent Review of Additional Tests or Records:  [facility] office note for hearing loss on 01/25/2024  Audiological evaluation on 03/12/2024   Otoscopy: Right ear: Clear external ear canal and notable landmarks visualized on the tympanic membrane. Left ear:  Clear external ear canal and notable landmarks visualized on the tympanic membrane.   Tympanometry: Right ear: Type A- Normal external ear canal volume with normal middle ear pressure and tympanic membrane compliance. Left ear: Type A- Normal external ear canal volume with normal middle ear pressure and tympanic membrane compliance.     Pure tone Audiometry: Right ear- Normal hearing from [phone], then mild to moderate sensorineural hearing loss from 3000 Hz - 8000 Hz. Left ear-  Normal hearing from [phone] and 4000 Hz, then mild to moderate sensorineural hearing loss at 3000 Hz and from 6000-8000 Hz. Of note, observed  hearing loss notch in both ears. See audiogram for more details.   Speech Audiometry: Right ear- Speech Reception  Threshold (SRT) was obtained at 25 dBHL. Left ear-Speech Reception Threshold (SRT) was obtained at 20 dBHL.   Word Recognition Score Tested using NU-6 (recorded) Right ear: 100% was obtained at a presentation level of 65 dBHL with contralateral masking which is deemed as  excellent. Left ear: 100% was obtained at a presentation level of 65 dBHL with contralateral masking which is deemed as  excellent.   The hearing test results were completed under headphones and re-checked with inserts and results are deemed to be of good reliability. Test technique:  conventional    PMH/Meds/All/SocHx/FamHx/ROS:   Past Medical History:  Diagnosis Date   Anemia    AR (aortic regurgitation) 06/2015   Trivial, Noted on ECHO   Breast cancer (HCC) 07/23/97   left, tx w/xrt, Tamoxifen x 5 yrs   Chronic pain    left knee   Constipation    Coronary artery calcification 04/10/2020   Mild proximal LAD seen on coronary CT-A in 2018   DDD (degenerative disc disease), lumbar 04/05/2018   xray   Diastolic dysfunction without heart failure 09/01/2015   Grade 1 diastolic dysfunction on echo 06/2015   Diverticulitis    pt unaware   DVT complicating pregnancy    Right calf, surface   Foot pain, bilateral    Gallbladder problem    H/O blood clots    History of bladder infections    History of endometriosis    Hx of radiation therapy 08/22/97 - 10/03/97   left breast   Joint pain    Knee pain    MR (  mitral regurgitation) 06/2015   Noted on ECHO   OA (osteoarthritis)    Bilateral Knees   Obesity    Obesity (BMI 30-39.9) 04/10/2020   Personal history of radiation therapy    Prediabetes    Pure hypercholesterolemia 04/10/2020   Recurrent breast cancer (HCC) 01/04/12   biopsy, ER/PR+, Her 2 -   Shortness of breath 06/26/2015, 09/07/2016   TR (tricuspid regurgitation) 06/2015   Noted on ECHO   Vitamin D deficiency      Past Surgical History:  Procedure Laterality Date   ABDOMINAL HYSTERECTOMY  1987    endometriosos   BREAST BIOPSY Left 01/04/2012   BREAST LUMPECTOMY  02/14/2012   LUMPECTOMY;  Surgeon: Sherlean JINNY Laughter, MD;  Location: [facility];  Service: General;  Laterality: Left;   BREAST SURGERY  1999   lumpectomy-left   CESAREAN SECTION     x 3   CHOLECYSTECTOMY  1993   COLONOSCOPY     HERNIA REPAIR  1977   umbilical    ovary removed  1987   TOTAL KNEE ARTHROPLASTY Left 07/10/2018   Procedure: LEFT TOTAL KNEE ARTHROPLASTY;  Surgeon: Melodi Lerner, MD;  Location: [facility];  Service: Orthopedics;  Laterality: Left;    TOTAL KNEE ARTHROPLASTY Right 08/24/2021   Procedure: TOTAL KNEE ARTHROPLASTY;  Surgeon: Melodi Lerner, MD;  Location: [facility];  Service: Orthopedics;  Laterality: Right;    Family History  Problem Relation Age of Onset   Cancer Mother 71       colon   Hypertension Mother    Obesity Mother    Cancer Maternal Aunt        colon   Breast cancer Maternal Aunt    Cancer Maternal Grandfather        prostate   Cancer Maternal Aunt        colon   Prostate cancer Maternal Uncle    Heart attack Father    Heart disease Father    Sudden death Father    Alcohol abuse Father    Heart attack Brother    Heart attack Paternal Grandfather    Heart failure Sister    Cancer Paternal Aunt        breast     Social Connections: Not on file      Current Outpatient Medications:    aspirin EC (ASPIRIN 81) 81 MG tablet, 1 tablet, Disp: , Rfl:    cephALEXin  (KEFLEX ) 250 MG capsule, TAKE 1 CAPSULE BY MOUTH 3 TIMES A WEEK, Disp: 40 capsule, Rfl: 3   cholecalciferol (VITAMIN D3) 25 MCG (1000 UNIT) tablet, Take 1 tablet, Disp: , Rfl:    Estradiol  (YUVAFEM ) 10 MCG TABS vaginal tablet, Insert 1 tablet vaginally twice a week, Disp: 24 tablet, Rfl: 4   Estradiol  (YUVAFEM ) 10 MCG TABS vaginal tablet, Place 1 tablet (10 mcg total) vaginally 2 (two) times a week., Disp: 24 tablet, Rfl: 4   prednisoLONE  acetate (PRED FORTE ) 1 % ophthalmic suspension, Place 1 drop into both eyes 3  (three) times daily for one week then twice daily in both eyes for one week, Disp: 5 mL, Rfl: 0   rosuvastatin  (CRESTOR ) 10 MG tablet, Take 1 tablet (10 mg) by mouth daily. Please keep your upcoming appointment for refills., Disp: 90 tablet, Rfl: 2   tirzepatide  (ZEPBOUND ) 7.5 MG/0.5ML Pen, Inject 7.5 mg into the skin once a week., Disp: 2 mL, Rfl: 1   tirzepatide  (ZEPBOUND ) 7.5 MG/0.5ML Pen, Inject 7.5 mg into the skin once a week.,  Disp: 2 mL, Rfl: 1   tirzepatide  (ZEPBOUND ) 7.5 MG/0.5ML Pen, Inject 7.5 mg into the skin once a week., Disp: 2 mL, Rfl: 1   tirzepatide  (ZEPBOUND ) 7.5 MG/0.5ML Pen, Inject 7.5 mg into the skin once a week., Disp: 2 mL, Rfl: 1   Physical Exam:   There were no vitals taken for this visit.  Pertinent Findings  CN II-XII intact Bilateral EAC clear and TM intact with well pneumatized middle ear spaces Anterior rhinoscopy: Septum midline; bilateral inferior turbinates with no hypertrophy  No lesions of oral cavity/oropharynx; dentition wnl No obviously palpable neck masses/lymphadenopathy/thyromegaly No respiratory distress or stridor  Seprately Identifiable Procedures:  None  Impression & Plans:  Melissa [name] is a 73 y.o. female with the following   Hearing loss-  Symmetric sensorineural hearing loss. Pt may benefit from hearing aids if she has significant issues, otherwise follow up with me PRN.    - f/u PRN   Thank you for allowing me the opportunity to care for your patient. Please do not hesitate to contact me should you have any other questions.  Sincerely, Chyrl Cohen PA-C [facility] Phone: [phone] Fax: [phone]  03/12/2024, 9:33 AM

## 2024-03-27 ENCOUNTER — Ambulatory Visit
Admission: RE | Admit: 2024-03-27 | Discharge: 2024-03-27 | Disposition: A | Discharge status: Home / Self Care | Source: Ambulatory Visit | Attending: Obstetrics and Gynecology | Admitting: Obstetrics and Gynecology

## 2024-03-27 DIAGNOSIS — Z1231 Encounter for screening mammogram for malignant neoplasm of breast: Secondary | ICD-10-CM

## 2024-04-06 ENCOUNTER — Other Ambulatory Visit (HOSPITAL_COMMUNITY): Payer: Self-pay

## 2024-04-10 ENCOUNTER — Other Ambulatory Visit (HOSPITAL_COMMUNITY): Payer: Self-pay

## 2024-04-10 DIAGNOSIS — Z23 Encounter for immunization: Secondary | ICD-10-CM | POA: Diagnosis not present

## 2024-04-10 DIAGNOSIS — R632 Polyphagia: Secondary | ICD-10-CM | POA: Diagnosis not present

## 2024-04-10 DIAGNOSIS — E7800 Pure hypercholesterolemia, unspecified: Secondary | ICD-10-CM | POA: Diagnosis not present

## 2024-04-10 DIAGNOSIS — Z6830 Body mass index (BMI) 30.0-30.9, adult: Secondary | ICD-10-CM | POA: Diagnosis not present

## 2024-04-10 DIAGNOSIS — R7303 Prediabetes: Secondary | ICD-10-CM | POA: Diagnosis not present

## 2024-04-10 DIAGNOSIS — E66811 Obesity, class 1: Secondary | ICD-10-CM | POA: Diagnosis not present

## 2024-04-10 MED ORDER — ZEPBOUND 5 MG/0.5ML ~~LOC~~ SOAJ
5.0000 mg | SUBCUTANEOUS | 1 refills | Status: AC
  Filled 2024-04-10: qty 2, 28d supply, fill #0
  Filled 2024-05-14: qty 2, 28d supply, fill #1

## 2024-04-11 ENCOUNTER — Other Ambulatory Visit (HOSPITAL_COMMUNITY): Payer: Self-pay

## 2024-04-13 ENCOUNTER — Other Ambulatory Visit: Payer: Self-pay

## 2024-04-13 ENCOUNTER — Other Ambulatory Visit (HOSPITAL_COMMUNITY): Payer: Self-pay

## 2024-04-27 ENCOUNTER — Other Ambulatory Visit (HOSPITAL_COMMUNITY): Payer: Self-pay

## 2024-04-27 DIAGNOSIS — L299 Pruritus, unspecified: Secondary | ICD-10-CM | POA: Diagnosis not present

## 2024-04-27 MED ORDER — BETAMETHASONE DIPROPIONATE AUG 0.05 % EX CREA
1.0000 | TOPICAL_CREAM | Freq: Two times a day (BID) | CUTANEOUS | 1 refills | Status: AC
  Filled 2024-04-27: qty 50, 30d supply, fill #0

## 2024-04-28 ENCOUNTER — Other Ambulatory Visit (HOSPITAL_COMMUNITY): Payer: Self-pay

## 2024-05-02 ENCOUNTER — Other Ambulatory Visit (HOSPITAL_BASED_OUTPATIENT_CLINIC_OR_DEPARTMENT_OTHER): Payer: Self-pay | Admitting: Cardiovascular Disease

## 2024-05-02 ENCOUNTER — Other Ambulatory Visit (HOSPITAL_COMMUNITY): Payer: Self-pay

## 2024-05-02 MED ORDER — ROSUVASTATIN CALCIUM 10 MG PO TABS
10.0000 mg | ORAL_TABLET | Freq: Every day | ORAL | 0 refills | Status: DC
  Filled 2024-05-02: qty 30, 30d supply, fill #0

## 2024-05-15 ENCOUNTER — Other Ambulatory Visit (HOSPITAL_BASED_OUTPATIENT_CLINIC_OR_DEPARTMENT_OTHER): Payer: Self-pay | Admitting: Internal Medicine

## 2024-05-15 ENCOUNTER — Other Ambulatory Visit (HOSPITAL_COMMUNITY): Payer: Self-pay

## 2024-05-15 DIAGNOSIS — M8589 Other specified disorders of bone density and structure, multiple sites: Secondary | ICD-10-CM

## 2024-05-16 ENCOUNTER — Other Ambulatory Visit (HOSPITAL_COMMUNITY): Payer: Self-pay

## 2024-06-05 ENCOUNTER — Other Ambulatory Visit (HOSPITAL_BASED_OUTPATIENT_CLINIC_OR_DEPARTMENT_OTHER): Payer: Self-pay | Admitting: Cardiovascular Disease

## 2024-06-05 ENCOUNTER — Other Ambulatory Visit (HOSPITAL_COMMUNITY): Payer: Self-pay

## 2024-06-05 MED ORDER — CEPHALEXIN 250 MG PO CAPS
250.0000 mg | ORAL_CAPSULE | ORAL | 3 refills | Status: AC
  Filled 2024-06-05: qty 40, 90d supply, fill #0

## 2024-06-07 ENCOUNTER — Other Ambulatory Visit (HOSPITAL_COMMUNITY): Payer: Self-pay

## 2024-06-07 MED ORDER — ROSUVASTATIN CALCIUM 10 MG PO TABS
10.0000 mg | ORAL_TABLET | Freq: Every day | ORAL | 0 refills | Status: DC
  Filled 2024-06-07: qty 30, 30d supply, fill #0

## 2024-06-11 ENCOUNTER — Other Ambulatory Visit (HOSPITAL_COMMUNITY): Payer: Self-pay

## 2024-06-11 ENCOUNTER — Other Ambulatory Visit: Payer: Self-pay

## 2024-06-11 DIAGNOSIS — Z6829 Body mass index (BMI) 29.0-29.9, adult: Secondary | ICD-10-CM | POA: Diagnosis not present

## 2024-06-11 DIAGNOSIS — R632 Polyphagia: Secondary | ICD-10-CM | POA: Diagnosis not present

## 2024-06-11 DIAGNOSIS — R7303 Prediabetes: Secondary | ICD-10-CM | POA: Diagnosis not present

## 2024-06-11 DIAGNOSIS — Z8639 Personal history of other endocrine, nutritional and metabolic disease: Secondary | ICD-10-CM | POA: Diagnosis not present

## 2024-06-11 DIAGNOSIS — E7800 Pure hypercholesterolemia, unspecified: Secondary | ICD-10-CM | POA: Diagnosis not present

## 2024-06-11 DIAGNOSIS — E663 Overweight: Secondary | ICD-10-CM | POA: Diagnosis not present

## 2024-06-11 MED ORDER — ZEPBOUND 5 MG/0.5ML ~~LOC~~ SOAJ
5.0000 mg | SUBCUTANEOUS | 1 refills | Status: DC
  Filled 2024-06-11 – 2024-06-14 (×2): qty 2, 28d supply, fill #0

## 2024-06-11 MED ORDER — ROSUVASTATIN CALCIUM 10 MG PO TABS
10.0000 mg | ORAL_TABLET | Freq: Every day | ORAL | 2 refills | Status: AC
  Filled 2024-06-11: qty 90, 90d supply, fill #0

## 2024-06-12 ENCOUNTER — Other Ambulatory Visit (HOSPITAL_COMMUNITY): Payer: Self-pay

## 2024-06-14 ENCOUNTER — Other Ambulatory Visit (HOSPITAL_COMMUNITY): Payer: Self-pay

## 2024-06-15 DIAGNOSIS — Z86018 Personal history of other benign neoplasm: Secondary | ICD-10-CM | POA: Diagnosis not present

## 2024-06-15 DIAGNOSIS — I2510 Atherosclerotic heart disease of native coronary artery without angina pectoris: Secondary | ICD-10-CM | POA: Diagnosis not present

## 2024-07-03 ENCOUNTER — Ambulatory Visit (INDEPENDENT_AMBULATORY_CARE_PROVIDER_SITE_OTHER): Admitting: Family

## 2024-07-03 ENCOUNTER — Encounter (HOSPITAL_BASED_OUTPATIENT_CLINIC_OR_DEPARTMENT_OTHER): Payer: Self-pay | Admitting: Family

## 2024-07-03 VITALS — BP 100/76 | HR 57 | Ht 65.0 in | Wt 175.7 lb

## 2024-07-03 DIAGNOSIS — I491 Atrial premature depolarization: Secondary | ICD-10-CM | POA: Diagnosis not present

## 2024-07-03 DIAGNOSIS — E785 Hyperlipidemia, unspecified: Secondary | ICD-10-CM

## 2024-07-03 DIAGNOSIS — I2510 Atherosclerotic heart disease of native coronary artery without angina pectoris: Secondary | ICD-10-CM

## 2024-07-03 DIAGNOSIS — R001 Bradycardia, unspecified: Secondary | ICD-10-CM | POA: Diagnosis not present

## 2024-07-03 DIAGNOSIS — R7303 Prediabetes: Secondary | ICD-10-CM | POA: Diagnosis not present

## 2024-07-03 NOTE — Progress Notes (Signed)
 Cardiology Office Note   Date:  07/03/2024  ID:  Melissa [name], Melissa [name] 1951/02/13, MRN [id number] PCP: Dwight Trula SQUIBB, MD  [facility] Providers Cardiologist:  Annabella Scarce, MD     History of Present Illness Melissa [name] is a 73 y.o. female with history of asymptomatic coronary calcification, hyperlipidemia, recurrent breast cancer s/p lumpectomy and XRT, prediabetes.   She has a strong family history of cardiovascular disease with father having heart attack in his 56s, several paternal uncles with heart attack in their 52s, brother with fatal MI.  Echo 06/2015 LVEF 55 to 60%, grade 1 diastolic dysfunction.  Exercise Cardiolite  06/2015 negative for ischemia able to achieve 8.5 METS.  Coronary CTA 09/2016 calcium  score of 5.8 placing her in the 59th percentile for age and gender with proximal LAD plaque.  Rosuvastatin  initiated.  Dr. Claudene had a knee replacement 06/2018 after which time rosuvastatin  reduced to every other day due to myalgias.  She underwent right total knee arthroplasty 07/2021.  She was started on Mounjaro  with successful weight loss though had to stop due to insurance coverage as she was not diabetic. Did not tolerate WEogvy.   She was last seen 05/16/2023 by Dr. Scarce.  She was doing well and staying active in the gym.  She noted lightheadedness if moving too quickly and planned to increase her fluid intake.  Presents today for follow up. Doing overall well since last seen. Does note occasional lightheadedness consistent with orthostasis if she does not hydrate well. No syncope. Exercising with a trainer twice per week for strength training and then cardio on her non-trainer days. Eats predominantly at home and does follow a low sodium diet. Tolerating Zepbound  without issue through [place]. Reports no palpitations, chest pain, exertional dyspnea.   Labs 01/25/2024  creatinine 1.1, GFR 59, AST 24, ALT 18, hemoglobin 13.3, A1c 5.6 Total cholesterol 154, HDL 72,  triglycerides 47, LDL 72  ROS: Please see the history of present illness.    All other systems reviewed and are negative.   Studies Reviewed EKG Interpretation Date/Time:  Tuesday July 03 2024 09:10:16 EST Ventricular Rate:  47 PR Interval:  140 QRS Duration:  82 QT Interval:  424 QTC Calculation: 375 R Axis:   -46  Text Interpretation: Sinus bradycardia with Premature atrial complexes in a pattern of bigeminy Left axis deviation Inferolateral TWI similar to previous Rhythm strip with SB 47-57 bpm with frequent PACs Reconfirmed by Vannie Mora ([id number]) on 07/03/2024 9:44:12 AM     Cardiac Studies & Procedures   ______________________________________________________________________________________________   STRESS TESTS  MYOCARDIAL PERFUSION IMAGING 07/16/2015  Interpretation Summary  The left ventricular ejection fraction is normal (55-65%).  Nuclear stress EF: 57%.  There was no ST segment deviation noted during stress.  This is a low risk study.  Low risk stress nuclear study with a small, mild, fixed apical defect consistent with soft tissue attenuation/apical thinning; no ischemia; EF 57 with normal wall motion.   ECHOCARDIOGRAM  ECHOCARDIOGRAM COMPLETE 07/08/2015  Narrative *[name]* [address] [place], [place] [address] [phone]  ------------------------------------------------------------------- Transthoracic Echocardiography  Patient:    Melissa [name], Melissa [name] MR #:       [id number] Study Date: 07/08/2015 Gender:     F Age:        39 Height:     167.6 cm Weight:     103.9 kg BSA:        2.24 m^2 Pt. Status: Room:  SONOGRAPHER  Celestia, Will  ATTENDING    Ezra Shuck, M.D. PERFORMING   [facility], Outpatient ORDERING     Annabella Scarce, MD REFERRING    Annabella Scarce, MD  cc:  ------------------------------------------------------------------- LV EF: 55% -    60%  ------------------------------------------------------------------- Indications:      (R06.02).  ------------------------------------------------------------------- History:   PMH:  Acquired from the patient and from the patient&'s chart.  Dyspnea.  Risk factors:  Obese.  ------------------------------------------------------------------- Study Conclusions  - Left ventricle: The cavity size was normal. Wall thickness was normal. Systolic function was normal. The estimated ejection fraction was in the range of 55% to 60%. Wall motion was normal; there were no regional wall motion abnormalities. Doppler parameters are consistent with abnormal left ventricular relaxation (grade 1 diastolic dysfunction). - Aortic valve: There was no stenosis. There was trivial regurgitation. - Aorta: Mildly dilated aortic root. Aortic root dimension: 38 mm (ED). - Mitral valve: There was mild regurgitation. - Right ventricle: The cavity size was normal. Systolic function was normal. - Tricuspid valve: Peak RV-RA gradient (S): 29 mm Hg. - Pulmonary arteries: PA peak pressure: 32 mm Hg (S). - Inferior vena cava: The vessel was normal in size. The respirophasic diameter changes were in the normal range (>= 50%), consistent with normal central venous pressure.  Impressions:  - Normal LV size with EF 55-60%. Normal RV size and systolic function. Mild mitral regurgitation and mild tricuspid regurgitation.  ------------------------------------------------------------------- Labs, prior tests, procedures, and surgery: ECG.     Abnormal. ECG.     Abnormal. Transthoracic echocardiography.  M-mode, complete 2D, spectral Doppler, and color Doppler.  Birthdate:  Patient birthdate: 15-Nov-1950.  Age:  Patient is 73 yr old.  Sex:  Gender: female. BMI: 37 kg/m^2.  Blood pressure:     108/76  Patient status: Outpatient.  Study date:  Study date: 07/08/2015. Study time: 09:59 AM.  Location:  [facility]  -------------------------------------------------------------------  ------------------------------------------------------------------- Left ventricle:  The cavity size was normal. Wall thickness was normal. Systolic function was normal. The estimated ejection fraction was in the range of 55% to 60%. Wall motion was normal; there were no regional wall motion abnormalities. Doppler parameters are consistent with abnormal left ventricular relaxation (grade 1 diastolic dysfunction).  ------------------------------------------------------------------- Aortic valve:   Trileaflet.  Doppler:   There was no stenosis. There was trivial regurgitation.  ------------------------------------------------------------------- Aorta:  Mildly dilated aortic root.  ------------------------------------------------------------------- Mitral valve:   Normal thickness leaflets .  Doppler:   There was no evidence for stenosis.   There was mild regurgitation.  ------------------------------------------------------------------- Left atrium:  The atrium was normal in size.  ------------------------------------------------------------------- Right ventricle:  The cavity size was normal. Systolic function was normal.  ------------------------------------------------------------------- Pulmonic valve:    Structurally normal valve.   Cusp separation was normal.  Doppler:  Transvalvular velocity was within the normal range. There was trivial regurgitation.  ------------------------------------------------------------------- Tricuspid valve:   Doppler:  There was mild regurgitation.  ------------------------------------------------------------------- Right atrium:  The atrium was normal in size.  ------------------------------------------------------------------- Pericardium:  There was no pericardial effusion.  ------------------------------------------------------------------- Systemic  veins: Inferior vena cava: The vessel was normal in size. The respirophasic diameter changes were in the normal range (>= 50%), consistent with normal central venous pressure.  ------------------------------------------------------------------- Post procedure conclusions Ascending Aorta:  - Mildly dilated aortic root.  ------------------------------------------------------------------- Measurements  Left ventricle                         Value  Reference LV ID, ED, PLAX chordal                51.1  mm     43 - 52 LV ID, ES, PLAX chordal                31.3  mm     23 - 38 LV fx shortening, PLAX chordal         39    %      >=29 LV PW thickness, ED                    9.96  mm     --------- IVS/LV PW ratio, ED                    1.21         <=1.3 Stroke volume, 2D                      64    ml     --------- Stroke volume/bsa, 2D                  29    ml/m^2 --------- LV e&', lateral                         8.38  cm/s   --------- LV E/e&', lateral                       7.83         --------- LV e&', medial                          5.26  cm/s   --------- LV E/e&', medial                        12.47        --------- LV e&', average                         6.82  cm/s   --------- LV E/e&', average                       9.62         ---------  Ventricular septum                     Value        Reference IVS thickness, ED                      12.1  mm     ---------  LVOT                                   Value        Reference LVOT ID, S                             22    mm     --------- LVOT area                              3.8   cm^2   ---------  LVOT ID                                22    mm     --------- LVOT peak velocity, S                  80.5  cm/s   --------- LVOT mean velocity, S                  51    cm/s   --------- LVOT VTI, S                            16.9  cm     --------- Stroke volume (SV), LVOT DP            64.2  ml     --------- Stroke index (SV/bsa),  LVOT DP         28.6  ml/m^2 ---------  Aorta                                  Value        Reference Aortic root ID, ED                     38    mm     --------- Ascending aorta ID, A-P, S             37    mm     ---------  Left atrium                            Value        Reference LA ID, A-P, ES                         38    mm     --------- LA ID/bsa, A-P                         1.69  cm/m^2 <=2.2 LA volume, S                           50    ml     --------- LA volume/bsa, S                       22.3  ml/m^2 --------- LA volume, ES, 1-p A4C                 48    ml     --------- LA volume/bsa, ES, 1-p A4C             21.4  ml/m^2 --------- LA volume, ES, 1-p A2C                 49    ml     --------- LA volume/bsa, ES, 1-p A2C             21.8  ml/m^2 ---------  Mitral valve                           Value  Reference Mitral E-wave peak velocity            65.6  cm/s   --------- Mitral A-wave peak velocity            71.1  cm/s   --------- Mitral deceleration time       (H)     271   ms     150 - 230 Mitral E/A ratio, peak                 0.9          ---------  Pulmonary arteries                     Value        Reference PA pressure, S, DP             (H)     32    mm Hg  <=30  Tricuspid valve                        Value        Reference Tricuspid regurg peak velocity         270   cm/s   --------- Tricuspid peak RV-RA gradient          29    mm Hg  ---------  Systemic veins                         Value        Reference Estimated CVP                          3     mm Hg  ---------  Right ventricle                        Value        Reference RV s&', lateral, S                      18.8  cm/s   ---------  Legend: (L)  and  (H)  mark values outside specified reference range.  ------------------------------------------------------------------- Prepared and Electronically Authenticated by  Ezra Shuck, M.D. 2016-12-13T12:31:48      CT SCANS  CT  CORONARY MORPH W/CTA COR W/SCORE 09/23/2016  Addendum 09/23/2016 11:48 AM ADDENDUM REPORT: 09/23/2016 11:46  CLINICAL DATA:  Chest pain  EXAM: Cardiac CTA  MEDICATIONS: Sub lingual nitro. 4mg  and lopressor  5mg  IV x 1.  TECHNIQUE: The patient was scanned on a Philips 256 slice scanner. Gantry rotation speed was 270 msecs. Collimation was .9mm. A 100 kV prospective scan was triggered in the descending thoracic aorta at 111 HU's with 5% padding centered around 78% of the R-R interval. Average HR during the scan was 65 bpm. The 3D data set was interpreted on a dedicated work station using MPR, MIP and VRT modes. A total of 80 cc of contrast was used.  FINDINGS: Non-cardiac: See separate report from [facility].  Calcium  Score:  5.8 Agatston units  Coronary Arteries: Right dominant with no anomalies  LM:  No significant disease.  LAD system: There is mixed plaque in the proximal LAD without significant stenosis.  Circumflex system:  No significant disease.  RCA:  No significant disease.  IMPRESSION: 1. Coronary artery calcium  score 5.8 Agatston units. This places the patient in the 59th  percentile for age and gender. This suggests intermediate risk for future cardiac events.  2. Mixed plaque in proximal LAD without significant stenosis. No other coronary disease noted.  Dalton Mclean   Electronically Signed By: Ezra Shuck M.D. On: 09/23/2016 11:46  Narrative EXAM: OVER-READ INTERPRETATION  CT CHEST  The following report is an over-read performed by radiologist Dr. Toribio Cove [facility] on 09/23/2016. This over-read does not include interpretation of cardiac or coronary anatomy or pathology. The coronary calcium  score/coronary CTA interpretation by the cardiologist is attached.  COMPARISON:  Chest CT 01/25/2012.  FINDINGS: Tiny pulmonary nodules are noted in lungs bilaterally, the largest of which measures only 3 mm in the right  lower lobe (image 25 of series 204). Within the visualized portions of the thorax there are no larger more suspicious appearing pulmonary nodules or masses, there is no acute consolidative airspace disease, no pleural effusions, no pneumothorax and no lymphadenopathy. Visualized portions of the upper abdomen are remarkable for several well-defined low-attenuation lesions in the liver, incompletely characterized on today's examination, but similar to prior studies, most compatible with cysts. There are no aggressive appearing lytic or blastic lesions noted in the visualized portions of the skeleton.  IMPRESSION: 1. Two tiny pulmonary nodules in the lungs bilaterally, largest of which measures only 3 mm. These are nonspecific, but statistically likely benign. No follow-up needed if patient is low-risk (and has no known or suspected primary neoplasm). Non-contrast chest CT can be considered in 12 months if patient is high-risk. This recommendation follows the consensus statement: Guidelines for Management of Incidental Pulmonary Nodules Detected on CT Images: From the Fleischner Society 2017; Radiology 2017; 284:228-243.  Electronically Signed: By: Toribio Aye M.D. On: 09/23/2016 10:48     ______________________________________________________________________________________________      Risk Assessment/Calculations           Physical Exam VS:  BP 100/76 (BP Location: Right Arm, Patient Position: Sitting, Cuff Size: Large)   Pulse (!) 57   Ht 5' 5 (1.651 m)   Wt 175 lb 11.2 oz (79.7 kg)   SpO2 99%   BMI 29.24 kg/m        Wt Readings from Last 3 Encounters:  07/03/24 175 lb 11.2 oz (79.7 kg)  07/07/23 185 lb (83.9 kg)  05/16/23 184 lb 9.6 oz (83.7 kg)    GEN: Well nourished, well developed in no acute distress NECK: No JVD; No carotid bruits CARDIAC: RRR, no murmurs, rubs, gallops RESPIRATORY:  Clear to auscultation without rales, wheezing or rhonchi  ABDOMEN:  Soft, non-tender, non-distended EXTREMITIES:  No edema; No deformity   ASSESSMENT AND PLAN  CAD - CCTA 2021 with calcium  score 5.8 (59th percentile) with mixed plaque in proximal LAD without significant stenosis. Stable with no anginal symptoms. No indication for ischemic evaluation.  GDMT Aspirin 81mg  daily, Rosuvastatin  10mg  daily. No beta blocker due to bradycardia. EKG today SB 47-57 bpm with stable inferolateral TWI. Recommend aiming for 150 minutes of moderate intensity activity per week and following a heart healthy diet.    Sinus bradycardia / PAC - EKG today SB 47-57 bpm on rhythm strip with frequent PAC. Reports no palpitations. Her occasional lightheadedness is consistent with orthostasis and increased hydration encouraged. If she notes symptomatic palpitations or increased severity/frequency of lightheadedness increases consider ZIO monitor.   HLD, LDL goal <70 - present regimen rosuvastatin  10mg  daily. 04/2024 LDL 72. Update lipid panel and Lp(a) today. Consider escalation of therapy (increased dose Rosuvastatin  vs addition of PCSK9i)  pending result.   Prediabetes / Obesity - Following with [facility]. Tolerating Zepbound  well. Exercising regularly with personal trainer 2x per week and cardio on other days.        Dispo: follow up in 1 year  Signed, Reche GORMAN Finder, NP

## 2024-07-03 NOTE — Patient Instructions (Signed)
 Medication Instructions:  Continue your current medications.   *If you need a refill on your cardiac medications before your next appointment, please call your pharmacy*  Lab Work: Your physician recommends that you return for lab work today: lipid panel  If you have labs (blood work) drawn today and your tests are completely normal, you will receive your results only by: MyChart Message (if you have MyChart) OR A paper copy in the mail If you have any lab test that is abnormal or we need to change your treatment, we will call you to review the results.  Testing/Procedures: Your EKG today showed sinus bradycardia with an occasional   Follow-Up: At [facility], you and your health needs are our priority.  As part of our continuing mission to provide you with exceptional heart care, our providers are all part of one team.  This team includes your primary Cardiologist (physician) and Advanced Practice Providers or APPs (Physician Assistants and Nurse Practitioners) who all work together to provide you with the care you need, when you need it.  Your next appointment:   1 year(s)  Provider:   Annabella Scarce, MD or Reche Finder, NP    We recommend signing up for the patient portal called MyChart.  Sign up information is provided on this After Visit Summary.  MyChart is used to connect with patients for Virtual Visits (Telemedicine).  Patients are able to view lab/test results, encounter notes, upcoming appointments, etc.  Non-urgent messages can be sent to your provider as well.   To learn more about what you can do with MyChart, go to [email or web address].   Other Instructions  Heart Healthy Diet Recommendations: A low-salt diet is recommended. Meats should be grilled, baked, or boiled. Avoid fried foods. Focus on lean protein sources like fish or chicken with vegetables and fruits. The American Heart Association is a Chief Technology Officer!  American Heart Association Diet and  Lifeystyle Recommendations   Exercise recommendations: The American Heart Association recommends 150 minutes of moderate intensity exercise weekly. Try 30 minutes of moderate intensity exercise 4-5 times per week. This could include walking, jogging, or swimming.

## 2024-07-10 ENCOUNTER — Inpatient Hospital Stay: Payer: Medicare Other | Attending: Oncology | Admitting: Oncology

## 2024-07-10 VITALS — BP 116/87 | HR 67 | Temp 97.4°F | Resp 18 | Ht 65.0 in | Wt 177.7 lb

## 2024-07-10 DIAGNOSIS — Z08 Encounter for follow-up examination after completed treatment for malignant neoplasm: Secondary | ICD-10-CM | POA: Diagnosis present

## 2024-07-10 DIAGNOSIS — Z853 Personal history of malignant neoplasm of breast: Secondary | ICD-10-CM | POA: Insufficient documentation

## 2024-07-10 DIAGNOSIS — Z9221 Personal history of antineoplastic chemotherapy: Secondary | ICD-10-CM | POA: Insufficient documentation

## 2024-07-10 DIAGNOSIS — C50912 Malignant neoplasm of unspecified site of left female breast: Secondary | ICD-10-CM

## 2024-07-10 NOTE — Progress Notes (Signed)
 [facility] OFFICE PROGRESS NOTE   Diagnosis: Breast cancer  INTERVAL HISTORY:   Melissa [name] returns as scheduled.  She feels well.  She reports intentional weight loss with Zepbound .  No change over the chest wall or either breast.  She reports pruritus at the left upper back for the past several months.  She saw Dr. Rexanne and was prescribed Diprolene  cream.  She uses this intermittently.  Objective:  Vital signs in last 24 hours:  Blood pressure 116/87, pulse 67, temperature (!) 97.4 F (36.3 C), temperature source Temporal, resp. rate 18, height 5' 5 (1.651 m), weight 177 lb 11.2 oz (80.6 kg), SpO2 100%.   Lymphatics: No cervical, supraclavicular, or axillary nodes Resp: Lungs clear bilaterally Cardio: Regular rate and rhythm with an occasional pause GI: No hepatosplenomegaly, nontender Vascular: Leg edema Breast: No mass in either breast.  Left lumpectomy.  Both axillae appear benign. Skin: Fine dry slightly raised rash at the left upper back and left upper posterior arm, similar and less prominent changes at the right upper back/arm  Lab Results:  Lab Results  Component Value Date   WBC 8.6 08/25/2021   HGB 11.6 (L) 08/25/2021   HCT 36.4 08/25/2021   MCV 85.6 08/25/2021   PLT 197 08/25/2021   NEUTROABS 2.2 12/09/2020    CMP  Lab Results  Component Value Date   NA 134 (L) 08/25/2021   K 4.2 08/25/2021   CL 104 08/25/2021   CO2 23 08/25/2021   GLUCOSE 129 (H) 08/25/2021   BUN 12 08/25/2021   CREATININE 0.70 08/25/2021   CALCIUM  8.7 (L) 08/25/2021   PROT 7.4 08/11/2021   ALBUMIN 4.3 08/11/2021   AST 22 08/11/2021   ALT 20 08/11/2021   ALKPHOS 82 08/11/2021   BILITOT 1.0 08/11/2021   GFRNONAA >60 08/25/2021   GFRAA 59 (L) 04/03/2019      Medications: I have reviewed the patient's current medications.   Assessment/Plan: Stage I (T1 N0) left-sided breast cancer diagnosed in January of 1999, ER positive, PR positive, HER-2 negative, status  post a left lumpectomy, left axillary lymph node dissection, and left breast radiation. She completed 5 years of adjuvant tamoxifen therapy in January of 2004.   2. Recurrent invasive breast cancer near the left lumpectomy scar-confirmed on a needle core biopsy 01/05/2012, the pathology is consistent with invasive breast cancer, ER positive, PR positive, HER-2 negative. ? Local recurrence versus a new breast primary   -breast MRI 01/17/2012 confirmed an isolated mass in the upper outer left breast   -bone scan on 01/19/2012-negative aside from an area of very subtle uptake in the inferior sternum without a CT correlate   -staging CTs of the chest, abdomen, and pelvis on 01/19/2012-negative for metastatic disease   -Partial mastectomy 02/14/2012 confirmed a 1.5 cm grade 2 invasive carcinoma with associated DCIS and negative surgical margins   -Oncotype recurrence score-24   -Initiation of Femara  after an office visit on 03/09/2012   -Femara  discontinued September 2023 3. Tiny cutaneous nodular lesion overlying the left clavicle when she was here on 03/09/2012  4. Knee Arthralgias-most likely related to degenerative arthritis-status post left total knee arthroplasty 07/10/2018 5. Pea-sized  nodular lesion near the left axillary scar-not palpated today    Disposition: Melissa [name] remains in remission from breast cancer.  She continues yearly mammography.  She would like to continue follow-up in the oncology clinic.  She will return for an office visit in 1 year.  We will refer her  to dermatology to evaluate the pruritus and dry rash at the upper back.  Arley Hof, MD  07/10/2024  10:57 AM

## 2024-08-02 ENCOUNTER — Other Ambulatory Visit (HOSPITAL_COMMUNITY): Payer: Self-pay

## 2024-08-02 MED ORDER — ZEPBOUND 5 MG/0.5ML ~~LOC~~ SOAJ
5.0000 mg | SUBCUTANEOUS | 0 refills | Status: AC
  Filled 2024-08-02 – 2024-08-06 (×2): qty 2, 28d supply, fill #0

## 2024-08-06 ENCOUNTER — Encounter: Payer: Self-pay | Admitting: Dermatology

## 2024-08-06 ENCOUNTER — Ambulatory Visit: Admitting: Dermatology

## 2024-08-06 ENCOUNTER — Other Ambulatory Visit (HOSPITAL_COMMUNITY): Payer: Self-pay

## 2024-08-06 ENCOUNTER — Other Ambulatory Visit: Payer: Self-pay

## 2024-08-06 VITALS — BP 101/72

## 2024-08-06 DIAGNOSIS — R202 Paresthesia of skin: Secondary | ICD-10-CM | POA: Diagnosis not present

## 2024-08-06 DIAGNOSIS — L603 Nail dystrophy: Secondary | ICD-10-CM | POA: Diagnosis not present

## 2024-08-06 MED ORDER — SAFETY SEAL MISCELLANEOUS MISC: 5 refills | Status: AC

## 2024-08-06 MED ORDER — SAFETY SEAL MISCELLANEOUS MISC: 5 refills | Status: DC

## 2024-08-06 NOTE — Patient Instructions (Addendum)
 "  VISIT SUMMARY:  Melissa [name], a 74 year old female with a history of breast cancer, visited us  today due to chronic itching on the left side of her back. The itching, initially severe, has been somewhat managed with betamethasone  cream but still persists.  YOUR PLAN:  -NOTALGIA PARESTHETICA:  Notalgia paresthetica is a condition characterized by chronic itching on the back, often due to nerve irritation. This can be caused by factors such as previous radiation therapy and age-related changes in the spine.   We are switching your treatment to a compounded topical medication containing gabapentin , lidocaine , and ketoprofen, to be applied twice daily. This should help manage the itching more effectively without the side effects associated with long-term use of betamethasone  cream. If this treatment does not provide sufficient relief, we may consider Namluvio, a monoclonal antibody, as a long-term solution.  NAIL DYSTHROPHY (SECONDARY TO TRAUMA): -While the nail may not grow back normally, we can try to stimulate growth by applying [employer]on Nail conditioner daily.  You will massage this in for 5-10 minutes every evening.  Also taking daily colagen supplement could also promote growth.  I recommend the vital protein collagen powder in your coffee daily.   INSTRUCTIONS:  Please apply the compounded topical medication containing gabapentin , lidocaine , and ketoprofen twice daily as directed. If you do not experience sufficient relief, contact our office to discuss the possibility of starting Nemluvio. Follow up in 8 weeks  Your provider has sent your prescription to [facility] in [place], [place] . A pharmacy representative will call you to confirm details and take your payment information. If you do not receive a call within 24 hours, please contact the pharmacy at [phone] or [phone]. Your unique skincare compound is being formulated in our lab (most compounds take less than 24  hours). Your prescription is shipped vis USPS to your mailbox (2-4 business days). Priority shipping is available at an additional cost. Once received, you will electronically sign/acknowledge that you received your prescription. The pharmacy hours are Monday-Friday 9 am-6 pm EST and Saturday 9 am-1 pm EST.          Important Information  Due to recent changes in healthcare laws, you may see results of your pathology and/or laboratory studies on MyChart before the doctors have had a chance to review them. We understand that in some cases there may be results that are confusing or concerning to you. Please understand that not all results are received at the same time and often the doctors may need to interpret multiple results in order to provide you with the best plan of care or course of treatment. Therefore, we ask that you please give us  2 business days to thoroughly review all your results before contacting the office for clarification. Should we see a critical lab result, you will be contacted sooner.   If You Need Anything After Your Visit  If you have any questions or concerns for your doctor, please call our main line at [phone] If no one answers, please leave a voicemail as directed and we will return your call as soon as possible. Messages left after 4 pm will be answered the following business day.   You may also send us  a message via MyChart. We typically respond to MyChart messages within 1-2 business days.  For prescription refills, please ask your pharmacy to contact our office. Our fax number is [phone].  If you have an urgent issue when the clinic is closed that cannot wait until the next  business day, you can page your doctor at the number below.    Please note that while we do our best to be available for urgent issues outside of office hours, we are not available 24/7.   If you have an urgent issue and are unable to reach us , you may choose to seek medical care  at your doctor's office, retail clinic, urgent care center, or emergency room.  If you have a medical emergency, please immediately call 911 or go to the emergency department. In the event of inclement weather, please call our main line at [phone] for an update on the status of any delays or closures.  Dermatology Medication Tips: Please keep the boxes that topical medications come in in order to help keep track of the instructions about where and how to use these. Pharmacies typically print the medication instructions only on the boxes and not directly on the medication tubes.   If your medication is too expensive, please contact our office at [phone] or send us  a message through MyChart.   We are unable to tell what your co-pay for medications will be in advance as this is different depending on your insurance coverage. However, we may be able to find a substitute medication at lower cost or fill out paperwork to get insurance to cover a needed medication.   If a prior authorization is required to get your medication covered by your insurance company, please allow us  1-2 business days to complete this process.  Drug prices often vary depending on where the prescription is filled and some pharmacies may offer cheaper prices.  The website www.goodrx.com contains coupons for medications through different pharmacies. The prices here do not account for what the cost may be with help from insurance (it may be cheaper with your insurance), but the website can give you the price if you did not use any insurance.  - You can print the associated coupon and take it with your prescription to the pharmacy.  - You may also stop by our office during regular business hours and pick up a GoodRx coupon card.  - If you need your prescription sent electronically to a different pharmacy, notify our office through [facility] or by phone at [phone]     "

## 2024-08-06 NOTE — Progress Notes (Signed)
" ° °  New Patient Visit   Subjective  Melissa [name] is a 74 y.o. female who presents for a NEW PATIENT appointment to be examined for the concerns as listed below.   Rash: Pt stated that she has a rash at the L side of her back that presented 02/2024. She seen her PCP in 04/2024 regard that Rx betamethasone  that she has been applying BID since it was given. She stated that she is still having itching even though the rash has resolved - rating itch 5-10/10 depending on the day. She stated her husband says it just looks like dry skin. She uses sensitive skin body washes and laundry detergents.   Pt mentioned that she is a 2x breast cancer survivor. Tx was completed in 2023.    Reports Hx of Bx - benign.  Denied family Hx of skin cancer.   The following portions of the chart were reviewed this encounter and updated as appropriate: medications, allergies, medical history  Review of Systems:  No other skin or systemic complaints except as noted in HPI or Assessment and Plan.  Objective  Well appearing patient in no apparent distress; mood and affect are within normal limits.   A focused examination was performed of the following areas: back   Relevant exam findings are noted in the Assessment and Plan.        Assessment & Plan   Notalgia paresthetica Chronic itching on the left side of the back, likely due to a combination of radiation therapy and age-related intervertebral space changes causing sensory nerve pressure. Betamethasone  cream has reduced itching from 10/10 to 4-5/10 but is not ideal for daily use due to potential side effects such as atrophy and striae.  - Switched to a compounded topical medication containing gabapentin , lidocaine , and ketoprofen to be applied twice daily. - Provided information from [facility] regarding the compounded medication. - Will consider Nemluvio, a monoclonal antibody targeting H receptors, as a long-term solution if topical treatment is  ineffective.  NAIL DYSTROPHY Exam: dystrophic thumb nail per patient but not visible today under polish/nail tip  Treatment Plan: - Recommended Elon nail conditioner - Take Vital Protein Collagen daily NOTALGIA PARESTHETICA   This Visit - Safety Seal Miscellaneous MISC - Post Neuralgia cream with gabapentin  USP 4% lidocaine  USP 2% apply to affected area BID  Return in about 8 weeks (around 10/01/2024) for Notalgia Paresthetica.   Documentation: I have reviewed the above documentation for accuracy and completeness, and I agree with the above.  I, Shirron Maranda, CMA II, am acting as scribe for:   Delon Lenis, DO     "

## 2024-08-07 ENCOUNTER — Other Ambulatory Visit (HOSPITAL_COMMUNITY): Payer: Self-pay

## 2024-08-23 ENCOUNTER — Other Ambulatory Visit (HOSPITAL_COMMUNITY): Payer: Self-pay

## 2024-08-24 ENCOUNTER — Other Ambulatory Visit (HOSPITAL_COMMUNITY): Payer: Self-pay

## 2024-08-24 MED ORDER — ZEPBOUND 5 MG/0.5ML ~~LOC~~ SOAJ
5.0000 mg | SUBCUTANEOUS | 2 refills | Status: AC
  Filled 2024-08-24: qty 2, 28d supply, fill #0

## 2024-10-02 ENCOUNTER — Ambulatory Visit: Admitting: Dermatology

## 2025-07-11 ENCOUNTER — Inpatient Hospital Stay: Admitting: Oncology
